# Patient Record
Sex: Male | Born: 1955 | Hispanic: No | Marital: Married | Smoking: Never smoker
Health system: Southern US, Community
[De-identification: ages and names within clinical notes are randomized; demographics above are authoritative.]

## PROBLEM LIST (undated history)

## (undated) DIAGNOSIS — S62109A Fracture of unspecified carpal bone, unspecified wrist, initial encounter for closed fracture: Secondary | ICD-10-CM

## (undated) DIAGNOSIS — Z955 Presence of coronary angioplasty implant and graft: Secondary | ICD-10-CM

## (undated) HISTORY — DX: Fracture of unspecified carpal bone, unspecified wrist, initial encounter for closed fracture: S62.109A

## (undated) HISTORY — DX: Presence of coronary angioplasty implant and graft: Z95.5

---

## 2008-03-21 ENCOUNTER — Encounter: Admission: RE | Admit: 2008-03-21 | Discharge: 2008-04-23 | Payer: Self-pay | Admitting: Neurology

## 2009-01-22 ENCOUNTER — Emergency Department (HOSPITAL_BASED_OUTPATIENT_CLINIC_OR_DEPARTMENT_OTHER): Admission: EM | Admit: 2009-01-22 | Discharge: 2009-01-22 | Payer: Self-pay | Admitting: Emergency Medicine

## 2009-01-22 ENCOUNTER — Ambulatory Visit: Payer: Self-pay | Admitting: Diagnostic Radiology

## 2009-01-22 IMAGING — CT CT ABD-PELV W/ CM
2 of 5 series · 17 of 46 positions shown, 19 images · IV contrast (APPLIED)
Comparison: None

CLINICAL DATA: Left-sided abdominal pain.

CT ABDOMEN AND PELVIS WITH CONTRAST
TECHNIQUE: Multidetector CT imaging of the abdomen and pelvis was
performed following the standard protocol during bolus
administration of intravenous contrast.
Contrast: 100 ml [48]

[Series 2: abd/pelvis 5.0 b31f · axial · 0.77mm/px · z∈[+848,+1298]mm · 14 of 102 slices shown, 16 images]
[im 6/102  soft-tissue]
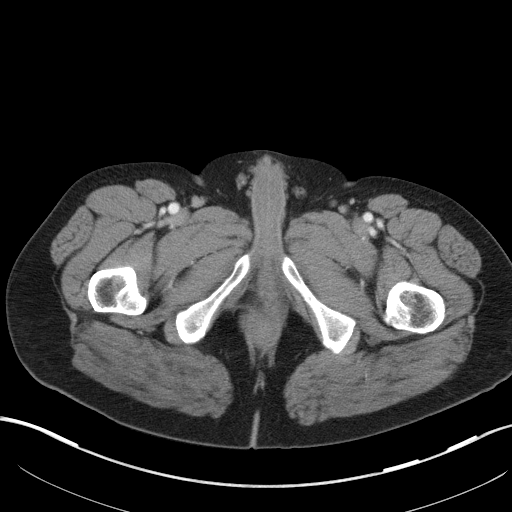
[im 6/102  bone]
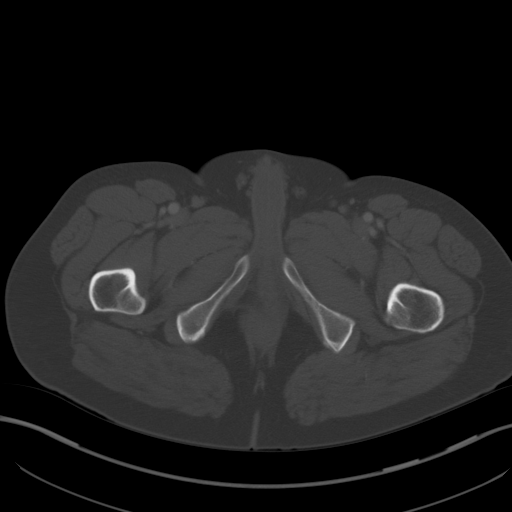
[im 12/102  soft-tissue]
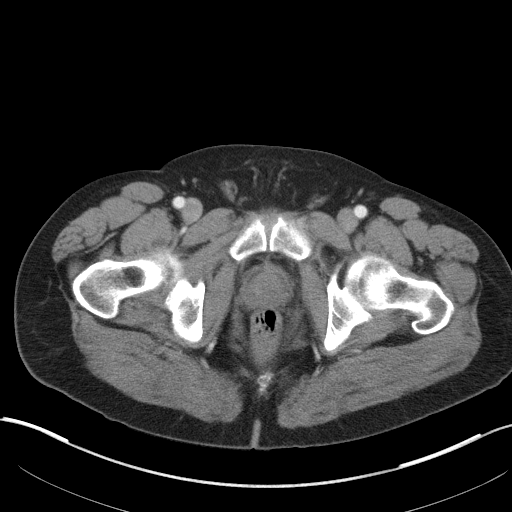
[im 23/102  soft-tissue]
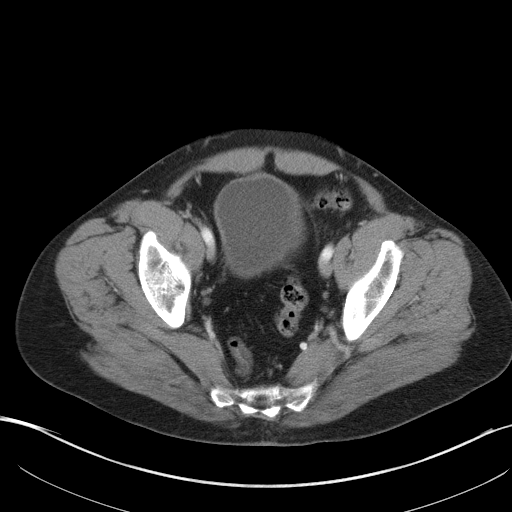
[im 29/102  soft-tissue]
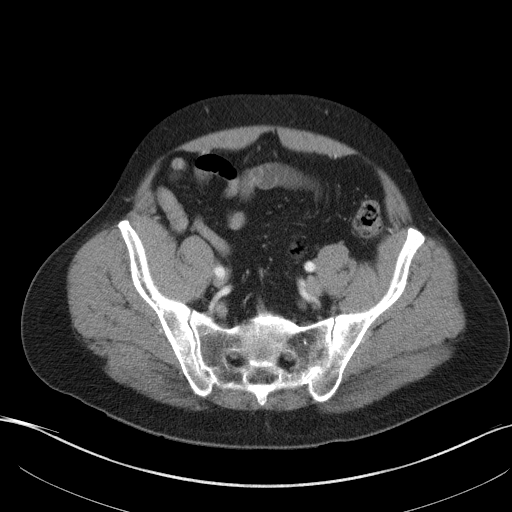
[im 34/102  soft-tissue]
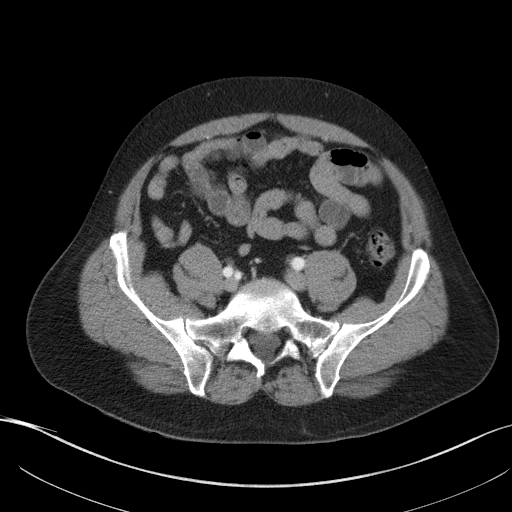
[im 40/102  soft-tissue]
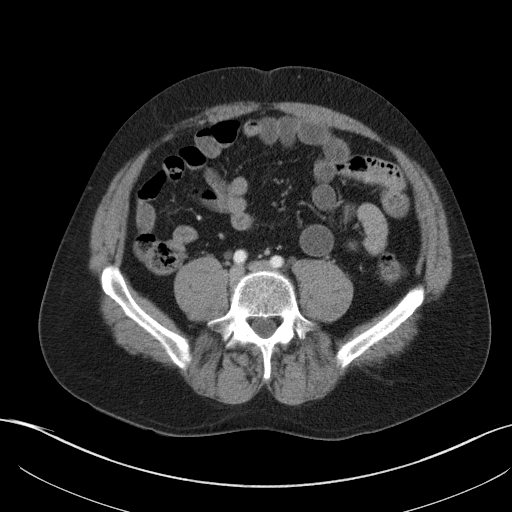
[im 45/102  soft-tissue]
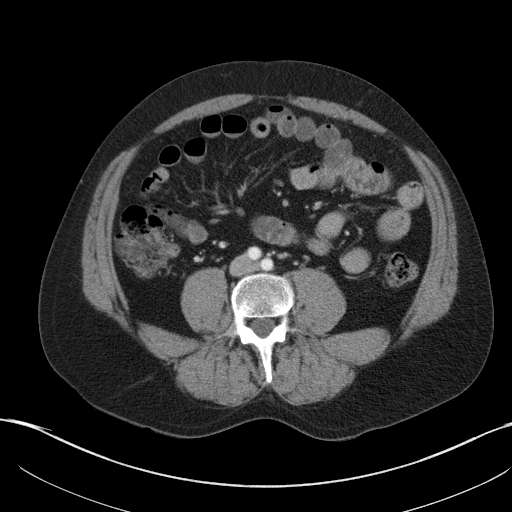
[im 57/102  soft-tissue]
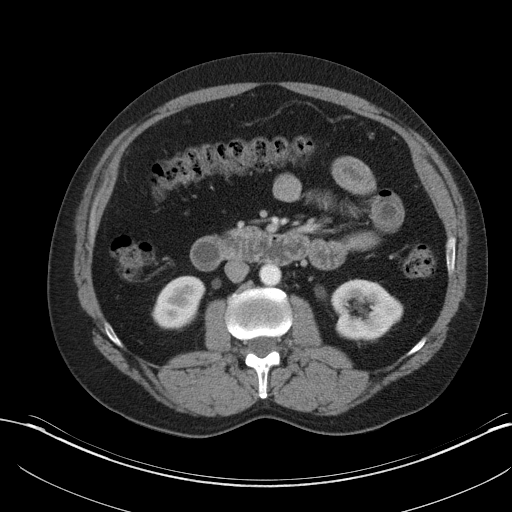
[im 62/102  soft-tissue]
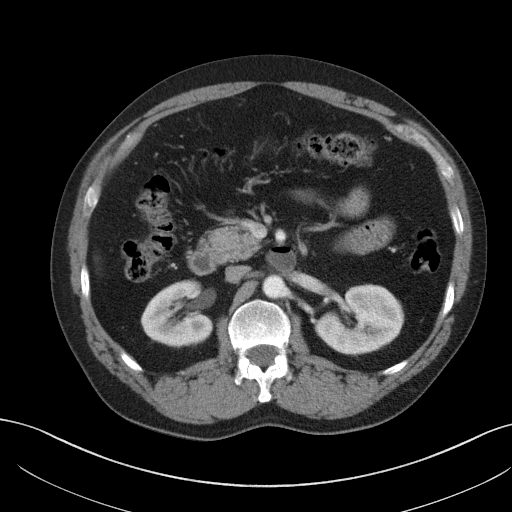
[im 62/102  bone]
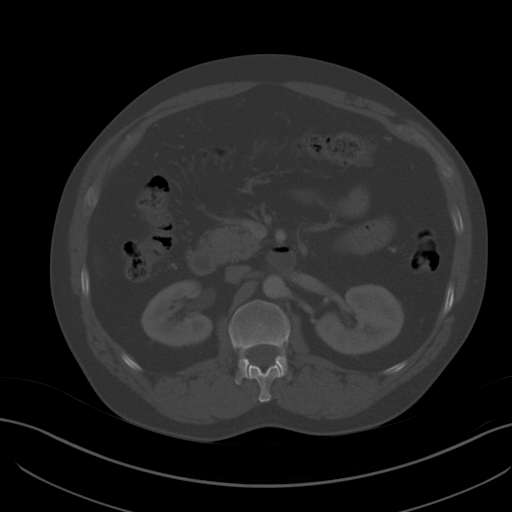
[im 68/102  soft-tissue]
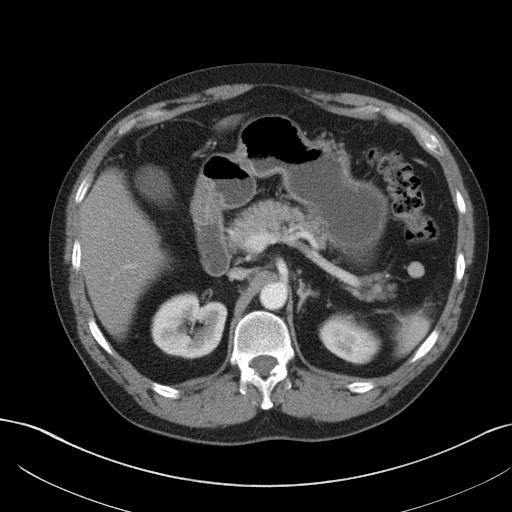
[im 73/102  soft-tissue]
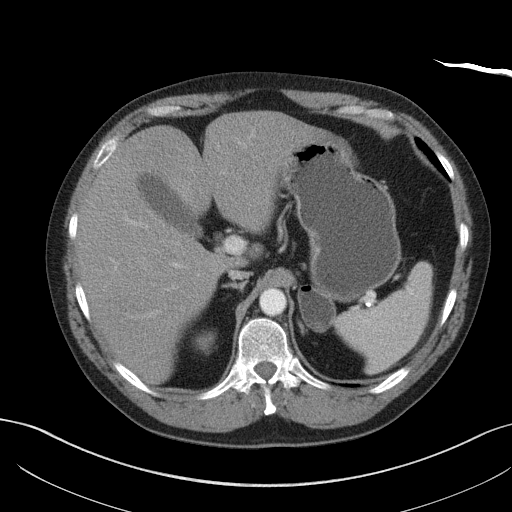
[im 79/102  soft-tissue]
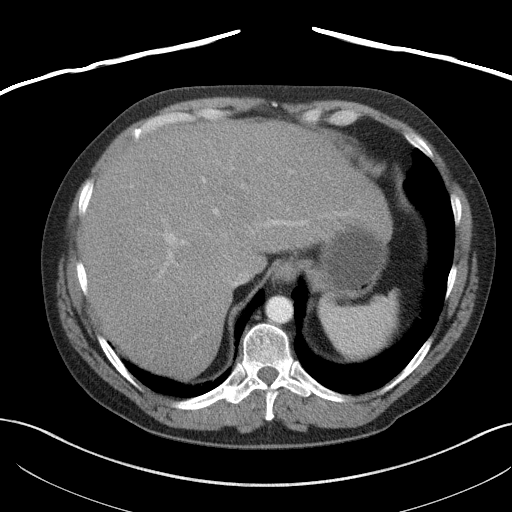
[im 90/102  soft-tissue]
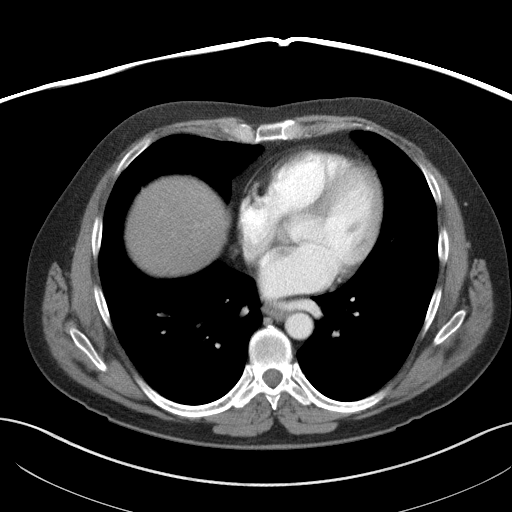
[im 96/102  soft-tissue]
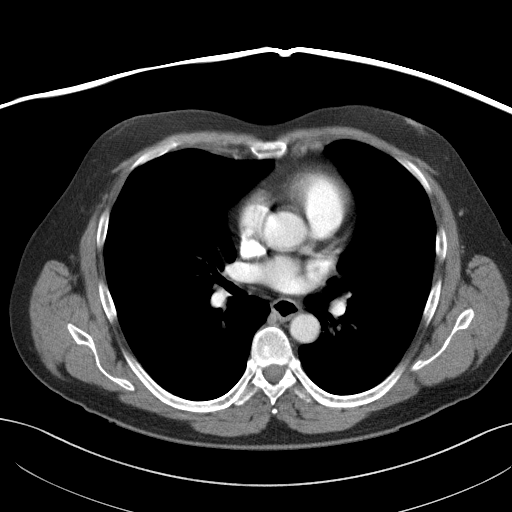

[Series 5: abd/pelvis 3.0 coronal · coronal · 0.91mm/px · 3 of 93 slices shown]
[im 31/93  soft-tissue]
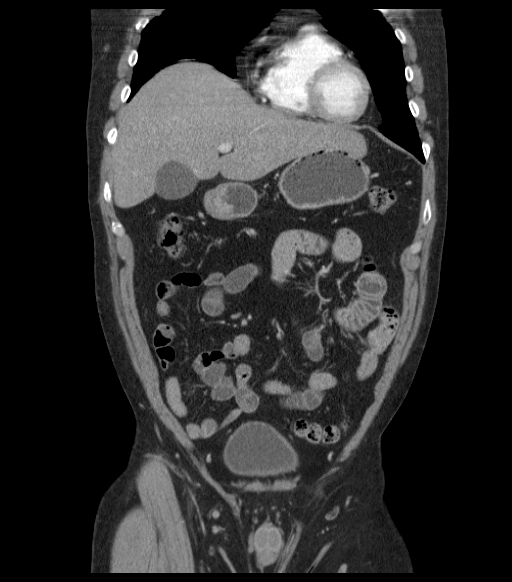
[im 41/93  soft-tissue]
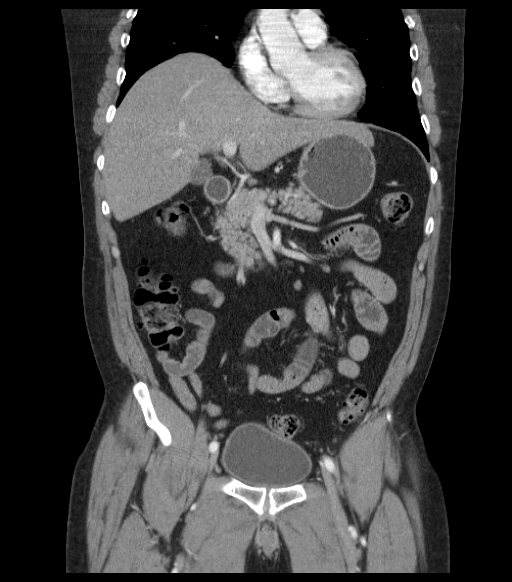
[im 52/93  soft-tissue]
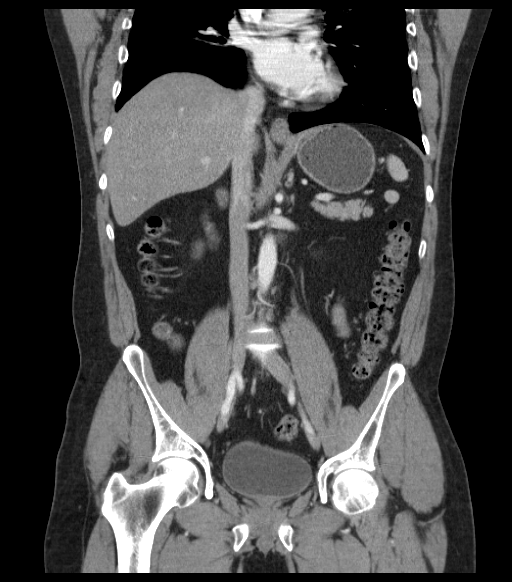

[17 of 46 positions shown; findings below may reference images not displayed]

FINDINGS: The liver, spleen, pancreas, adrenal glands, and kidneys
are normal.  There is no diverticular disease, free air, free
fluid, adenopathy, or bony abnormality.  No free fluid in the
pelvis.  The terminal ileum and appendix are normal.  There are no
renal or ureteral calculi.
IMPRESSION: Normal CT scan of the abdomen and pelvis.

## 2010-04-06 LAB — URINALYSIS, ROUTINE W REFLEX MICROSCOPIC
Glucose, UA: NEGATIVE mg/dL
Ketones, ur: NEGATIVE mg/dL
Nitrite: NEGATIVE
Specific Gravity, Urine: 1.003 — ABNORMAL LOW (ref 1.005–1.030)
pH: 5.5 (ref 5.0–8.0)

## 2010-04-06 LAB — BASIC METABOLIC PANEL
CO2: 21 mEq/L (ref 19–32)
Calcium: 9 mg/dL (ref 8.4–10.5)
Chloride: 105 mEq/L (ref 96–112)
Glucose, Bld: 80 mg/dL (ref 70–99)
Sodium: 139 mEq/L (ref 135–145)

## 2010-04-06 LAB — DIFFERENTIAL
Eosinophils Absolute: 0.2 10*3/uL (ref 0.0–0.7)
Eosinophils Relative: 2 % (ref 0–5)
Lymphs Abs: 1.9 10*3/uL (ref 0.7–4.0)
Monocytes Relative: 8 % (ref 3–12)

## 2010-04-06 LAB — CBC
HCT: 43.6 % (ref 39.0–52.0)
Platelets: 213 10*3/uL (ref 150–400)
RDW: 12.2 % (ref 11.5–15.5)

## 2015-01-08 DIAGNOSIS — N529 Male erectile dysfunction, unspecified: Secondary | ICD-10-CM | POA: Insufficient documentation

## 2015-01-08 DIAGNOSIS — D709 Neutropenia, unspecified: Secondary | ICD-10-CM | POA: Insufficient documentation

## 2015-01-08 DIAGNOSIS — I10 Essential (primary) hypertension: Secondary | ICD-10-CM | POA: Diagnosis present

## 2015-01-08 HISTORY — DX: Essential (primary) hypertension: I10

## 2016-10-13 DIAGNOSIS — R7303 Prediabetes: Secondary | ICD-10-CM | POA: Insufficient documentation

## 2017-09-21 DIAGNOSIS — E782 Mixed hyperlipidemia: Secondary | ICD-10-CM

## 2017-09-21 HISTORY — DX: Mixed hyperlipidemia: E78.2

## 2020-07-18 ENCOUNTER — Inpatient Hospital Stay (HOSPITAL_COMMUNITY)
Admission: EM | Admit: 2020-07-18 | Discharge: 2020-07-30 | DRG: 640 | Disposition: A | Discharge status: Home / Self Care | Payer: 59 | Attending: Family Medicine | Admitting: Family Medicine

## 2020-07-18 DIAGNOSIS — F1010 Alcohol abuse, uncomplicated: Secondary | ICD-10-CM | POA: Diagnosis present

## 2020-07-18 DIAGNOSIS — E782 Mixed hyperlipidemia: Secondary | ICD-10-CM

## 2020-07-18 DIAGNOSIS — K31811 Angiodysplasia of stomach and duodenum with bleeding: Secondary | ICD-10-CM | POA: Diagnosis present

## 2020-07-18 DIAGNOSIS — Z20822 Contact with and (suspected) exposure to covid-19: Secondary | ICD-10-CM | POA: Diagnosis present

## 2020-07-18 DIAGNOSIS — Z79899 Other long term (current) drug therapy: Secondary | ICD-10-CM

## 2020-07-18 DIAGNOSIS — D689 Coagulation defect, unspecified: Secondary | ICD-10-CM

## 2020-07-18 DIAGNOSIS — E871 Hypo-osmolality and hyponatremia: Principal | ICD-10-CM

## 2020-07-18 DIAGNOSIS — R945 Abnormal results of liver function studies: Secondary | ICD-10-CM

## 2020-07-18 DIAGNOSIS — D509 Iron deficiency anemia, unspecified: Secondary | ICD-10-CM | POA: Diagnosis present

## 2020-07-18 DIAGNOSIS — K7460 Unspecified cirrhosis of liver: Secondary | ICD-10-CM

## 2020-07-18 DIAGNOSIS — R7989 Other specified abnormal findings of blood chemistry: Secondary | ICD-10-CM

## 2020-07-18 DIAGNOSIS — K766 Portal hypertension: Secondary | ICD-10-CM | POA: Diagnosis present

## 2020-07-18 DIAGNOSIS — K317 Polyp of stomach and duodenum: Secondary | ICD-10-CM | POA: Diagnosis present

## 2020-07-18 DIAGNOSIS — K219 Gastro-esophageal reflux disease without esophagitis: Secondary | ICD-10-CM | POA: Diagnosis present

## 2020-07-18 DIAGNOSIS — E876 Hypokalemia: Secondary | ICD-10-CM | POA: Diagnosis not present

## 2020-07-18 DIAGNOSIS — I10 Essential (primary) hypertension: Secondary | ICD-10-CM | POA: Diagnosis present

## 2020-07-18 DIAGNOSIS — K3189 Other diseases of stomach and duodenum: Secondary | ICD-10-CM

## 2020-07-18 DIAGNOSIS — F411 Generalized anxiety disorder: Secondary | ICD-10-CM | POA: Diagnosis present

## 2020-07-18 DIAGNOSIS — D649 Anemia, unspecified: Secondary | ICD-10-CM

## 2020-07-18 DIAGNOSIS — E669 Obesity, unspecified: Secondary | ICD-10-CM | POA: Diagnosis present

## 2020-07-18 DIAGNOSIS — K7010 Alcoholic hepatitis without ascites: Secondary | ICD-10-CM | POA: Diagnosis present

## 2020-07-18 DIAGNOSIS — B0229 Other postherpetic nervous system involvement: Secondary | ICD-10-CM | POA: Diagnosis present

## 2020-07-18 DIAGNOSIS — K7200 Acute and subacute hepatic failure without coma: Secondary | ICD-10-CM | POA: Diagnosis present

## 2020-07-18 DIAGNOSIS — R932 Abnormal findings on diagnostic imaging of liver and biliary tract: Secondary | ICD-10-CM

## 2020-07-18 DIAGNOSIS — D500 Iron deficiency anemia secondary to blood loss (chronic): Secondary | ICD-10-CM | POA: Diagnosis present

## 2020-07-18 DIAGNOSIS — N400 Enlarged prostate without lower urinary tract symptoms: Secondary | ICD-10-CM | POA: Diagnosis present

## 2020-07-18 DIAGNOSIS — K7030 Alcoholic cirrhosis of liver without ascites: Secondary | ICD-10-CM

## 2020-07-18 DIAGNOSIS — E872 Acidosis: Secondary | ICD-10-CM | POA: Diagnosis present

## 2020-07-18 DIAGNOSIS — F17290 Nicotine dependence, other tobacco product, uncomplicated: Secondary | ICD-10-CM | POA: Diagnosis present

## 2020-07-18 DIAGNOSIS — Z6831 Body mass index (BMI) 31.0-31.9, adult: Secondary | ICD-10-CM

## 2020-07-18 LAB — ABO/RH: ABO/RH(D): O NEG

## 2020-07-18 LAB — COMPREHENSIVE METABOLIC PANEL
ALT: 350 U/L — ABNORMAL HIGH (ref 0–44)
AST: 572 U/L — ABNORMAL HIGH (ref 15–41)
Albumin: 2.8 g/dL — ABNORMAL LOW (ref 3.5–5.0)
Alkaline Phosphatase: 166 U/L — ABNORMAL HIGH (ref 38–126)
Anion gap: 11 (ref 5–15)
BUN: 7 mg/dL — ABNORMAL LOW (ref 8–23)
CO2: 18 mmol/L — ABNORMAL LOW (ref 22–32)
Calcium: 7.8 mg/dL — ABNORMAL LOW (ref 8.9–10.3)
Chloride: 90 mmol/L — ABNORMAL LOW (ref 98–111)
Creatinine, Ser: 0.87 mg/dL (ref 0.61–1.24)
GFR, Estimated: >60 mL/min (ref >60)
Glucose, Bld: 126 mg/dL — ABNORMAL HIGH (ref 70–99)
Potassium: 3 mmol/L — ABNORMAL LOW (ref 3.5–5.1)
Sodium: 119 mmol/L — CL (ref 135–145)
Total Bilirubin: 2 mg/dL — ABNORMAL HIGH (ref 0.3–1.2)
Total Protein: 6.1 g/dL — ABNORMAL LOW (ref 6.5–8.1)

## 2020-07-18 LAB — PREPARE RBC (CROSSMATCH)

## 2020-07-18 LAB — CBC
HCT: 22.5 % — ABNORMAL LOW (ref 39.0–52.0)
Hemoglobin: 6.6 g/dL — CL (ref 13.0–17.0)
MCH: 20.9 pg — ABNORMAL LOW (ref 26.0–34.0)
MCHC: 29.3 g/dL — ABNORMAL LOW (ref 30.0–36.0)
MCV: 71.2 fL — ABNORMAL LOW (ref 80.0–100.0)
Platelets: 154 10*3/uL (ref 150–400)
RBC: 3.16 MIL/uL — ABNORMAL LOW (ref 4.22–5.81)
RDW: 17.4 % — ABNORMAL HIGH (ref 11.5–15.5)
WBC: 6.1 10*3/uL (ref 4.0–10.5)
nRBC: 0 % (ref 0.0–0.2)

## 2020-07-18 LAB — POC OCCULT BLOOD, ED: Fecal Occult Bld: NEGATIVE

## 2020-07-18 MED ORDER — POTASSIUM CHLORIDE CRYS ER 20 MEQ PO TBCR
40.0000 meq | EXTENDED_RELEASE_TABLET | Freq: Once | ORAL | Status: AC
  Administered 2020-07-18: 40 meq via ORAL
  Filled 2020-07-18: qty 2

## 2020-07-18 MED ORDER — SODIUM CHLORIDE 0.9 % IV SOLN
10.0000 mL/h | Freq: Once | INTRAVENOUS | Status: AC
  Administered 2020-07-19: 10 mL/h via INTRAVENOUS

## 2020-07-18 NOTE — ED Provider Notes (Signed)
[facility] EMERGENCY DEPARTMENT Provider Note   CSN: [id number] Arrival date & time: 07/18/20  1943     History Chief Complaint  Patient presents with   Abnormal Lab    Kristopher Ramos is a 65 y.o. male.  The history is provided by the patient.  Abnormal Lab He had blood drawn from his physician's office today and was called because his hemoglobin is 6.6.  He has been having weakness, orthostatic dizziness, exertional dyspnea over the last month which has been getting worse.  Symptoms started after he had an episode of shingles.  He denies chest pain, heaviness, tightness, pressure.  He has had some anorexia and nausea but has not had any weight loss.  He denies any dark stools.  He denies NSAID use but he is a heavy drinker admitting to 6 beers a day.  He denies fever, chills, sweats.  He takes omeprazole and citalopram.  He had been on medication for blood pressure, but stopped taking it when he checked his blood pressure at home recently and it was 90 and he checked it the next day was 120.   No past medical history on file.  There are no problems to display for this patient.   ** The histories are not reviewed yet. Please review them in the "History" navigator section and refresh this [place].     No family history on file.     Home Medications Prior to Admission medications   Not on File    Allergies    Patient has no allergy information on record.  Review of Systems   Review of Systems  All other systems reviewed and are negative.  Physical Exam Updated Vital Signs BP 124/61 (BP Location: Left Arm)   Pulse 83   Temp 98.7 F (37.1 C) (Oral)   Resp (!) 22   SpO2 100%   Physical Exam Vitals and nursing note reviewed.  65 year old male, resting comfortably and in no acute distress. Vital signs are significant for slightly elevated respiratory rate. Oxygen saturation is 100%, which is normal. Head is normocephalic and atraumatic. PERRLA,  EOMI. Oropharynx is clear.  Conjunctivae are pale. Neck is nontender and supple without adenopathy or JVD. Back is nontender and there is no CVA tenderness. Lungs are clear without rales, wheezes, or rhonchi. Chest is nontender. Heart has regular rate and rhythm without murmur. Abdomen is soft, flat, nontender without masses or hepatosplenomegaly and peristalsis is normoactive. Rectal: Normal sphincter tone, small amount of light brown stool which is Hemoccult negative. Extremities have no cyanosis or edema, full range of motion is present. Skin is warm and dry without rash. Neurologic: Mental status is normal, cranial nerves are intact, there are no motor or sensory deficits.  ED Results / Procedures / Treatments   Labs (all labs ordered are listed, but only abnormal results are displayed) Labs Reviewed  COMPREHENSIVE METABOLIC PANEL - Abnormal; Notable for the following components:      Result Value   Sodium 119 (*)    Potassium 3.0 (*)    Chloride 90 (*)    CO2 18 (*)    Glucose, Bld 126 (*)    BUN 7 (*)    Calcium 7.8 (*)    Total Protein 6.1 (*)    Albumin 2.8 (*)    AST 572 (*)    ALT 350 (*)    Alkaline Phosphatase 166 (*)    Total Bilirubin 2.0 (*)    All other components  within normal limits  CBC - Abnormal; Notable for the following components:   RBC 3.16 (*)    Hemoglobin 6.6 (*)    HCT 22.5 (*)    MCV 71.2 (*)    MCH 20.9 (*)    MCHC 29.3 (*)    RDW 17.4 (*)    All other components within normal limits  RETICULOCYTES - Abnormal; Notable for the following components:   RBC. 3.18 (*)    Immature Retic Fract 23.0 (*)    All other components within normal limits  SARS CORONAVIRUS 2 (TAT 6-24 HRS)  VITAMIN B12  FOLATE  IRON AND TIBC  FERRITIN  HEPATITIS PANEL, ACUTE  POC OCCULT BLOOD, ED  TYPE AND SCREEN  ABO/RH  PREPARE RBC (CROSSMATCH)    EKG EKG Interpretation  Date/Time:  Thursday July 18 2020 23:26:02 EDT Ventricular Rate:  80 PR  Interval:  160 QRS Duration: 107 QT Interval:  426 QTC Calculation: 492 R Axis:   4 Text Interpretation: Sinus rhythm Atrial premature complexes Borderline prolonged QT interval No significant change was found Confirmed by Delora Fuel ([id number]) on 07/19/2020 12:21:15 AM  Radiology No results found.  Procedures Procedures  CRITICAL CARE Performed by: Delora Fuel Total critical care time: 60 minutes Critical care time was exclusive of separately billable procedures and treating other patients. Critical care was necessary to treat or prevent imminent or life-threatening deterioration. Critical care was time spent personally by me on the following activities: development of treatment plan with patient and/or surrogate as well as nursing, discussions with consultants, evaluation of patient's response to treatment, examination of patient, obtaining history from patient or surrogate, ordering and performing treatments and interventions, ordering and review of laboratory studies, ordering and review of radiographic studies, pulse oximetry and re-evaluation of patient's condition.  Medications Ordered in ED Medications - No data to display  ED Course  I have reviewed the triage vital signs and the nursing notes.  Pertinent labs & imaging results that were available during my care of the patient were reviewed by me and considered in my medical decision making (see chart for details).   MDM Rules/Calculators/A&P                         Symptomatic anemia.  Labs drawn at triage to confirm hemoglobin of 6.6 with microcytic and hyperchromic indices suggesting iron deficiency.  However, there are significant abnormalities on his metabolic panel as well.  Sodium is 119, alkaline phosphatase 166, AST 572, ALT 350, bilirubin 2.0.  Last prior labs in our system or from more than 10 years ago.  However, in care everywhere, it is noted he had a hemoglobin of 13.0 on 01/22/2020.  Prior to that, hemoglobin had been  15.3 on 09/21/2017.  He has had mild hyponatremia with sodium 128-131 going back to 2018.  He has also had mild elevation of transaminases going back to 2018, but nothing higher than 74.  Blood is ordered for transfusion and also for anemia panel.  Hepatitis panel was drawn.  ECG shows borderline prolonged QT interval.  Case is discussed with Dr. Cyd Silence of [facility], who agrees to admit the patient.  Final Clinical Impression(s) / ED Diagnoses Final diagnoses:  Symptomatic anemia  Elevated liver function tests  Hyponatremia  Hypokalemia    Rx / DC Orders ED Discharge Orders     None        Delora Fuel, MD 18/84/16 [phone]

## 2020-07-18 NOTE — ED Triage Notes (Signed)
Pt c/o dizziness, lightheaded, "problems seeing," states Hgn was 6.6 at PCP this AM, but unsure of source. Endorses nausea, no additional complaints. States he feels "crummy"

## 2020-07-18 NOTE — ED Triage Notes (Signed)
Pt stated dx w shingles approx 7wks ago, symptoms of low Hgn started approx 4wks ago.

## 2020-07-19 ENCOUNTER — Other Ambulatory Visit: Payer: Self-pay

## 2020-07-19 ENCOUNTER — Inpatient Hospital Stay (HOSPITAL_COMMUNITY): Payer: 59

## 2020-07-19 ENCOUNTER — Encounter (HOSPITAL_COMMUNITY)
Admission: EM | Disposition: A | Discharge status: Home / Self Care | Payer: Self-pay | Source: Home / Self Care | Attending: Family Medicine

## 2020-07-19 ENCOUNTER — Inpatient Hospital Stay (HOSPITAL_COMMUNITY): Payer: 59 | Admitting: Certified Registered Nurse Anesthetist

## 2020-07-19 ENCOUNTER — Encounter (HOSPITAL_COMMUNITY): Payer: Self-pay | Admitting: Internal Medicine

## 2020-07-19 DIAGNOSIS — Z79899 Other long term (current) drug therapy: Secondary | ICD-10-CM | POA: Diagnosis not present

## 2020-07-19 DIAGNOSIS — F1010 Alcohol abuse, uncomplicated: Secondary | ICD-10-CM | POA: Diagnosis present

## 2020-07-19 DIAGNOSIS — D689 Coagulation defect, unspecified: Secondary | ICD-10-CM | POA: Diagnosis not present

## 2020-07-19 DIAGNOSIS — R932 Abnormal findings on diagnostic imaging of liver and biliary tract: Secondary | ICD-10-CM | POA: Diagnosis not present

## 2020-07-19 DIAGNOSIS — K7030 Alcoholic cirrhosis of liver without ascites: Secondary | ICD-10-CM | POA: Diagnosis present

## 2020-07-19 DIAGNOSIS — R945 Abnormal results of liver function studies: Secondary | ICD-10-CM | POA: Diagnosis not present

## 2020-07-19 DIAGNOSIS — I10 Essential (primary) hypertension: Secondary | ICD-10-CM | POA: Diagnosis present

## 2020-07-19 DIAGNOSIS — D500 Iron deficiency anemia secondary to blood loss (chronic): Secondary | ICD-10-CM | POA: Diagnosis present

## 2020-07-19 DIAGNOSIS — B0229 Other postherpetic nervous system involvement: Secondary | ICD-10-CM | POA: Diagnosis present

## 2020-07-19 DIAGNOSIS — E782 Mixed hyperlipidemia: Secondary | ICD-10-CM | POA: Diagnosis present

## 2020-07-19 DIAGNOSIS — Z6831 Body mass index (BMI) 31.0-31.9, adult: Secondary | ICD-10-CM | POA: Diagnosis not present

## 2020-07-19 DIAGNOSIS — K219 Gastro-esophageal reflux disease without esophagitis: Secondary | ICD-10-CM

## 2020-07-19 DIAGNOSIS — D649 Anemia, unspecified: Secondary | ICD-10-CM

## 2020-07-19 DIAGNOSIS — D509 Iron deficiency anemia, unspecified: Secondary | ICD-10-CM | POA: Diagnosis present

## 2020-07-19 DIAGNOSIS — E871 Hypo-osmolality and hyponatremia: Principal | ICD-10-CM | POA: Diagnosis present

## 2020-07-19 DIAGNOSIS — E872 Acidosis: Secondary | ICD-10-CM | POA: Diagnosis present

## 2020-07-19 DIAGNOSIS — K31819 Angiodysplasia of stomach and duodenum without bleeding: Secondary | ICD-10-CM | POA: Diagnosis not present

## 2020-07-19 DIAGNOSIS — K317 Polyp of stomach and duodenum: Secondary | ICD-10-CM

## 2020-07-19 DIAGNOSIS — N400 Enlarged prostate without lower urinary tract symptoms: Secondary | ICD-10-CM | POA: Diagnosis present

## 2020-07-19 DIAGNOSIS — R7989 Other specified abnormal findings of blood chemistry: Secondary | ICD-10-CM | POA: Diagnosis not present

## 2020-07-19 DIAGNOSIS — K3189 Other diseases of stomach and duodenum: Secondary | ICD-10-CM

## 2020-07-19 DIAGNOSIS — Z20822 Contact with and (suspected) exposure to covid-19: Secondary | ICD-10-CM | POA: Diagnosis present

## 2020-07-19 DIAGNOSIS — E876 Hypokalemia: Secondary | ICD-10-CM | POA: Diagnosis present

## 2020-07-19 DIAGNOSIS — K31811 Angiodysplasia of stomach and duodenum with bleeding: Secondary | ICD-10-CM | POA: Diagnosis present

## 2020-07-19 DIAGNOSIS — K7010 Alcoholic hepatitis without ascites: Secondary | ICD-10-CM | POA: Diagnosis not present

## 2020-07-19 DIAGNOSIS — F411 Generalized anxiety disorder: Secondary | ICD-10-CM | POA: Diagnosis present

## 2020-07-19 DIAGNOSIS — K766 Portal hypertension: Secondary | ICD-10-CM | POA: Diagnosis present

## 2020-07-19 DIAGNOSIS — F17290 Nicotine dependence, other tobacco product, uncomplicated: Secondary | ICD-10-CM | POA: Diagnosis present

## 2020-07-19 DIAGNOSIS — K7200 Acute and subacute hepatic failure without coma: Secondary | ICD-10-CM | POA: Diagnosis present

## 2020-07-19 DIAGNOSIS — E669 Obesity, unspecified: Secondary | ICD-10-CM | POA: Diagnosis present

## 2020-07-19 HISTORY — DX: Alcohol abuse, uncomplicated: F10.10

## 2020-07-19 HISTORY — DX: Gastro-esophageal reflux disease without esophagitis: K21.9

## 2020-07-19 HISTORY — PX: GI RADIOFREQUENCY ABLATION: SHX6807

## 2020-07-19 HISTORY — PX: ESOPHAGOGASTRODUODENOSCOPY (EGD) WITH PROPOFOL: SHX5813

## 2020-07-19 HISTORY — PX: POLYPECTOMY: SHX5525

## 2020-07-19 HISTORY — DX: Generalized anxiety disorder: F41.1

## 2020-07-19 HISTORY — PX: BIOPSY: SHX5522

## 2020-07-19 LAB — TSH: TSH: 4.825 u[IU]/mL — ABNORMAL HIGH (ref 0.350–4.500)

## 2020-07-19 LAB — BASIC METABOLIC PANEL
Anion gap: 9 (ref 5–15)
BUN: 7 mg/dL — ABNORMAL LOW (ref 8–23)
CO2: 22 mmol/L (ref 22–32)
Calcium: 8.5 mg/dL — ABNORMAL LOW (ref 8.9–10.3)
Chloride: 101 mmol/L (ref 98–111)
Creatinine, Ser: 0.8 mg/dL (ref 0.61–1.24)
GFR, Estimated: >60 mL/min (ref >60)
Glucose, Bld: 122 mg/dL — ABNORMAL HIGH (ref 70–99)
Potassium: 3.9 mmol/L (ref 3.5–5.1)
Sodium: 132 mmol/L — ABNORMAL LOW (ref 135–145)

## 2020-07-19 LAB — CBC
HCT: 26.7 % — ABNORMAL LOW (ref 39.0–52.0)
HCT: 29 % — ABNORMAL LOW (ref 39.0–52.0)
Hemoglobin: 8.4 g/dL — ABNORMAL LOW (ref 13.0–17.0)
Hemoglobin: 9.2 g/dL — ABNORMAL LOW (ref 13.0–17.0)
MCH: 23.1 pg — ABNORMAL LOW (ref 26.0–34.0)
MCH: 23.3 pg — ABNORMAL LOW (ref 26.0–34.0)
MCHC: 31.5 g/dL (ref 30.0–36.0)
MCHC: 31.7 g/dL (ref 30.0–36.0)
MCV: 73.6 fL — ABNORMAL LOW (ref 80.0–100.0)
MCV: 73.4 fL — ABNORMAL LOW (ref 80.0–100.0)
Platelets: 147 10*3/uL — ABNORMAL LOW (ref 150–400)
Platelets: 161 10*3/uL (ref 150–400)
RBC: 3.63 MIL/uL — ABNORMAL LOW (ref 4.22–5.81)
RBC: 3.95 MIL/uL — ABNORMAL LOW (ref 4.22–5.81)
RDW: 19.6 % — ABNORMAL HIGH (ref 11.5–15.5)
RDW: 19.1 % — ABNORMAL HIGH (ref 11.5–15.5)
WBC: 6.1 10*3/uL (ref 4.0–10.5)
WBC: 6 10*3/uL (ref 4.0–10.5)
nRBC: 0 % (ref 0.0–0.2)
nRBC: 0 % (ref 0.0–0.2)

## 2020-07-19 LAB — COMPREHENSIVE METABOLIC PANEL
ALT: 356 U/L — ABNORMAL HIGH (ref 0–44)
AST: 582 U/L — ABNORMAL HIGH (ref 15–41)
Albumin: 2.8 g/dL — ABNORMAL LOW (ref 3.5–5.0)
Alkaline Phosphatase: 164 U/L — ABNORMAL HIGH (ref 38–126)
Anion gap: 9 (ref 5–15)
BUN: 7 mg/dL — ABNORMAL LOW (ref 8–23)
CO2: 21 mmol/L — ABNORMAL LOW (ref 22–32)
Calcium: 8.3 mg/dL — ABNORMAL LOW (ref 8.9–10.3)
Chloride: 97 mmol/L — ABNORMAL LOW (ref 98–111)
Creatinine, Ser: 0.76 mg/dL (ref 0.61–1.24)
GFR, Estimated: >60 mL/min (ref >60)
Glucose, Bld: 120 mg/dL — ABNORMAL HIGH (ref 70–99)
Potassium: 3.8 mmol/L (ref 3.5–5.1)
Sodium: 127 mmol/L — ABNORMAL LOW (ref 135–145)
Total Bilirubin: 3.2 mg/dL — ABNORMAL HIGH (ref 0.3–1.2)
Total Protein: 6.1 g/dL — ABNORMAL LOW (ref 6.5–8.1)

## 2020-07-19 LAB — FOLATE: Folate: 21.3 ng/mL (ref >5.9)

## 2020-07-19 LAB — HIV ANTIBODY (ROUTINE TESTING W REFLEX): HIV Screen 4th Generation wRfx: NONREACTIVE

## 2020-07-19 LAB — HEPATITIS PANEL, ACUTE
HCV Ab: NONREACTIVE
Hep A IgM: NONREACTIVE
Hep B C IgM: NONREACTIVE
Hepatitis B Surface Ag: NONREACTIVE

## 2020-07-19 LAB — IRON AND TIBC
Iron: 13 ug/dL — ABNORMAL LOW (ref 45–182)
Saturation Ratios: 3 % — ABNORMAL LOW (ref 17.9–39.5)
TIBC: 407 ug/dL (ref 250–450)
UIBC: 394 ug/dL

## 2020-07-19 LAB — RETICULOCYTES
Immature Retic Fract: 23 % — ABNORMAL HIGH (ref 2.3–15.9)
RBC.: 3.18 MIL/uL — ABNORMAL LOW (ref 4.22–5.81)
Retic Count, Absolute: 65.8 10*3/uL (ref 19.0–186.0)
Retic Ct Pct: 2.1 % (ref 0.4–3.1)

## 2020-07-19 LAB — FERRITIN: Ferritin: 22 ng/mL — ABNORMAL LOW (ref 24–336)

## 2020-07-19 LAB — VITAMIN B12: Vitamin B-12: 1478 pg/mL — ABNORMAL HIGH (ref 180–914)

## 2020-07-19 LAB — APTT: aPTT: 34 seconds (ref 24–36)

## 2020-07-19 LAB — OSMOLALITY, URINE: Osmolality, Ur: 191 mOsm/kg — ABNORMAL LOW (ref 300–900)

## 2020-07-19 LAB — PROTIME-INR
INR: 1.4 — ABNORMAL HIGH (ref 0.8–1.2)
Prothrombin Time: 17.1 seconds — ABNORMAL HIGH (ref 11.4–15.2)

## 2020-07-19 LAB — SODIUM, URINE, RANDOM: Sodium, Ur: <10 mmol/L

## 2020-07-19 LAB — SARS CORONAVIRUS 2 (TAT 6-24 HRS): SARS Coronavirus 2: NEGATIVE

## 2020-07-19 LAB — OSMOLALITY: Osmolality: 268 mOsm/kg — ABNORMAL LOW (ref 275–295)

## 2020-07-19 IMAGING — US US ABDOMEN LIMITED
1 series · 14 of 25 positions shown · non-contrast
Comparison: None.

CLINICAL DATA: Cirrhosis

EXAM:
ULTRASOUND ABDOMEN LIMITED RIGHT UPPER QUADRANT

[Series 1: us abdomen limited ruq (liver/gb) · 14 of 44 slices shown]
[im 1/44]
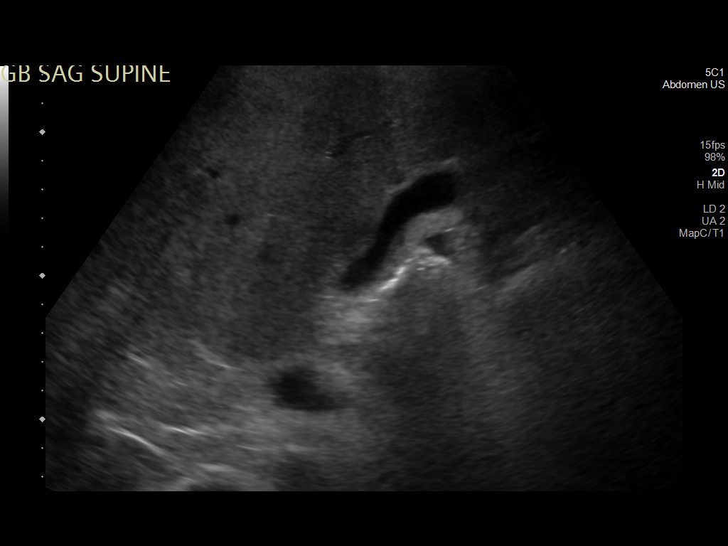
[im 4/44]
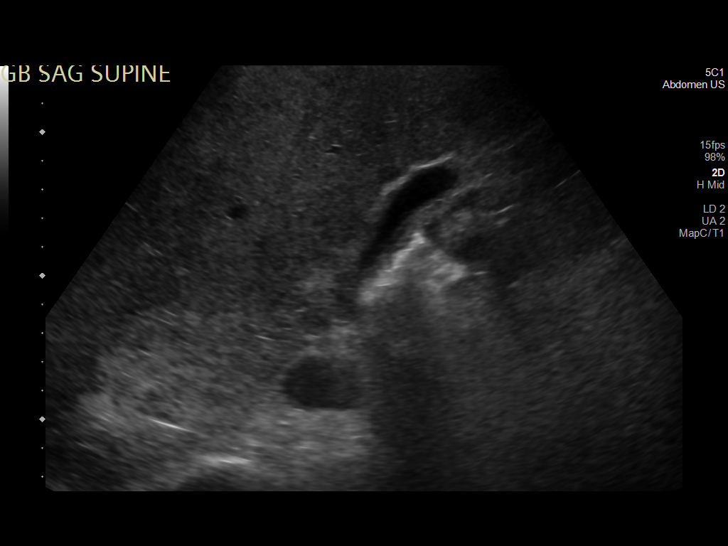
[im 8/44]
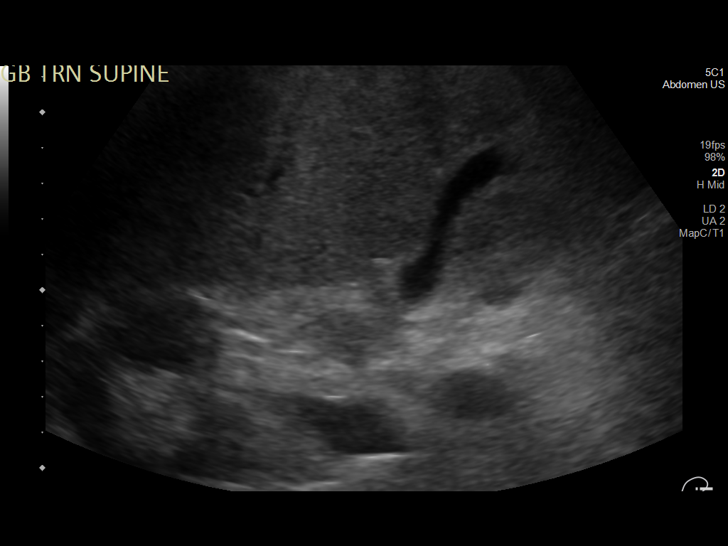
[im 11/44]
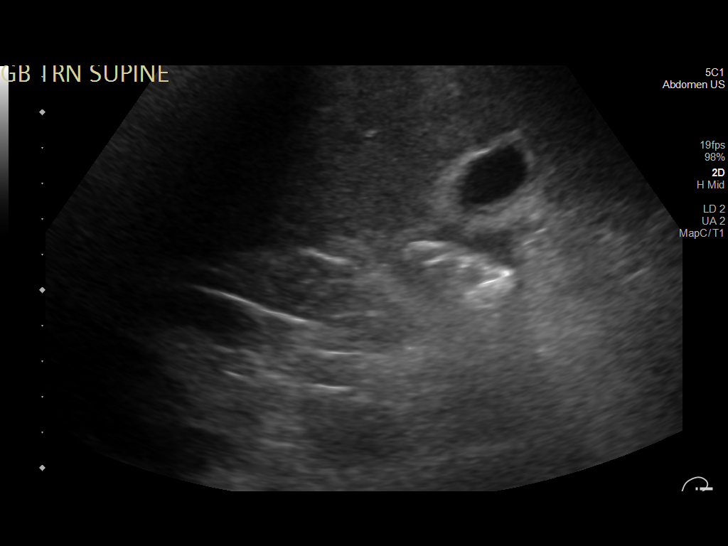
[im 15/44]
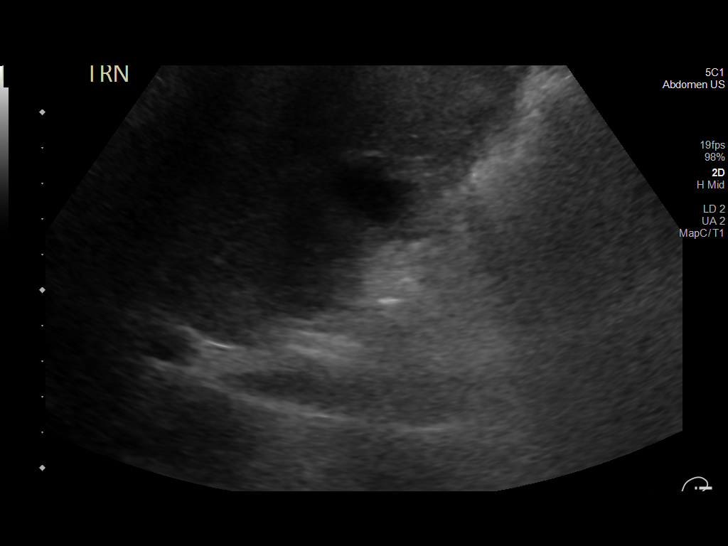
[im 17/44]
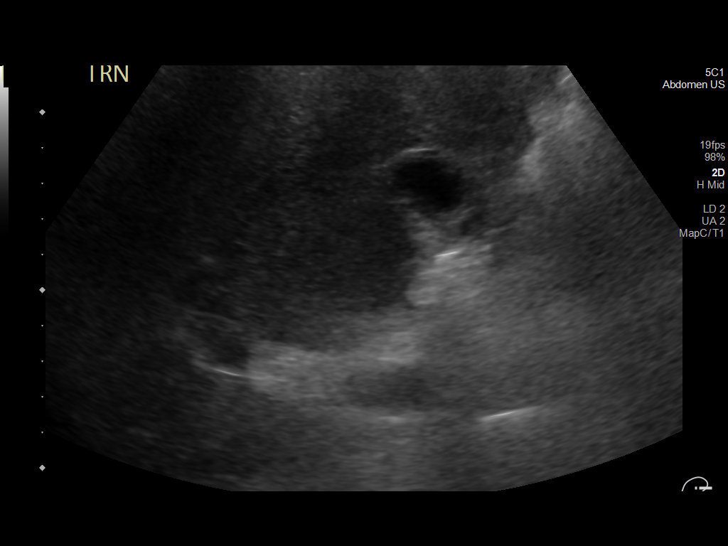
[im 20/44]
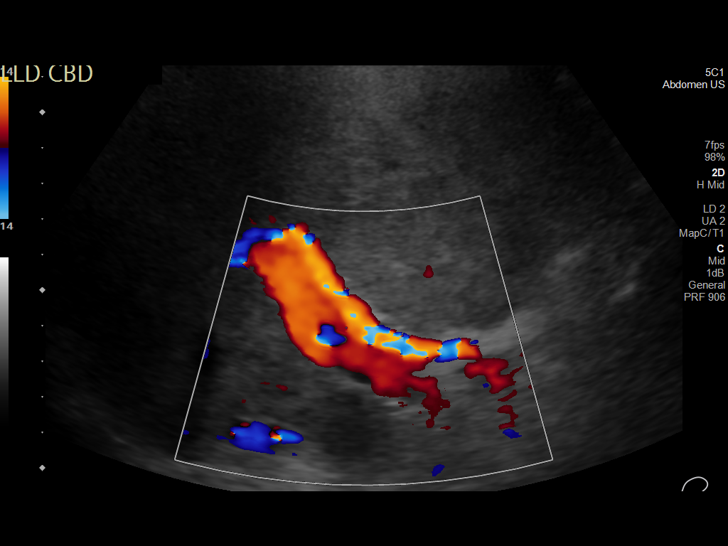
[im 24/44]
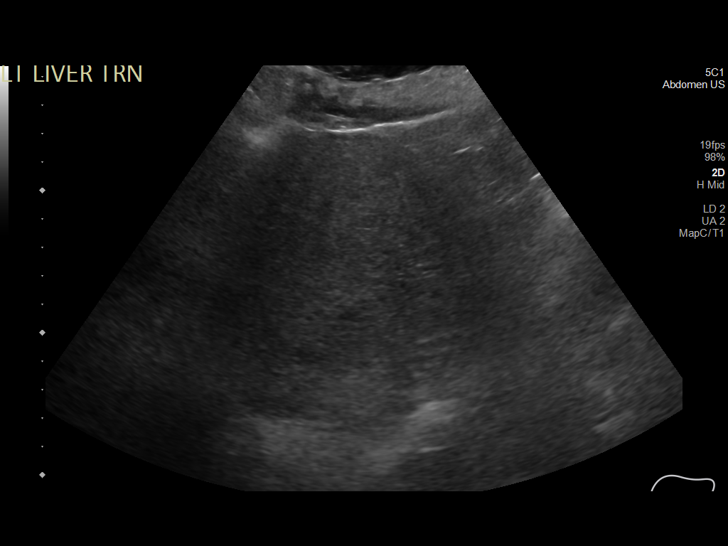
[im 27/44]
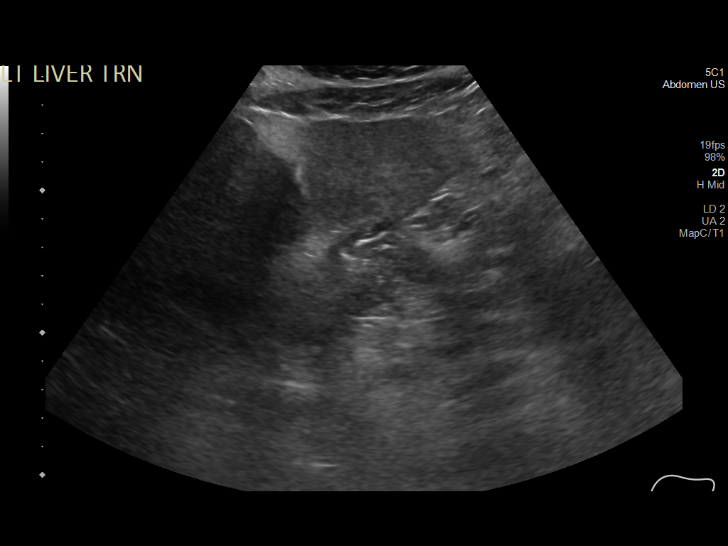
[im 29/44]
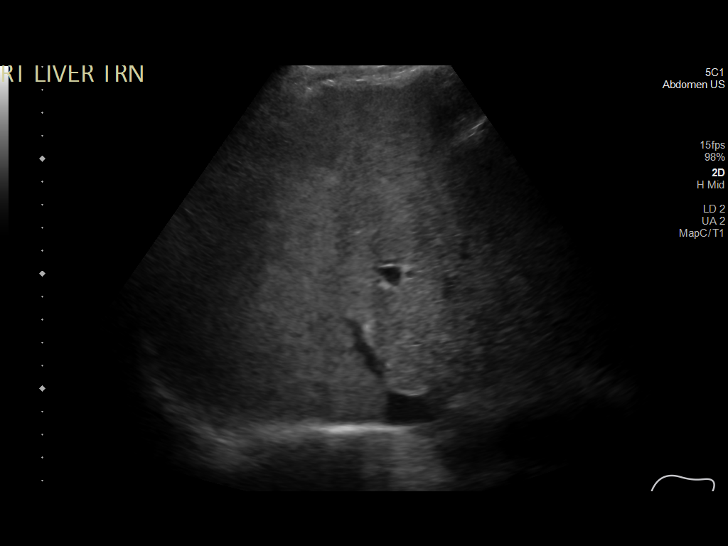
[im 33/44]
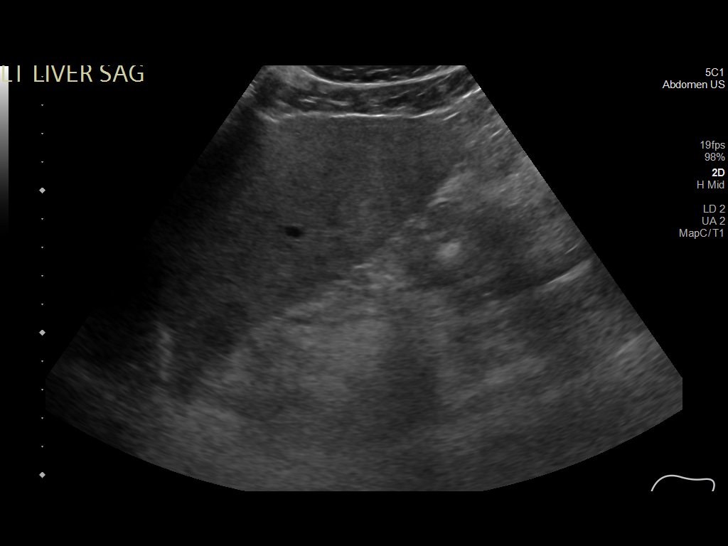
[im 36/44]
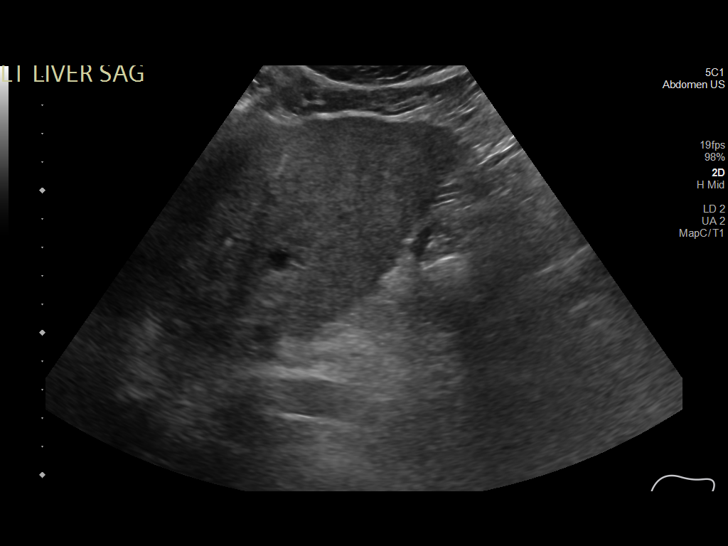
[im 40/44]
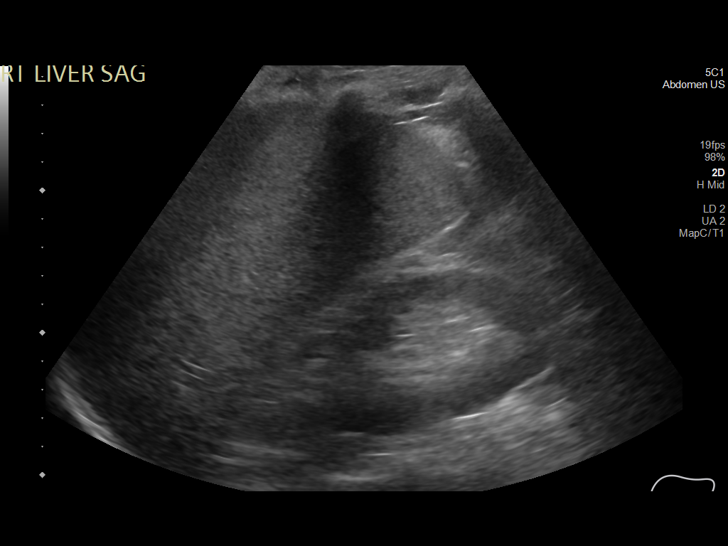
[im 44/44]
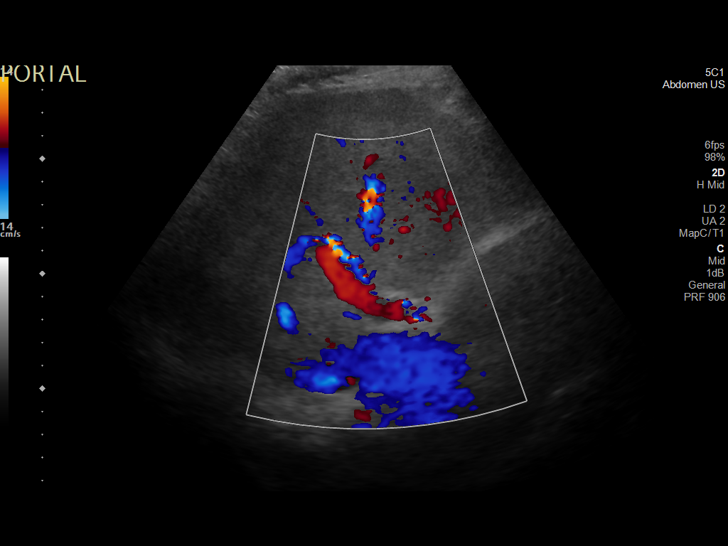

[14 of 25 positions shown; findings below may reference images not displayed]

FINDINGS: Gallbladder:

No gallstones or wall thickening visualized. No sonographic Murphy
sign noted by sonographer.

Common bile duct:

Diameter: 2 mm in proximal diameter

Liver:

The liver contour is nodular in keeping with changes of cirrhosis.
No focal intrahepatic masses identified. No intrahepatic biliary
ductal dilation. Liver size is within normal limits. Portal vein is
patent on color Doppler imaging with normal direction of blood flow
towards the liver.

Other: No ascites
IMPRESSION: Cirrhotic change of the liver. No focal intrahepatic mass
identified.

## 2020-07-19 SURGERY — ESOPHAGOGASTRODUODENOSCOPY (EGD) WITH PROPOFOL
Anesthesia: Monitor Anesthesia Care

## 2020-07-19 MED ORDER — LORAZEPAM 1 MG PO TABS: 1.0000 mg | ORAL_TABLET | ORAL | Status: DC | PRN

## 2020-07-19 MED ORDER — THIAMINE HCL 100 MG/ML IJ SOLN
100.0000 mg | Freq: Every day | INTRAMUSCULAR | Status: DC
  Administered 2020-07-25: 100 mg via INTRAVENOUS
  Filled 2020-07-19 (×3): qty 2

## 2020-07-19 MED ORDER — LACTATED RINGERS IV SOLN: INTRAVENOUS | Status: DC | PRN

## 2020-07-19 MED ORDER — ONDANSETRON HCL 4 MG PO TABS: 4.0000 mg | ORAL_TABLET | Freq: Four times a day (QID) | ORAL | Status: DC | PRN

## 2020-07-19 MED ORDER — PANTOPRAZOLE SODIUM 40 MG IV SOLR
40.0000 mg | Freq: Two times a day (BID) | INTRAVENOUS | Status: DC
  Administered 2020-07-19 – 2020-07-21 (×4): 40 mg via INTRAVENOUS
  Filled 2020-07-19 (×4): qty 40

## 2020-07-19 MED ORDER — LORAZEPAM 2 MG/ML IJ SOLN: 1.0000 mg | INTRAMUSCULAR | Status: DC | PRN

## 2020-07-19 MED ORDER — PROPOFOL 500 MG/50ML IV EMUL
INTRAVENOUS | Status: DC | PRN
  Administered 2020-07-19: 100 ug/kg/min via INTRAVENOUS
  Administered 2020-07-19: 125 ug/kg/min via INTRAVENOUS

## 2020-07-19 MED ORDER — CITALOPRAM HYDROBROMIDE 20 MG PO TABS
20.0000 mg | ORAL_TABLET | Freq: Every day | ORAL | Status: DC
  Administered 2020-07-19 – 2020-07-30 (×12): 20 mg via ORAL
  Filled 2020-07-19 (×12): qty 1

## 2020-07-19 MED ORDER — PANTOPRAZOLE 80MG IVPB - SIMPLE MED
80.0000 mg | Freq: Once | INTRAVENOUS | Status: AC
  Administered 2020-07-19: 80 mg via INTRAVENOUS
  Filled 2020-07-19: qty 80

## 2020-07-19 MED ORDER — FOLIC ACID 1 MG PO TABS
1.0000 mg | ORAL_TABLET | Freq: Every day | ORAL | Status: DC
  Administered 2020-07-19 – 2020-07-30 (×12): 1 mg via ORAL
  Filled 2020-07-19 (×12): qty 1

## 2020-07-19 MED ORDER — ADULT MULTIVITAMIN W/MINERALS CH
1.0000 | ORAL_TABLET | Freq: Every day | ORAL | Status: DC
  Administered 2020-07-19 – 2020-07-24 (×6): 1 via ORAL
  Filled 2020-07-19 (×6): qty 1

## 2020-07-19 MED ORDER — PHENYLEPHRINE HCL-NACL 10-0.9 MG/250ML-% IV SOLN
INTRAVENOUS | Status: DC | PRN
  Administered 2020-07-19: 25 ug/min via INTRAVENOUS

## 2020-07-19 MED ORDER — THIAMINE HCL 100 MG PO TABS
100.0000 mg | ORAL_TABLET | Freq: Every day | ORAL | Status: DC
  Administered 2020-07-19 – 2020-07-30 (×11): 100 mg via ORAL
  Filled 2020-07-19 (×12): qty 1

## 2020-07-19 MED ORDER — PHENYLEPHRINE 40 MCG/ML (10ML) SYRINGE FOR IV PUSH (FOR BLOOD PRESSURE SUPPORT)
PREFILLED_SYRINGE | INTRAVENOUS | Status: DC | PRN
  Administered 2020-07-19 (×3): 80 ug via INTRAVENOUS

## 2020-07-19 MED ORDER — ONDANSETRON HCL 4 MG/2ML IJ SOLN: 4.0000 mg | Freq: Four times a day (QID) | INTRAMUSCULAR | Status: DC | PRN

## 2020-07-19 MED ORDER — ATORVASTATIN CALCIUM 10 MG PO TABS: 20.0000 mg | ORAL_TABLET | Freq: Every day | ORAL | Status: DC

## 2020-07-19 MED ORDER — PROPOFOL 10 MG/ML IV BOLUS
INTRAVENOUS | Status: DC | PRN
  Administered 2020-07-19: 50 mg via INTRAVENOUS
  Administered 2020-07-19: 20 mg via INTRAVENOUS

## 2020-07-19 SURGICAL SUPPLY — 14 items

## 2020-07-19 NOTE — Anesthesia Procedure Notes (Signed)
Procedure Name: MAC Date/Time: 07/19/2020 11:43 AM Performed by: Fulton Reek, CRNA Pre-anesthesia Checklist: Patient identified, Emergency Drugs available, Suction available and Patient being monitored Oxygen Delivery Method: Nasal cannula Dental Injury: Teeth and Oropharynx as per pre-operative assessment

## 2020-07-19 NOTE — Progress Notes (Signed)
PROGRESS NOTE  Kristopher Ramos GDJ:[id number] DOB: May 02, 1955   PCP: Kristopher Glee., MD  Patient is from: Home.  DOA: 07/18/2020 LOS: 0  Chief complaints:  Chief Complaint  Patient presents with   Abnormal Lab     Brief Narrative / Interim history: 65 year old M with PMH of EtOH abuse, anxiety, HTN, HLD, recent diagnosis of herpes zoster on 5/16 for which he was treated, and GERD presenting with generalized weakness for 4 weeks, dyspnea on exertion and orthostasis, and admitted for symptomatic anemia with Hgb of 6.6 from baseline of 13.  Also hyponatremic to 119 with elevated LFT.  He drinks about 6 beers a day.  Has been using about 4 tablets of ibuprofen daily for herpes zoster pain.  No report of melena or hematochezia.  Hemoccult was negative in ED.  2 units of blood ordered.  GI consulted.  RUQ ultrasound concerning for liver cirrhosis.  Subjective: Seen and examined earlier this morning before he went down for EGD.  No major events overnight of this morning.  No complaints other than feeling sleepy.  He denies chest pain, dyspnea, nausea, vomiting, abdominal pain, melena or hematochezia.  Objective: Vitals:   07/19/20 1228 07/19/20 1238 07/19/20 1240 07/19/20 1250  BP: (!) 105/44 116/65 116/65 122/66  Pulse: 63 66 67 69  Resp: 16 (!) 21 20 16   Temp: 98.1 F (36.7 C)     TempSrc: Oral     SpO2: 99% 100% 100% 100%  Weight:      Height:        Intake/Output Summary (Last 24 hours) at 07/19/2020 1256 Last data filed at 07/19/2020 1218 Gross per 24 hour  Intake 1943.5 ml  Output --  Net 1943.5 ml   Filed Weights   07/19/20 0012  Weight: 89.8 kg    Examination:  GENERAL: No apparent distress.  Nontoxic. HEENT: MMM.  Vision and hearing grossly intact.  NECK: Supple.  No apparent JVD.  RESP:  No IWOB.  Fair aeration bilaterally. CVS:  RRR. Heart sounds normal.  ABD/GI/GU: BS+. Abd soft, NTND.  MSK/EXT:  Moves extremities. No apparent deformity. No edema.  SKIN: no  apparent skin lesion or wound NEURO: Awake, alert and oriented appropriately.  No apparent focal neuro deficit. PSYCH: Calm. Normal affect.   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  Assessment & Plan: Symptomatic iron deficiency anemia-likely due to alcohol abuse and upper GI bleed.  Transfused 2 units with appropriate response.  EGD as above. Recent Labs    07/18/20 2028 07/19/20 0855  HGB 6.6* 8.4*  -Appreciate GI recommendations -Resume previous diet. -No NSAIDs or EtOH -Protonix 40 mg PO BID until follow-up visit. -GI follow-up in 6 to 8 weeks -Follow gastric polyp pathology -Monitor H&H -Hold off iron infusion with elevated LFT.  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 -Counseled on alcohol cessation -Continue CIWA with as needed Ativan -Continue multivitamins, folic acid and thiamine.  Hyponatremia-likely beer potomania.  Improved. -Continue monitoring  Hypokalemia: Likely due to alcohol.  Resolved.  Essential hypertension: Normotensive. -Continue holding losartan  Anxiety: Stable -Continue home Celexa -As needed Ativan as above  GERD: -PPI as above  Body mass index is 31.96 kg/m.         DVT prophylaxis:  SCDs Start: 07/19/20  0420  Code Status: Full code Family Communication: Patient and/or RN. Available if any question.  Level of care: Telemetry Medical Status is: Inpatient  Remains inpatient appropriate because:Persistent severe electrolyte disturbances and Inpatient level of care appropriate due to severity of illness  Dispo: The patient is from: Home              Anticipated d/c is to: Home likely  on 7/2              Patient currently is not medically stable to d/c.   Difficult to place patient No       Consultants:  Gastroenterology   Sch Meds:  Scheduled Meds:  [MAR Hold] citalopram  20 mg Oral Daily   [MAR Hold] folic acid  1 mg Oral Daily   [MAR Hold] multivitamin with minerals  1 tablet Oral Daily   [MAR Hold] pantoprazole (PROTONIX) IV  40 mg Intravenous Q12H   [MAR Hold] thiamine  100 mg Oral Daily   Or   [MAR Hold] thiamine  100 mg Intravenous Daily   Continuous Infusions: PRN Meds:.[MAR Hold] LORazepam **OR** [MAR Hold] LORazepam, [MAR Hold] ondansetron **OR** [MAR Hold] ondansetron (ZOFRAN) IV  Antimicrobials: Anti-infectives (From admission, onward)    None        I have personally reviewed the following labs and images: CBC: Recent Labs  Lab 07/18/20 2028 07/19/20 0855  WBC 6.1 6.1  HGB 6.6* 8.4*  HCT 22.5* 26.7*  MCV 71.2* 73.6*  PLT 154 147*   BMP &GFR Recent Labs  Lab 07/18/20 2028 07/19/20 0328 07/19/20 0855  NA 119* 127* 132*  K 3.0* 3.8 3.9  CL 90* 97* 101  CO2 18* 21* 22  GLUCOSE 126* 120* 122*  BUN 7* 7* 7*  CREATININE 0.87 0.76 0.80  CALCIUM 7.8* 8.3* 8.5*   Estimated Creatinine Clearance: 99.2 mL/min (by C-G formula based on SCr of 0.8 mg/dL). Liver & Pancreas: Recent Labs  Lab 07/18/20 2028 07/19/20 0328  AST 572* 582*  ALT 350* 356*  ALKPHOS 166* 164*  BILITOT 2.0* 3.2*  PROT 6.1* 6.1*  ALBUMIN 2.8* 2.8*   No results for input(s): LIPASE, AMYLASE in the last 168 hours. No results for input(s): AMMONIA in the last 168 hours. Diabetic: No results for input(s): HGBA1C in the last 72 hours. No results for input(s): GLUCAP in the last 168 hours. Cardiac Enzymes: No results for input(s): CKTOTAL, CKMB, CKMBINDEX, TROPONINI in the last 168 hours. No results for input(s): PROBNP in the last 8760 hours. Coagulation Profile: Recent Labs  Lab 07/19/20 0354  INR 1.4*   Thyroid Function Tests: Recent Labs     07/19/20 0500  TSH 4.825*   Lipid Profile: No results for input(s): CHOL, HDL, LDLCALC, TRIG, CHOLHDL, LDLDIRECT in the last 72 hours. Anemia Panel: Recent Labs    07/18/20 2332  VITAMINB12 1,478*  FOLATE 21.3  FERRITIN 22*  TIBC 407  IRON 13*  RETICCTPCT 2.1   Urine analysis:    Component Value Date/Time   COLORURINE YELLOW 01/22/2009 2033   APPEARANCEUR CLEAR 01/22/2009 2033   LABSPEC 1.003 (L) 01/22/2009 2033   PHURINE 5.5 01/22/2009 2033   GLUCOSEU NEGATIVE 01/22/2009 2033   HGBUR NEGATIVE 01/22/2009 2033   [place] NEGATIVE 01/22/2009 2033   [place] NEGATIVE 01/22/2009 2033   PROTEINUR NEGATIVE 01/22/2009 2033   UROBILINOGEN 0.2 01/22/2009 2033   NITRITE NEGATIVE 01/22/2009 2033   LEUKOCYTESUR  01/22/2009 2033    NEGATIVE MICROSCOPIC NOT DONE ON URINES WITH NEGATIVE  PROTEIN, BLOOD, LEUKOCYTES, NITRITE, OR GLUCOSE <1000 mg/dL.   Sepsis Labs: Invalid input(s): PROCALCITONIN, [place]  Microbiology: Recent Results (from the past 240 hour(s))  SARS CORONAVIRUS 2 (TAT 6-24 HRS) Nasopharyngeal Nasopharyngeal Swab     Status: None   Collection Time: 07/18/20 11:38 PM   Specimen: Nasopharyngeal Swab  Result Value Ref Range Status   SARS Coronavirus 2 NEGATIVE NEGATIVE Final    Comment: (NOTE) SARS-CoV-2 target nucleic acids are NOT DETECTED.  The SARS-CoV-2 RNA is generally detectable in upper and lower respiratory specimens during the acute phase of infection. Negative results do not preclude SARS-CoV-2 infection, do not rule out co-infections with other pathogens, and should not be used as the sole basis for treatment or other patient management decisions. Negative results must be combined with clinical observations, patient history, and epidemiological information. The expected result is Negative.  Fact Sheet for Patients: SugarRoll.be  Fact Sheet for Healthcare  Providers: https://www.woods-mathews.com/  This test is not yet approved or cleared by the [place] FDA and  has been authorized for detection and/or diagnosis of SARS-CoV-2 by FDA under an Emergency Use Authorization (EUA). This EUA will remain  in effect (meaning this test can be used) for the duration of the COVID-19 declaration under Se ction 564(b)(1) of the Act, 21 U.S.C. section 360bbb-3(b)(1), unless the authorization is terminated or revoked sooner.  Performed at [facility], [place] [address], [place], [place] [address]     Radiology Studies: US Abdomen Limited RUQ (LIVER/GB)  Result Date: 07/19/2020 CLINICAL DATA:  Cirrhosis EXAM: ULTRASOUND ABDOMEN LIMITED RIGHT UPPER QUADRANT COMPARISON:  None. FINDINGS: Gallbladder: No gallstones or wall thickening visualized. No sonographic Murphy sign noted by sonographer. Common bile duct: Diameter: 2 mm in proximal diameter Liver: The liver contour is nodular in keeping with changes of cirrhosis. No focal intrahepatic masses identified. No intrahepatic biliary ductal dilation. Liver size is within normal limits. Portal vein is patent on color Doppler imaging with normal direction of blood flow towards the liver. Other: No ascites IMPRESSION: Cirrhotic change of the liver. No focal intrahepatic mass identified. Electronically Signed   By: Fidela Salisbury MD   On: 07/19/2020 04:46       Arista Kettlewell T. [place]  If 7PM-7AM, please contact night-coverage [email or web address] 07/19/2020, 12:56 PM

## 2020-07-19 NOTE — Transfer of Care (Signed)
Immediate Anesthesia Transfer of Care Note  Patient: Kristopher Ramos  Procedure(s) Performed: ESOPHAGOGASTRODUODENOSCOPY (EGD) WITH PROPOFOL POLYPECTOMY BIOPSY RADIO FREQUENCY ABLATION  Patient Location: Endoscopy Unit  Anesthesia Type:MAC  Level of Consciousness: awake and drowsy  Airway & Oxygen Therapy: Patient Spontanous Breathing and Patient connected to nasal cannula oxygen  Post-op Assessment: Report given to RN, Post -op Vital signs reviewed and stable and Patient moving all extremities X 4  Post vital signs: Reviewed and stable  Last Vitals:  Vitals Value Taken Time  BP 105/44 07/19/20 1228  Temp 36.7 C 07/19/20 1228  Pulse 63 07/19/20 1228  Resp 16 07/19/20 1228  SpO2 99 % 07/19/20 1228    Last Pain:  Vitals:   07/19/20 1228  TempSrc: Oral  PainSc: 0-No pain         Complications: No notable events documented.

## 2020-07-19 NOTE — H&P (Signed)
History and Physical    Violet Cart TJQ:[id number] DOB: 1955-11-15 DOA: 07/18/2020  PCP: Kristopher Glee., MD  Patient coming from: Home   Chief Complaint:  Chief Complaint  Patient presents with   Abnormal Lab     HPI:    65 year old male with past medical history of alcohol abuse, hypertension, generalized anxiety disorder, gastroesophageal reflux disease, hyperlipidemia who presents to [facility] emergency department with complaints of generalized weakness and dyspnea on exertion.  Of note, patient was recently diagnosed with herpes zoster of the right neck 06/03/2020.  Patient was treated with a course of antivirals in addition to being instructed to take as needed ibuprofen for pain control.  Patient reports that he took at least 4 tablets of ibuprofen daily for a minimum of 4 weeks from May until at least mid June.  Patient explains that approximately 4 weeks prior to his presentation today he noticed that he began to develop generalized weakness.  The generalized weakness was initially mild in intensity but progressively became quite severe.  Generalized weakness was associated with bouts of intense lightheadedness upon standing from a seated position.  Patient also noted that he became more more short of breath as the weeks progressed.  Breath was moderate to severe in intensity, worse with exertion and improved with rest.  Patient denies any associated chest pain.  Upon further questioning patient denies melena, hematemesis, heavy bruising, hematuria or any other source of bleeding.  Patient denies abdominal pain.  Patient reports that he had a colonoscopy approximately 5 years ago and was told that it was unremarkable.  Patient does not recall which provider performed this morning.  Due to patient's progressively worsening weakness lightheadedness and shortness of breath patient initially presented to [facility] emergency department for evaluation.  Upon  evaluation in the emergency department patient was found to have a hemoglobin of 6.6, down from 13 approximately 6 months prior.  Patient was also noted to have a substantial hyponatremia of 119.  Patient was also noted to have markedly elevated transaminases with an AST of 522 and ALT of 350 with alkaline phosphatase of 166 and total bilirubin of 2.0.  A 2 unit packed red blood cell transfusion was initiated.  Patient was also administered 40 mill equivalents of oral potassium.  Hemoccult was performed in the emergency department and found to be negative however.  The hospitalist group was then called to assess the patient for admission to the hospital.  Review of Systems:   Review of Systems  Constitutional:  Positive for malaise/fatigue.  Respiratory:  Positive for shortness of breath.   Gastrointestinal:  Positive for nausea.  Neurological:  Positive for dizziness and weakness.  All other systems reviewed and are negative.  Past Medical History:  Diagnosis Date   Alcohol abuse 07/19/2020   Benign essential hypertension 01/08/2015   Generalized anxiety disorder 07/19/2020   GERD (gastroesophageal reflux disease) 07/19/2020   Mixed hyperlipidemia 09/21/2017    History reviewed. No pertinent surgical history.   reports that he has never smoked. His smokeless tobacco use includes snuff. No history on file for alcohol use and drug use.  No Known Allergies  Family History  Problem Relation Age of Onset   Stomach cancer Neg Hx      Prior to Admission medications   Medication Sig Start Date End Date Taking? Authorizing Provider  atorvastatin (LIPITOR) 20 MG tablet Take 20 mg by mouth daily. 04/24/20  Yes [provider]  citalopram (CELEXA) 20  MG tablet Take 20 mg by mouth daily.   Yes [provider]  EPINEPHrine 0.3 mg/0.3 mL IJ SOAJ injection Inject 0.3 mg into the muscle as needed for anaphylaxis. 11/15/17  Yes [provider]  ibuprofen (ADVIL) 200 MG tablet  Take 400 mg by mouth every 6 (six) hours as needed for headache or moderate pain.   Yes [provider]  pantoprazole (PROTONIX) 40 MG tablet Take 40 mg by mouth daily. 06/21/20  Yes [provider]  amLODipine (NORVASC) 10 MG tablet Take 10 mg by mouth daily. Patient not taking: Reported on 07/19/2020 05/01/20   [provider]  gabapentin (NEURONTIN) 100 MG capsule Take 100 mg by mouth 3 (three) times daily. Patient not taking: Reported on 07/19/2020 07/01/20   [provider]  losartan (COZAAR) 100 MG tablet Take 100 mg by mouth daily. Patient not taking: Reported on 07/19/2020 06/21/20   [provider]  valACYclovir (VALTREX) 1000 MG tablet Take 1,000 mg by mouth See admin instructions. Tid x 7 days Patient not taking: Reported on 07/19/2020 06/05/20   [provider]    Physical Exam: Vitals:   07/19/20 0024 07/19/20 0312 07/19/20 0333 07/19/20 0350  BP: 130/70 125/80 133/77 132/73  Pulse: 77 81 78 81  Resp: 16 19 (!) 21 16  Temp: 98 F (36.7 C) 98 F (36.7 C) 98.1 F (36.7 C) 98.3 F (36.8 C)  TempSrc: Oral Oral Oral Oral  SpO2: 100% 98% 98% 99%  Weight:      Height:        Constitutional: Awake alert and oriented x3, no associated distress.   Skin: no rashes, no lesions, good skin turgor noted. Eyes: Pupils are equally reactive to light.  No evidence of scleral icterus or conjunctival pallor.  ENMT: Moist mucous membranes noted.  Posterior pharynx clear of any exudate or lesions.   Neck: normal, supple, no masses, no thyromegaly.  No evidence of jugular venous distension.   Respiratory: clear to auscultation bilaterally, no wheezing, no crackles. Normal respiratory effort. No accessory muscle use.  Cardiovascular: Regular rate and rhythm, no murmurs / rubs / gallops. No extremity edema. 2+ pedal pulses. No carotid bruits.  Chest:   Nontender without crepitus or deformity.   Back:   Nontender without crepitus or deformity. Abdomen:  Abdomen is soft and nontender.  No evidence of intra-abdominal masses.  Positive bowel sounds noted in all quadrants.   Musculoskeletal: No joint deformity upper and lower extremities. Good ROM, no contractures. Normal muscle tone.  Neurologic: CN 2-12 grossly intact. Sensation intact.  Patient moving all 4 extremities spontaneously.  Patient is following all commands.  Patient is responsive to verbal stimuli.   Psychiatric: Patient exhibits normal mood with appropriate affect.  Patient seems to possess insight as to their current situation.     Labs on Admission: I have personally reviewed following labs and imaging studies -   CBC: Recent Labs  Lab 07/18/20 2028  WBC 6.1  HGB 6.6*  HCT 22.5*  MCV 71.2*  PLT 503   Basic Metabolic Panel: Recent Labs  Lab 07/18/20 2028  NA 119*  K 3.0*  CL 90*  CO2 18*  GLUCOSE 126*  BUN 7*  CREATININE 0.87  CALCIUM 7.8*   GFR: Estimated Creatinine Clearance: 91.2 mL/min (by C-G formula based on SCr of 0.87 mg/dL). Liver Function Tests: Recent Labs  Lab 07/18/20 2028  AST 572*  ALT 350*  ALKPHOS 166*  BILITOT 2.0*  PROT 6.1*  ALBUMIN 2.8*   No results for input(s): LIPASE, AMYLASE in the last 168 hours. No results for input(s): AMMONIA in the last 168 hours. Coagulation Profile: No results for input(s): INR, PROTIME in the last 168 hours. Cardiac Enzymes: No results for input(s): CKTOTAL, CKMB, CKMBINDEX, TROPONINI in the last 168 hours. BNP (last 3 results) No results for input(s): PROBNP in the last 8760 hours. HbA1C: No results for input(s): HGBA1C in the last 72 hours. CBG: No results for input(s): GLUCAP in the last 168 hours. Lipid Profile: No results for input(s): CHOL, HDL, LDLCALC, TRIG, CHOLHDL, LDLDIRECT in the last 72 hours. Thyroid Function Tests: No results for input(s): TSH, T4TOTAL, FREET4, T3FREE, THYROIDAB in the last 72 hours. Anemia Panel: Recent Labs    07/18/20 2332  VITAMINB12 1,478*  FOLATE 21.3   FERRITIN 22*  TIBC 407  IRON 13*  RETICCTPCT 2.1   Urine analysis:    Component Value Date/Time   COLORURINE YELLOW 01/22/2009 2033   APPEARANCEUR CLEAR 01/22/2009 2033   LABSPEC 1.003 (L) 01/22/2009 2033   PHURINE 5.5 01/22/2009 2033   GLUCOSEU NEGATIVE 01/22/2009 2033   HGBUR NEGATIVE 01/22/2009 2033   BILIRUBINUR NEGATIVE 01/22/2009 2033   [place] NEGATIVE 01/22/2009 2033   PROTEINUR NEGATIVE 01/22/2009 2033   UROBILINOGEN 0.2 01/22/2009 2033   NITRITE NEGATIVE 01/22/2009 2033   LEUKOCYTESUR  01/22/2009 2033    NEGATIVE MICROSCOPIC NOT DONE ON URINES WITH NEGATIVE PROTEIN, BLOOD, LEUKOCYTES, NITRITE, OR GLUCOSE <1000 mg/dL.    Radiological Exams on Admission - Personally Reviewed: No results found.  EKG: Personally reviewed.  Rhythm is normal sinus rhythm with heart rate of 80 bpm.  No dynamic ST segment changes appreciated.  Assessment/Plan Principal Problem:   Symptomatic anemia secondary to iron deficiency anemia likely secondary to chronic blood loss.  Patient presenting with several weeks of what seems to be symptomatic anemia with increasing generalized weakness dyspnea on exertion and bouts of lightheadedness. Patient found to have a substantial anemia with hemoglobin 6.6 that is microcytic and is therefore presumably secondary to blood loss. Patient is a longstanding history of alcohol abuse which could very well predispose the patient to either alcoholic gastritis or portal hypertensive gastropathy.  This when combined with at least 4 weeks of ongoing NSAID use likely is predispose the patient to development of an upper gastrointestinal bleed. Patient is undergoing 2 units of packed red blood cell infusion initiated by the emergency department provider. Will continue to monitor hemoglobin and hematocrit with CBCs every 6 hours. Additionally treating patient with intravenous Protonix every 12 hours. Monitoring for hemodynamic instability Epic secure chat message  sent to on-call gastroenterology provider Dr. Lyndel Safe to request nonurgent consultation later this morning.  Active Problems:   Hyponatremia  Patient is suffering from a substantial hyponatremia with sodium of 119 on arrival Patient reports longstanding history of alcohol use drinking at least 6 beers daily with relatively poor oral intake Concern for possible tea and toast diet/beer potomania as the cause of patient's hyponatremia Will obtain BMP every 4 hours to monitor sodium levels closely and ensure slow measured correction of sodium levels to minimize risk of central pontine myelinolysis Working up hyponatremia with urine osmolality, serum osmolality, urine sodium, TSH After this is obtained we will consider gentle intravenous hydration if indicated.    Alcohol abuse Counseled patient on cessation No evidence of active alcohol withdrawal however patient is extremely high risk of development of alcohol withdrawal in the next several hours Will initiate CIWA protocol with as  needed benzodiazepines for evidence of withdrawal    Alcoholic hepatitis without ascites  Markedly elevated transaminases with a pattern consistent with alcoholic hepatitis Hepatitis panel unremarkable Obtaining coagulation panel Right upper quadrant ultrasound Once coagulation panel is obtained we will determine if whether or not patient requires steroids    Benign essential hypertension  Patient appears to be normotensive without antihypertensive therapy.    Mixed hyperlipidemia  Temporarily holding home regimen of statin therapy.    Generalized anxiety disorder  Continue home regimen of Celexa    GERD (gastroesophageal reflux disease)  IV proton pump inhibitor as noted above.    Hypokalemia  Oral potassium chloride has been provided. Continue to monitor potassium levels closely with serial chemistries.  Obtaining magnesium level.   Code Status:  Full code Family Communication: Spouse is at  bedside and has been updated on plan of care.  Status is: Inpatient  Remains inpatient appropriate because:Ongoing diagnostic testing needed not appropriate for outpatient work up, IV treatments appropriate due to intensity of illness or inability to take PO, and Inpatient level of care appropriate due to severity of illness  Dispo: The patient is from: Home              Anticipated d/c is to: Home              Patient currently is not medically stable to d/c.   Difficult to place patient No        Vernelle Emerald MD [facility] Pager [phone]  If 7PM-7AM, please contact night-coverage [email or web address] Use universal [place] password for that web site. If you do not have the password, please call the hospital operator.  07/19/2020, 4:26 AM

## 2020-07-19 NOTE — Consult Note (Addendum)
[facility] Consult: 8:54 AM 07/19/2020  LOS: 0 days    Referring Provider: Dr Cyndia Skeeters  Primary Care Physician:  Kristopher Glee., MD Primary Gastroenterologist:  unassigned.  Dr Kinnie Feil in [facility]     Reason for Consultation: Symptomatic anemia.  FOBT negative cirrhosis on ultrasound   HPI: Kristopher Ramos is a 65 y.o. male.  PMH below Previous colonoscopy with polypectomy (TA w/O HGD) in 12/2014.  Has not had follow-up colonoscopy.  Takes Protonix 40 mg daily which controls any acid reflux symptoms.  No previous EGD DX and treated w anti-viral meds for herpes zoster outbreak 7 weeks ago.  Affected area included right shoulder into the neck.  Completed Valtrex in about a week.  Was also prescribed and took non- NSAID, nonnarcotic pain reliever.  For less than a week he was using 400 mg ibuprofen at night for pain management during interim when he had run out of this nonspecified pain reliever.  For 4 weeks please had decreased energy, anorexia, mild nausea without vomiting.  No change in bowel habits which are daily brown, formed stools with no blood.  More recently dizziness/lightheadedness.  Seen yesterday at PCP office.  Labs obtained.  He received a call early yesterday evening advising him to go to ED because he was anemic.   Hgb 6.6 (13 six months ago/January 2022).  MCV 67, platelets 146 and both of these were normal in January.   FOBT negative. INR 1.4. Na 122.  BUN/creatinine were normal.  Urine sodium less than 10. LFTs with T bili 3.2.  Alk phos 164.  AST/ALT 582/356.  Elevated transaminases (70s/60s), normal T bili and alk phos noted on review of labs over the last few years.. Abdominal ultrasound: Cirrhotic liver without masses.  Portal vein circulation unremarkable.  Received 2 PRBCs.  Patient  drinks 6, 12 ounce, beers daily. Patient works from home.  Employed as a Clinical biochemist.  Married. Family history pertinent for father who had ulcerative colitis and died at about age 66 due to complications of bowel perforation during colonoscopy.    Past Medical History:  Diagnosis Date   Alcohol abuse 07/19/2020   Benign essential hypertension 01/08/2015   Generalized anxiety disorder 07/19/2020   GERD (gastroesophageal reflux disease) 07/19/2020   Mixed hyperlipidemia 09/21/2017    History reviewed. No pertinent surgical history.  Prior to Admission medications   Medication Sig Start Date End Date Taking? Authorizing Provider  atorvastatin (LIPITOR) 20 MG tablet Take 20 mg by mouth daily. 04/24/20  Yes [provider]  citalopram (CELEXA) 20 MG tablet Take 20 mg by mouth daily.   Yes [provider]  EPINEPHrine 0.3 mg/0.3 mL IJ SOAJ injection Inject 0.3 mg into the muscle as needed for anaphylaxis. 11/15/17  Yes [provider]  ibuprofen (ADVIL) 200 MG tablet Take 400 mg by mouth every 6 (six) hours as needed for headache or moderate pain.   Yes [provider]  pantoprazole (PROTONIX) 40 MG tablet Take 40 mg by mouth daily. 06/21/20  Yes [provider]  amLODipine (  NORVASC) 10 MG tablet Take 10 mg by mouth daily. Patient not taking: Reported on 07/19/2020 05/01/20   [provider]  gabapentin (NEURONTIN) 100 MG capsule Take 100 mg by mouth 3 (three) times daily. Patient not taking: Reported on 07/19/2020 07/01/20   [provider]  losartan (COZAAR) 100 MG tablet Take 100 mg by mouth daily. Patient not taking: Reported on 07/19/2020 06/21/20   [provider]  valACYclovir (VALTREX) 1000 MG tablet Take 1,000 mg by mouth See admin instructions. Tid x 7 days Patient not taking: Reported on 07/19/2020 06/05/20   [provider]    Scheduled Meds:  citalopram  20 mg Oral Daily   folic acid  1 mg Oral Daily    multivitamin with minerals  1 tablet Oral Daily   pantoprazole (PROTONIX) IV  40 mg Intravenous Q12H   thiamine  100 mg Oral Daily   Or   thiamine  100 mg Intravenous Daily   Infusions:  PRN Meds: LORazepam **OR** LORazepam, ondansetron **OR** ondansetron (ZOFRAN) IV   Allergies as of 07/18/2020   (Not on File)    Family History  Problem Relation Age of Onset   Stomach cancer Neg Hx     Social History   Socioeconomic History   Marital status: Single    Spouse name: Not on file   Number of children: Not on file   Years of education: Not on file   Highest education level: Not on file  Occupational History   Not on file  Tobacco Use   Smoking status: Never   Smokeless tobacco: Current    Types: Snuff  Substance and Sexual Activity   Alcohol use: Not on file   Drug use: Not on file   Sexual activity: Not on file  Other Topics Concern   Not on file  Social History Narrative   Not on file   Social Determinants of Health   Financial Resource Strain: Not on file  Food Insecurity: Not on file  Transportation Needs: Not on file  Physical Activity: Not on file  Stress: Not on file  Social Connections: Not on file  Intimate Partner Violence: Not on file    REVIEW OF SYSTEMS: Constitutional: Weakness, fatigue. ENT:  No nose bleeds Pulm: No cough.  No profound dyspnea but short of breath with higher exertion activity CV:  No palpitations, no LE edema.  No angina GU:  No hematuria, no frequency GI: See HPI. Heme: No unusual bleeding or bruising. Transfusions: No previous blood transfusions. Neuro:  No headaches, no peripheral tingling or numbness Derm:  No itching, no rash or sores.  Endocrine:  No sweats or chills.  No polyuria or dysuria Immunization: Reviewed but there is no records of previous immunizations and did not ask patient about vaccinations. Travel:  None beyond local counties in last few months.    PHYSICAL EXAM: Vital signs in last 24  hours: Vitals:   07/19/20 0700 07/19/20 0742  BP: (!) 120/56 135/74  Pulse: 73 78  Resp: (!) 22 18  Temp: 98.4 F (36.9 C) 98.2 F (36.8 C)  SpO2: 98% 97%   Wt Readings from Last 3 Encounters:  07/19/20 89.8 kg    General: Pleasant, calm, not acutely ill-appearing Head: No facial asymmetry or swelling.  No signs of head trauma. Eyes: Conjunctiva pink.  No scleral icterus.  EOMI Ears: Not hard of hearing Nose: No congestion or discharge Mouth: Good dentition.  Tongue midline.  Mucosa moist, pink, clear. Neck: No  JVD, no masses, no thyromegaly Lungs: Clear bilaterally.  No labored breathing or cough Heart: RRR.  No MRG.  S1, S2 present Abdomen: Not distended, not tender.  No HSM, masses, bruits, hernias.  No fluid wave or obvious ascites..   Rectal: Did not repeat exam performed by ED provider which showed small amount of light brown stool, FOBT negative. Musc/Skeltl: No joint redness, swelling or gross deformities. Extremities: No CCE. Neurologic: Alert.  Appropriate.  Oriented x3.  No asterixis or tremors.  Moves all 4 limbs with grossly full strength but formal strength testing not pursued. Skin: No telangiectasia.  Skin is tanned.  Do not see any suspicious lesions on incomplete dermatologic survey. Tattoos: None observed. Nodes: No cervical adenopathy Psych: Slightly anxious but cooperative and mostly calm.  Able to provide excellent history.  Fluid speech.  Intake/Output from previous day: 06/30 0701 - 07/01 0700 In: 1218.5 [I.V.:48.5; Blood:1070; IV Piggyback:100] Out: -  Intake/Output this shift: Total I/O In: 325 [Blood:325] Out: -   LAB RESULTS: Recent Labs    07/18/20 2028  WBC 6.1  HGB 6.6*  HCT 22.5*  PLT 154   BMET Lab Results  Component Value Date   NA 127 (L) 07/19/2020   NA 119 (LL) 07/18/2020   NA 139 01/22/2009   K 3.8 07/19/2020   K 3.0 (L) 07/18/2020   K 5.3 MODERATE HEMOLYSIS (H) 01/22/2009   CL 97 (L) 07/19/2020   CL 90 (L) 07/18/2020    CL 105 01/22/2009   CO2 21 (L) 07/19/2020   CO2 18 (L) 07/18/2020   CO2 21 01/22/2009   GLUCOSE 120 (H) 07/19/2020   GLUCOSE 126 (H) 07/18/2020   GLUCOSE 80 01/22/2009   BUN 7 (L) 07/19/2020   BUN 7 (L) 07/18/2020   BUN 13 01/22/2009   CREATININE 0.76 07/19/2020   CREATININE 0.87 07/18/2020   CREATININE 1.0 01/22/2009   CALCIUM 8.3 (L) 07/19/2020   CALCIUM 7.8 (L) 07/18/2020   CALCIUM 9.0 01/22/2009   LFT Recent Labs    07/18/20 2028 07/19/20 0328  PROT 6.1* 6.1*  ALBUMIN 2.8* 2.8*  AST 572* 582*  ALT 350* 356*  ALKPHOS 166* 164*  BILITOT 2.0* 3.2*   PT/INR Lab Results  Component Value Date   INR 1.4 (H) 07/19/2020   Hepatitis Panel Recent Labs    07/18/20 2332  HEPBSAG NON REACTIVE  HCVAB NON REACTIVE  HEPAIGM NON REACTIVE  HEPBIGM NON REACTIVE   C-Diff No components found for: CDIFF Lipase  No results found for: LIPASE  Drugs of Abuse  No results found for: LABOPIA, COCAINSCRNUR, LABBENZ, AMPHETMU, THCU, LABBARB   RADIOLOGY STUDIES: US Abdomen Limited RUQ (LIVER/GB)  Result Date: 07/19/2020 CLINICAL DATA:  Cirrhosis EXAM: ULTRASOUND ABDOMEN LIMITED RIGHT UPPER QUADRANT COMPARISON:  None. FINDINGS: Gallbladder: No gallstones or wall thickening visualized. No sonographic Murphy sign noted by sonographer. Common bile duct: Diameter: 2 mm in proximal diameter Liver: The liver contour is nodular in keeping with changes of cirrhosis. No focal intrahepatic masses identified. No intrahepatic biliary ductal dilation. Liver size is within normal limits. Portal vein is patent on color Doppler imaging with normal direction of blood flow towards the liver. Other: No ascites IMPRESSION: Cirrhotic change of the liver. No focal intrahepatic mass identified. Electronically Signed   By: Fidela Salisbury MD   On: 07/19/2020 04:46    IMPRESSION:      Microcytic anemia.  FOBT negative. W pt c/O anorexia, nausea without vomiting need to rule out ulcer disease despite  his daily  PPI.  Also rule out portal hypertensive gastritis, gastric/esophageal varices.      New diagnosis of cirrhosis in the liver in pt who consumes excessive amounts of beer.      Tubular adenomatous colon polyps on colonoscopy in 2016.     EtOH abuse.  Suspect EtOH dependence.      Hyponatremia      Elevated INR.      PLAN:        EGD today.   D/w pt and he is agreeable to proceed.   Should have colonoscopy for polyp surveillance but this can likely wait and be pursued as outpatient by his GI doctor, Dr. Aurelio Brash in [facility].      Hep ABC serologies.     Azucena Freed  07/19/2020, 8:54 AM Phone [phone]       Attending physician's note   I have taken an interval history, reviewed the chart and examined the patient. I agree with the Advanced Practitioner's note, impression and recommendations.   Cross cover for Dr. Silverio Decamp.  Seen prior to EGD  Microcytic anemia with heme-neg stools. Hb 6.6 on Adm s/p 2U to 8.4. (Baseline 13 29mhs ago)H/O NSAID intake New Dx of cirrhosis likely d/t ETOH on [place] R/O other causes. No ascites. Plt 154K. PT INR 1.4. Alb 2.8. AST/ALT 582/356 H/O tubular adenomas on colonoscopy 2016 (Dr RDorrene German  Plan: -EGD today for further eval. -Stop nonsteroidals -Stop EtOH -Agree with Protonix -He would need colonoscopy as outpt and further evaluation.  He understands.     RCarmell Austria MD LVelora HecklerGI [phone]

## 2020-07-19 NOTE — Op Note (Addendum)
[facility] Patient Name: Kristopher Ramos Procedure Date : 07/19/2020 MRN: [id number] Attending MD: Jackquline Denmark , MD Date of Birth: April 27, 1955 CSN: [id number] Age: 65 Admit Type: Emergency Department Procedure:                Upper GI endoscopy Indications:              Unexplained iron deficiency anemia. 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 History of                            nonsteroidal intake. Providers:                Jackquline Denmark, MD, Kristopher Cancer, RN, Kristopher Ramos                            Technician Referring MD:              Medicines:                Monitored Anesthesia Care Complications:            No immediate complications. Estimated Blood Loss:     Estimated blood loss: none. Procedure:                Pre-Anesthesia Assessment:                           - Prior to the procedure, a History and Physical                            was performed, and patient medications and                            allergies were reviewed. The patient's tolerance of                            previous anesthesia was also reviewed. The risks                            and benefits of the procedure and the sedation                            options and risks were discussed with the patient.                            All questions were answered, and informed consent                            was obtained. Prior Anticoagulants: The patient has                            taken no previous anticoagulant or antiplatelet                            agents. ASA  Grade Assessment: III - A patient with                            severe systemic disease. After reviewing the risks                            and benefits, the patient was deemed in                            satisfactory condition to undergo the procedure.                           After obtaining informed consent, the endoscope was                            passed under direct  vision. Throughout the                            procedure, the patient's blood pressure, pulse, and                            oxygen saturations were monitored continuously. The                            GIF-H190 ([id number]) Olympus gastroscope was                            introduced through the mouth, and advanced to the                            second part of duodenum. The upper GI endoscopy was                            accomplished without difficulty. The patient                            tolerated the procedure well. Scope In: Scope Out: Findings:      The examined esophagus was normal with well-defined Z-line at 40 cm. No       esophageal varices.      A few (8-10) semi-sessile polyps were found in the gastric body. 2       larger polyps (1 cm) had superficial ulcers. These were removed with a       hot snare. Resection and retrieval were complete.      Mild portal hypertensive gastropathy. No fundal varices.      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 Focal RFA of gastric antral vascular ectasia in the gastric       antrum was performed using Barrx-90 radiofrequency ablation catheter was       attached to the tip of the endoscope. The radiofrequency ablation       catheter was placed in contact with the surface of the abnormal mucosa       under direct visualization and energy was applied at 12 J/cm2 x 13  treatments. Mild oozing was noted from 2 spots. Coagulation for       hemostasis using argon plasma at 0.4 liters/minute and 20 watts was       successful.      The examined duodenum was normal. Biopsies for histology were taken with       a cold forceps for evaluation of celiac disease. 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                           - Gastric polyps s/p polypectomy (x 2) Recommendation:           - Return  patient to hospital ward for ongoing care.                           - Resume previous diet.                           - No aspirin, ibuprofen, naproxen, or other                            non-steroidal anti-inflammatory drugs.                           - Stop all alcohol.                           - Use Protonix (pantoprazole) 40 mg PO BID until                            follow-up visit.                           - The findings and recommendations were discussed                            with the patient's wife in detail.                           - FU GI clinic in 6 to 8 weeks. He would need                            repeat treatment and eval. he would also need                            follow-up colonoscopy. Procedure Code(s):        --- Professional ---                           09326, 18, Esophagogastroduodenoscopy, flexible,                            transoral; with control of  bleeding, any method                           43251, Esophagogastroduodenoscopy, flexible,                            transoral; with removal of tumor(s), polyp(s), or                            other lesion(s) by snare technique                           43239, 59, Esophagogastroduodenoscopy, flexible,                            transoral; with biopsy, single or multiple Diagnosis Code(s):        --- Professional ---                           K31.7, Polyp of stomach and duodenum                           K31.819, Angiodysplasia of stomach and duodenum                            without bleeding                           D50.9, Iron deficiency anemia, unspecified CPT copyright 2019 American Medical Association. All rights reserved. The codes documented in this report are preliminary and upon coder review may  be revised to meet current compliance requirements. Jackquline Denmark, MD 07/19/2020 12:33:57 PM This report has been signed electronically. Number of Addenda: 0

## 2020-07-19 NOTE — ED Notes (Signed)
Will hold meds for procedure at 1130.  Endo to get pt.

## 2020-07-19 NOTE — Anesthesia Postprocedure Evaluation (Signed)
Anesthesia Post Note  Patient: Kristopher Ramos  Procedure(s) Performed: ESOPHAGOGASTRODUODENOSCOPY (EGD) WITH PROPOFOL POLYPECTOMY BIOPSY RADIO FREQUENCY ABLATION     Patient location during evaluation: Endoscopy Anesthesia Type: MAC Level of consciousness: awake and alert Pain management: pain level controlled Vital Signs Assessment: post-procedure vital signs reviewed and stable Respiratory status: spontaneous breathing, nonlabored ventilation, respiratory function stable and patient connected to nasal cannula oxygen Cardiovascular status: blood pressure returned to baseline and stable Postop Assessment: no apparent nausea or vomiting Anesthetic complications: no   No notable events documented.  Last Vitals:  Vitals:   07/19/20 1250 07/19/20 1325  BP: 122/66 (!) 144/76  Pulse: 69 68  Resp: 16 18  Temp:  36.6 C  SpO2: 100% 100%    Last Pain:  Vitals:   07/19/20 1325  TempSrc: Oral  PainSc:                  Bernardine Langworthy L Kentley Cedillo

## 2020-07-19 NOTE — Anesthesia Preprocedure Evaluation (Addendum)
Anesthesia Evaluation  Patient identified by MRN, date of birth, ID band Patient awake    Reviewed: Allergy & Precautions, NPO status , Patient's Chart, lab work & pertinent test results  Airway Mallampati: I  TM Distance: >3 FB Neck ROM: Full    Dental no notable dental hx. (+) Teeth Intact, Dental Advisory Given   Pulmonary neg pulmonary ROS,    Pulmonary exam normal breath sounds clear to auscultation       Cardiovascular hypertension, Pt. on medications Normal cardiovascular exam Rhythm:Regular Rate:Normal     Neuro/Psych PSYCHIATRIC DISORDERS Anxiety negative neurological ROS     GI/Hepatic GERD  Medicated,(+)     substance abuse  alcohol use,   Endo/Other  negative endocrine ROS  Renal/GU negative Renal ROS  negative genitourinary   Musculoskeletal negative musculoskeletal ROS (+)   Abdominal   Peds  Hematology  (+) Blood dyscrasia (Hgb 8.4), anemia ,   Anesthesia Other Findings EGD for EtOH abuse and anemia  65 year old male with past medical history of alcohol abuse, hypertension, generalized anxiety disorder, gastroesophageal reflux disease, hyperlipidemia who presents to [facility] emergency department with complaints of generalized weakness and dyspnea on exertion. Hgb on arrival 6.6 now 8.4  Reproductive/Obstetrics                           Anesthesia Physical Anesthesia Plan  ASA: 3  Anesthesia Plan: MAC   Post-op Pain Management:    Induction: Intravenous  PONV Risk Score and Plan: 1 and Propofol infusion and Treatment may vary due to age or medical condition  Airway Management Planned: Natural Airway  Additional Equipment:   Intra-op Plan:   Post-operative Plan:   Informed Consent: I have reviewed the patients History and Physical, chart, labs and discussed the procedure including the risks, benefits and alternatives for the proposed anesthesia with  the patient or authorized representative who has indicated his/her understanding and acceptance.     Dental advisory given  Plan Discussed with: CRNA  Anesthesia Plan Comments:         Anesthesia Quick Evaluation

## 2020-07-20 DIAGNOSIS — R748 Abnormal levels of other serum enzymes: Secondary | ICD-10-CM

## 2020-07-20 LAB — COMPREHENSIVE METABOLIC PANEL
ALT: 364 U/L — ABNORMAL HIGH (ref 0–44)
AST: 558 U/L — ABNORMAL HIGH (ref 15–41)
Albumin: 2.8 g/dL — ABNORMAL LOW (ref 3.5–5.0)
Alkaline Phosphatase: 141 U/L — ABNORMAL HIGH (ref 38–126)
Anion gap: 7 (ref 5–15)
BUN: 5 mg/dL — ABNORMAL LOW (ref 8–23)
CO2: 23 mmol/L (ref 22–32)
Calcium: 8.5 mg/dL — ABNORMAL LOW (ref 8.9–10.3)
Chloride: 101 mmol/L (ref 98–111)
Creatinine, Ser: 0.86 mg/dL (ref 0.61–1.24)
GFR, Estimated: >60 mL/min (ref >60)
Glucose, Bld: 115 mg/dL — ABNORMAL HIGH (ref 70–99)
Potassium: 4 mmol/L (ref 3.5–5.1)
Sodium: 131 mmol/L — ABNORMAL LOW (ref 135–145)
Total Bilirubin: 6.1 mg/dL — ABNORMAL HIGH (ref 0.3–1.2)
Total Protein: 6.4 g/dL — ABNORMAL LOW (ref 6.5–8.1)

## 2020-07-20 LAB — TYPE AND SCREEN
ABO/RH(D): O NEG
Antibody Screen: NEGATIVE
Unit division: 0
Unit division: 0

## 2020-07-20 LAB — CBC
HCT: 27.4 % — ABNORMAL LOW (ref 39.0–52.0)
Hemoglobin: 8.7 g/dL — ABNORMAL LOW (ref 13.0–17.0)
MCH: 23 pg — ABNORMAL LOW (ref 26.0–34.0)
MCHC: 31.8 g/dL (ref 30.0–36.0)
MCV: 72.3 fL — ABNORMAL LOW (ref 80.0–100.0)
Platelets: 159 10*3/uL (ref 150–400)
RBC: 3.79 MIL/uL — ABNORMAL LOW (ref 4.22–5.81)
RDW: 19.7 % — ABNORMAL HIGH (ref 11.5–15.5)
WBC: 7.5 10*3/uL (ref 4.0–10.5)
nRBC: 0 % (ref 0.0–0.2)

## 2020-07-20 LAB — PHOSPHORUS: Phosphorus: 4 mg/dL (ref 2.5–4.6)

## 2020-07-20 LAB — HEMOGLOBIN A1C
Hgb A1c MFr Bld: 5.5 % (ref 4.8–5.6)
Mean Plasma Glucose: 111.15 mg/dL

## 2020-07-20 LAB — AMMONIA: Ammonia: 47 umol/L — ABNORMAL HIGH (ref 9–35)

## 2020-07-20 LAB — BPAM RBC
Blood Product Expiration Date: 202207022359
Blood Product Expiration Date: 202207102359
ISSUE DATE / TIME: 202206302356
ISSUE DATE / TIME: 202207010322
Unit Type and Rh: 9500
Unit Type and Rh: 9500

## 2020-07-20 LAB — PROTIME-INR
INR: 1.5 — ABNORMAL HIGH (ref 0.8–1.2)
Prothrombin Time: 18.1 seconds — ABNORMAL HIGH (ref 11.4–15.2)

## 2020-07-20 LAB — MAGNESIUM: Magnesium: 1.8 mg/dL (ref 1.7–2.4)

## 2020-07-20 NOTE — Progress Notes (Signed)
[facility]  Brief note: Patient had EGD yesterday with Dr. Lyndel Safe with findings of GAVE, patient needs to follow in our outpatient clinic in 4 weeks, I will have my nurse arrange, continue PPI twice daily x2 months and then down to once daily.  Thank you for your kind consultation, please let us know if we may be of any further assistance.  We are signing off.  Ellouise Newer, PA-C

## 2020-07-20 NOTE — Progress Notes (Signed)
PROGRESS NOTE  Kristopher Ramos GLO:[id number] DOB: 31-Aug-1955   PCP: Kristopher Glee., MD  Patient is from: Home.  DOA: 07/18/2020 LOS: 1  Chief complaints:  Chief Complaint  Patient presents with   Abnormal Lab     Brief Narrative / Interim history: 65 year old M with PMH of EtOH abuse, anxiety, HTN, HLD, recent diagnosis of herpes zoster on 5/16 for which he was treated, and GERD presenting with generalized weakness for 4 weeks, dyspnea on exertion and orthostasis, and admitted for symptomatic anemia with Hgb of 6.6 from baseline of 13.  Also hyponatremic to 119 with elevated LFT.  He drinks about 6 beers a day.  Has been using about 4 tablets of ibuprofen daily for herpes zoster pain.  No report of melena or hematochezia.  Hemoccult was negative in ED.  2 units of blood ordered.  GI consulted.  RUQ ultrasound concerning for liver cirrhosis.  H&H improved appropriately after 2 units. 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  GI gave recommendation and signed off.  He can go home once his LFT and bilirubin started to trend down.  Subjective: Seen and examined earlier this morning.  No major events overnight of this morning.  Denies chest pain, dyspnea, GI or UTI symptoms.  Patient's daughter at bedside, and asking why he looks yellowish.  Patient is eager to go home but understands the need for monitoring and rechecking his LFT in the morning.  He is determined to quit drinking.  Objective: Vitals:   07/19/20 1625 07/19/20 2018 07/20/20 0350 07/20/20 1350  BP: 130/62 117/60 132/71 137/75  Pulse: 79 80 68 70  Resp: 18 16 17    Temp: 98.3 F (36.8 C) 100 F (37.8 C) 98.9 F (37.2 C) 98.2 F (36.8 C)  TempSrc: Oral Oral Oral Oral  SpO2: 100% 99% 100% 100%  Weight:      Height:        Intake/Output Summary (Last 24 hours) at 07/20/2020 1758 Last data filed at 07/20/2020  0412 Gross per 24 hour  Intake 150 ml  Output --  Net 150 ml   Filed Weights   07/19/20 0012  Weight: 89.8 kg    Examination:  GENERAL: No apparent distress.  Nontoxic. HEENT: MMM.  Sclerae icteric. NECK: Supple.  No apparent JVD.  RESP: On RA.  No IWOB.  Fair aeration bilaterally. CVS:  RRR. Heart sounds normal.  ABD/GI/GU: BS+. Abd soft, NTND.  MSK/EXT:  Moves extremities. No apparent deformity. No edema.  SKIN: Skin jaundice. NEURO: Awake and alert. Oriented appropriately.  No apparent focal neuro deficit. PSYCH: Calm. Normal affect.   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  Assessment & Plan: Symptomatic iron deficiency anemia-likely due to alcohol abuse and upper GI bleed.  Transfused 2 units with appropriate response.  EGD as above. Recent Labs    07/18/20 2028 07/19/20 0855 07/19/20 1524 07/20/20 0254  HGB 6.6* 8.4* 9.2* 8.7*  -Appreciate GI recommendations -Resume regular diet -No NSAIDs or EtOH -Protonix 40 mg PO BID until follow-up visit in 6 to 8 weeks -Stop EtOH -Follow gastric polyp pathology -Monitor H&H -Hold off iron infusion with elevated LFT.  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  Recent Labs  Lab 07/18/20 2028 07/19/20 0328 07/20/20 0254  AST 572* 582* 558*  ALT 350* 356* 364*  ALKPHOS 166* 164* 141*  BILITOT 2.0* 3.2* 6.1*  PROT 6.1* 6.1* 6.4*  ALBUMIN 2.8* 2.8* 2.8*  -Acute hepatitis panel and HIV nonreactive. -Counseled on alcohol cessation-feels embarrassed.  He is determined to quit. -Continue CIWA with as needed Ativan -Continue multivitamins, folic acid and thiamine. -Recheck LFT in the morning.   Fractionate bilirubin.  Hyponatremia-likely beer potomania.  Improved. -Continue monitoring  Hypokalemia: Likely due to alcohol.  Resolved.  Essential hypertension: Normotensive. -Continue holding losartan  Anxiety: Stable -Continue home Celexa -As needed Ativan as above  GERD: -PPI as above  Body mass index is 31.96 kg/m.         DVT prophylaxis:  SCDs Start: 07/19/20 0420  Code Status: Full code Family Communication: Patient and/or RN. Available if any question.  Level of care: Telemetry Medical Status is: Inpatient  Remains inpatient appropriate because:Persistent severe electrolyte disturbances and Inpatient level of care appropriate due to severity of illness  Dispo: The patient is from: Home              Anticipated d/c is to: Home likely on 7/3              Patient currently is not medically stable to d/c.   Difficult to place patient No       Consultants:  Gastroenterology-signed off   Sch Meds:  Scheduled Meds:  citalopram  20 mg Oral Daily   folic acid  1 mg Oral Daily   multivitamin with minerals  1 tablet Oral Daily   pantoprazole (PROTONIX) IV  40 mg Intravenous Q12H   thiamine  100 mg Oral Daily   Or   thiamine  100 mg Intravenous Daily   Continuous Infusions: PRN Meds:.LORazepam **OR** LORazepam, ondansetron **OR** ondansetron (ZOFRAN) IV  Antimicrobials: Anti-infectives (From admission, onward)    None        I have personally reviewed the following labs and images: CBC: Recent Labs  Lab 07/18/20 2028 07/19/20 0855 07/19/20 1524 07/20/20 0254  WBC 6.1 6.1 6.0 7.5  HGB 6.6* 8.4* 9.2* 8.7*  HCT 22.5* 26.7* 29.0* 27.4*  MCV 71.2* 73.6* 73.4* 72.3*  PLT 154 147* 161 159   BMP &GFR Recent Labs  Lab 07/18/20 2028 07/19/20 0328 07/19/20 0855 07/20/20 0254  NA 119* 127* 132* 131*  K 3.0* 3.8 3.9 4.0  CL 90* 97* 101 101  CO2 18* 21* 22 23  GLUCOSE 126* 120* 122* 115*  BUN 7* 7* 7* 5*  CREATININE 0.87 0.76 0.80 0.86   CALCIUM 7.8* 8.3* 8.5* 8.5*  MG  --   --   --  1.8  PHOS  --   --   --  4.0   Estimated Creatinine Clearance: 92.3 mL/min (by C-G formula based on SCr of 0.86 mg/dL). Liver & Pancreas: Recent Labs  Lab 07/18/20 2028 07/19/20 0328 07/20/20 0254  AST 572* 582* 558*  ALT 350* 356* 364*  ALKPHOS 166* 164* 141*  BILITOT 2.0* 3.2* 6.1*  PROT 6.1* 6.1* 6.4*  ALBUMIN 2.8* 2.8* 2.8*   No results for input(s): LIPASE, AMYLASE in the last 168 hours. Recent Labs  Lab 07/20/20 0254  AMMONIA 47*   Diabetic: Recent Labs    07/20/20 0254  HGBA1C 5.5   No results for input(s): GLUCAP in the last 168 hours. Cardiac Enzymes: No results for input(s): CKTOTAL, CKMB, CKMBINDEX, TROPONINI in the last  168 hours. No results for input(s): PROBNP in the last 8760 hours. Coagulation Profile: Recent Labs  Lab 07/19/20 0354 07/20/20 0254  INR 1.4* 1.5*   Thyroid Function Tests: Recent Labs    07/19/20 0500  TSH 4.825*   Lipid Profile: No results for input(s): CHOL, HDL, LDLCALC, TRIG, CHOLHDL, LDLDIRECT in the last 72 hours. Anemia Panel: Recent Labs    07/18/20 2332  VITAMINB12 1,478*  FOLATE 21.3  FERRITIN 22*  TIBC 407  IRON 13*  RETICCTPCT 2.1   Urine analysis:    Component Value Date/Time   COLORURINE YELLOW 01/22/2009 2033   APPEARANCEUR CLEAR 01/22/2009 2033   LABSPEC 1.003 (L) 01/22/2009 2033   PHURINE 5.5 01/22/2009 2033   GLUCOSEU NEGATIVE 01/22/2009 2033   HGBUR NEGATIVE 01/22/2009 2033   BILIRUBINUR NEGATIVE 01/22/2009 2033   [place] NEGATIVE 01/22/2009 2033   PROTEINUR NEGATIVE 01/22/2009 2033   UROBILINOGEN 0.2 01/22/2009 2033   NITRITE NEGATIVE 01/22/2009 2033   LEUKOCYTESUR  01/22/2009 2033    NEGATIVE MICROSCOPIC NOT DONE ON URINES WITH NEGATIVE PROTEIN, BLOOD, LEUKOCYTES, NITRITE, OR GLUCOSE <1000 mg/dL.   Sepsis Labs: Invalid input(s): PROCALCITONIN, [place]  Microbiology: Recent Results (from the past 240 hour(s))  SARS CORONAVIRUS 2  (TAT 6-24 HRS) Nasopharyngeal Nasopharyngeal Swab     Status: None   Collection Time: 07/18/20 11:38 PM   Specimen: Nasopharyngeal Swab  Result Value Ref Range Status   SARS Coronavirus 2 NEGATIVE NEGATIVE Final    Comment: (NOTE) SARS-CoV-2 target nucleic acids are NOT DETECTED.  The SARS-CoV-2 RNA is generally detectable in upper and lower respiratory specimens during the acute phase of infection. Negative results do not preclude SARS-CoV-2 infection, do not rule out co-infections with other pathogens, and should not be used as the sole basis for treatment or other patient management decisions. Negative results must be combined with clinical observations, patient history, and epidemiological information. The expected result is Negative.  Fact Sheet for Patients: SugarRoll.be  Fact Sheet for Healthcare Providers: https://www.woods-mathews.com/  This test is not yet approved or cleared by the [place] FDA and  has been authorized for detection and/or diagnosis of SARS-CoV-2 by FDA under an Emergency Use Authorization (EUA). This EUA will remain  in effect (meaning this test can be used) for the duration of the COVID-19 declaration under Se ction 564(b)(1) of the Act, 21 U.S.C. section 360bbb-3(b)(1), unless the authorization is terminated or revoked sooner.  Performed at [facility], [place] [address], [place], [place] [address]     Radiology Studies: No results found.     Kandiss Ihrig T. [place]  If 7PM-7AM, please contact night-coverage [email or web address] 07/20/2020, 5:58 PM

## 2020-07-20 NOTE — Plan of Care (Signed)

## 2020-07-21 LAB — CBC
HCT: 27.1 % — ABNORMAL LOW (ref 39.0–52.0)
Hemoglobin: 8.7 g/dL — ABNORMAL LOW (ref 13.0–17.0)
MCH: 23.3 pg — ABNORMAL LOW (ref 26.0–34.0)
MCHC: 32.1 g/dL (ref 30.0–36.0)
MCV: 72.5 fL — ABNORMAL LOW (ref 80.0–100.0)
Platelets: 173 10*3/uL (ref 150–400)
RBC: 3.74 MIL/uL — ABNORMAL LOW (ref 4.22–5.81)
RDW: 20.7 % — ABNORMAL HIGH (ref 11.5–15.5)
WBC: 7.5 10*3/uL (ref 4.0–10.5)
nRBC: 0 % (ref 0.0–0.2)

## 2020-07-21 LAB — RENAL FUNCTION PANEL
Albumin: 2.7 g/dL — ABNORMAL LOW (ref 3.5–5.0)
Anion gap: 8 (ref 5–15)
BUN: 7 mg/dL — ABNORMAL LOW (ref 8–23)
CO2: 24 mmol/L (ref 22–32)
Calcium: 8.4 mg/dL — ABNORMAL LOW (ref 8.9–10.3)
Chloride: 96 mmol/L — ABNORMAL LOW (ref 98–111)
Creatinine, Ser: 0.87 mg/dL (ref 0.61–1.24)
GFR, Estimated: >60 mL/min (ref >60)
Glucose, Bld: 111 mg/dL — ABNORMAL HIGH (ref 70–99)
Phosphorus: 3.9 mg/dL (ref 2.5–4.6)
Potassium: 4.1 mmol/L (ref 3.5–5.1)
Sodium: 128 mmol/L — ABNORMAL LOW (ref 135–145)

## 2020-07-21 LAB — HEPATIC FUNCTION PANEL
ALT: 380 U/L — ABNORMAL HIGH (ref 0–44)
AST: 592 U/L — ABNORMAL HIGH (ref 15–41)
Albumin: 2.6 g/dL — ABNORMAL LOW (ref 3.5–5.0)
Alkaline Phosphatase: 134 U/L — ABNORMAL HIGH (ref 38–126)
Bilirubin, Direct: 3 mg/dL — ABNORMAL HIGH (ref 0.0–0.2)
Indirect Bilirubin: 2.1 mg/dL — ABNORMAL HIGH (ref 0.3–0.9)
Total Bilirubin: 5.1 mg/dL — ABNORMAL HIGH (ref 0.3–1.2)
Total Protein: 6.2 g/dL — ABNORMAL LOW (ref 6.5–8.1)

## 2020-07-21 LAB — PHOSPHORUS: Phosphorus: 3.9 mg/dL (ref 2.5–4.6)

## 2020-07-21 LAB — MAGNESIUM: Magnesium: 1.7 mg/dL (ref 1.7–2.4)

## 2020-07-21 LAB — CK: Total CK: 45 U/L — ABNORMAL LOW (ref 49–397)

## 2020-07-21 MED ORDER — BISACODYL 5 MG PO TBEC
5.0000 mg | DELAYED_RELEASE_TABLET | Freq: Every day | ORAL | Status: DC | PRN
  Administered 2020-07-21: 5 mg via ORAL
  Filled 2020-07-21: qty 1

## 2020-07-21 MED ORDER — PANTOPRAZOLE SODIUM 40 MG PO TBEC
40.0000 mg | DELAYED_RELEASE_TABLET | Freq: Two times a day (BID) | ORAL | Status: DC
  Administered 2020-07-21 – 2020-07-30 (×18): 40 mg via ORAL
  Filled 2020-07-21 (×18): qty 1

## 2020-07-21 MED ORDER — POLYETHYLENE GLYCOL 3350 17 G PO PACK
17.0000 g | PACK | Freq: Every day | ORAL | Status: DC
  Administered 2020-07-21 – 2020-07-30 (×9): 17 g via ORAL
  Filled 2020-07-21 (×10): qty 1

## 2020-07-21 NOTE — Progress Notes (Signed)
PROGRESS NOTE  Kristopher Ramos QIO:[id number] DOB: 03/29/1955   PCP: Kristopher Glee., MD  Patient is from: Home.  DOA: 07/18/2020 LOS: 2  Chief complaints:  Chief Complaint  Patient presents with   Abnormal Lab     Brief Narrative / Interim history: 65 year old M with PMH of EtOH abuse, anxiety, HTN, HLD, recent diagnosis of herpes zoster on 5/16 for which he was treated, and GERD presenting with generalized weakness for 4 weeks, dyspnea on exertion and orthostasis, and admitted for symptomatic anemia with Hgb of 6.6 from baseline of 13.  Also hyponatremic to 119 with elevated LFT.  He drinks about 6 beers a day.  Has been using about 4 tablets of ibuprofen daily for herpes zoster pain.  RUQ ultrasound concerning for liver cirrhosis.  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  GI gave recommendation and signed off.  D/c home once his LFT and bilirubin started to trend down.   Subjective: Has been under a lot of stress Plans to quit drinking   Objective: Vitals:   07/20/20 0350 07/20/20 1350 07/20/20 2115 07/21/20 0414  BP: 132/71 137/75 137/72 132/62  Pulse: 68 70 72 70  Resp: 17  18 16   Temp: 98.9 F (37.2 C) 98.2 F (36.8 C) 99.1 F (37.3 C) 98.7 F (37.1 C)  TempSrc: Oral Oral Oral Oral  SpO2: 100% 100% 100% 97%  Weight:      Height:        Intake/Output Summary (Last 24 hours) at 07/21/2020 0949 Last data filed at 07/21/2020 0415 Gross per 24 hour  Intake 120 ml  Output --  Net 120 ml   Filed Weights   07/19/20 0012  Weight: 89.8 kg    Examination:   General: Appearance:    Obese male in no acute distress     Lungs:     respirations unlabored     MS:   All extremities are intact.    Neurologic:   Awake, alert, oriented x 3. 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   -s/p 2 units with appropriate response.  EGD as above. 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 -Acute hepatitis panel and HIV nonreactive. -Counseled on alcohol cessation -Continue CIWA with as needed Ativan -Continue multivitamins, folic acid and thiamine.  Hyponatremia-likely beer potomania.  Improved.  Hypokalemia: Likely due to alcohol.  Resolved.  Essential hypertension: Normotensive. -Continue holding losartan  Anxiety: Stable -Continue home Celexa -As needed Ativan as above  GERD: -PPI as above  obesity Body mass index is 31.96 kg/m.         DVT prophylaxis:  SCDs Start: 07/19/20 0420  Code Status: Full code Family Communication: at bedside Level of care: Telemetry Medical Status is: Inpatient  Remains inpatient appropriate because:Persistent severe electrolyte disturbances and Inpatient level of care appropriate due to severity of illness  Dispo: The patient is from: Home  Anticipated d/c is to: Home -- once LFTs on decline: 24-48 hours              Patient currently is not medically stable to d/c.   Difficult to place patient No       Consultants:  Gastroenterology-signed off   Sch Meds:  Scheduled Meds:  citalopram  20 mg Oral Daily   folic acid  1 mg Oral Daily   multivitamin with minerals  1 tablet Oral Daily   pantoprazole (PROTONIX) IV  40 mg Intravenous Q12H   thiamine  100 mg Oral Daily   Or   thiamine  100 mg Intravenous Daily    Continuous Infusions: PRN Meds:.LORazepam **OR** LORazepam, ondansetron **OR** ondansetron (ZOFRAN) IV  Antimicrobials: Anti-infectives (From admission, onward)    None        I have personally reviewed the following labs and images: CBC: Recent Labs  Lab 07/18/20 2028 07/19/20 0855 07/19/20 1524 07/20/20 0254 07/21/20 0104  WBC 6.1 6.1 6.0 7.5 7.5  HGB 6.6* 8.4* 9.2* 8.7* 8.7*  HCT 22.5* 26.7* 29.0* 27.4* 27.1*  MCV 71.2* 73.6* 73.4* 72.3* 72.5*  PLT 154 147* 161 159 173   BMP &GFR Recent Labs  Lab 07/18/20 2028 07/19/20 0328 07/19/20 0855 07/20/20 0254 07/21/20 0104  NA 119* 127* 132* 131* 128*  K 3.0* 3.8 3.9 4.0 4.1  CL 90* 97* 101 101 96*  CO2 18* 21* 22 23 24   GLUCOSE 126* 120* 122* 115* 111*  BUN 7* 7* 7* 5* 7*  CREATININE 0.87 0.76 0.80 0.86 0.87  CALCIUM 7.8* 8.3* 8.5* 8.5* 8.4*  MG  --   --   --  1.8 1.7  PHOS  --   --   --  4.0 3.9  3.9   Estimated Creatinine Clearance: 91.2 mL/min (by C-G formula based on SCr of 0.87 mg/dL). Liver & Pancreas: Recent Labs  Lab 07/18/20 2028 07/19/20 0328 07/20/20 0254 07/21/20 0104  AST 572* 582* 558* 592*  ALT 350* 356* 364* 380*  ALKPHOS 166* 164* 141* 134*  BILITOT 2.0* 3.2* 6.1* 5.1*  PROT 6.1* 6.1* 6.4* 6.2*  ALBUMIN 2.8* 2.8* 2.8* 2.6*  2.7*   No results for input(s): LIPASE, AMYLASE in the last 168 hours. Recent Labs  Lab 07/20/20 0254  AMMONIA 47*   Diabetic: Recent Labs    07/20/20 0254  HGBA1C 5.5   No results for input(s): GLUCAP in the last 168 hours. Cardiac Enzymes: Recent Labs  Lab 07/21/20 0104  CKTOTAL 45*   No results for input(s): PROBNP in the last 8760 hours. Coagulation Profile: Recent Labs  Lab 07/19/20 0354 07/20/20 0254  INR 1.4* 1.5*   Thyroid Function Tests: Recent Labs    07/19/20 0500  TSH 4.825*   Lipid Profile: No results for input(s): CHOL, HDL, LDLCALC, TRIG, CHOLHDL, LDLDIRECT in the last 72 hours. Anemia Panel: Recent Labs     07/18/20 2332  VITAMINB12 1,478*  FOLATE 21.3  FERRITIN 22*  TIBC 407  IRON 13*  RETICCTPCT 2.1   Urine analysis:    Component Value Date/Time   COLORURINE YELLOW 01/22/2009 2033   APPEARANCEUR CLEAR 01/22/2009 2033   LABSPEC 1.003 (L) 01/22/2009 2033   PHURINE 5.5 01/22/2009 2033   GLUCOSEU NEGATIVE 01/22/2009 2033   HGBUR NEGATIVE 01/22/2009 2033   [place] NEGATIVE 01/22/2009 2033   [place] NEGATIVE 01/22/2009 2033   PROTEINUR NEGATIVE 01/22/2009 2033   UROBILINOGEN 0.2 01/22/2009 2033   NITRITE NEGATIVE 01/22/2009 2033   LEUKOCYTESUR  01/22/2009 2033    NEGATIVE MICROSCOPIC NOT DONE ON URINES WITH NEGATIVE PROTEIN, BLOOD, LEUKOCYTES, NITRITE, OR GLUCOSE <1000 mg/dL.   Sepsis Labs: Invalid input(s): PROCALCITONIN, [place]  Microbiology: Recent Results (from the past 240 hour(s))  SARS CORONAVIRUS 2 (TAT 6-24 HRS) Nasopharyngeal Nasopharyngeal Swab     Status: None   Collection Time: 07/18/20 11:38 PM   Specimen: Nasopharyngeal Swab  Result Value Ref Range Status   SARS Coronavirus 2 NEGATIVE NEGATIVE Final    Comment: (NOTE) SARS-CoV-2 target nucleic acids are NOT DETECTED.  The SARS-CoV-2 RNA is generally detectable in upper and lower respiratory specimens during the acute phase of infection. Negative results do not preclude SARS-CoV-2 infection, do not rule out co-infections with other pathogens, and should not be used as the sole basis for treatment or other patient management decisions. Negative results must be combined with clinical observations, patient history, and epidemiological information. The expected result is Negative.  Fact Sheet for Patients: SugarRoll.be  Fact Sheet for Healthcare Providers: https://www.woods-mathews.com/  This test is not yet approved or cleared by the [place] FDA and  has been authorized for detection and/or diagnosis of SARS-CoV-2 by FDA under an Emergency Use  Authorization (EUA). This EUA will remain  in effect (meaning this test can be used) for the duration of the COVID-19 declaration under Se ction 564(b)(1) of the Act, 21 U.S.C. section 360bbb-3(b)(1), unless the authorization is terminated or revoked sooner.  Performed at [facility], [place] [address], [place], [place] [address]     Radiology Studies: No results found.    Eulogio Bear DO [facility]  If 7PM-7AM, please contact night-coverage [email or web address] 07/21/2020, 9:49 AM

## 2020-07-22 LAB — COMPREHENSIVE METABOLIC PANEL
ALT: 422 U/L — ABNORMAL HIGH (ref 0–44)
AST: 664 U/L — ABNORMAL HIGH (ref 15–41)
Albumin: 2.7 g/dL — ABNORMAL LOW (ref 3.5–5.0)
Alkaline Phosphatase: 170 U/L — ABNORMAL HIGH (ref 38–126)
Anion gap: 8 (ref 5–15)
BUN: 6 mg/dL — ABNORMAL LOW (ref 8–23)
CO2: 23 mmol/L (ref 22–32)
Calcium: 8.4 mg/dL — ABNORMAL LOW (ref 8.9–10.3)
Chloride: 100 mmol/L (ref 98–111)
Creatinine, Ser: 0.86 mg/dL (ref 0.61–1.24)
GFR, Estimated: >60 mL/min (ref >60)
Glucose, Bld: 108 mg/dL — ABNORMAL HIGH (ref 70–99)
Potassium: 3.7 mmol/L (ref 3.5–5.1)
Sodium: 131 mmol/L — ABNORMAL LOW (ref 135–145)
Total Bilirubin: 5.1 mg/dL — ABNORMAL HIGH (ref 0.3–1.2)
Total Protein: 6 g/dL — ABNORMAL LOW (ref 6.5–8.1)

## 2020-07-22 LAB — CBC
HCT: 27.4 % — ABNORMAL LOW (ref 39.0–52.0)
Hemoglobin: 8.8 g/dL — ABNORMAL LOW (ref 13.0–17.0)
MCH: 23.8 pg — ABNORMAL LOW (ref 26.0–34.0)
MCHC: 32.1 g/dL (ref 30.0–36.0)
MCV: 74.3 fL — ABNORMAL LOW (ref 80.0–100.0)
Platelets: 186 10*3/uL (ref 150–400)
RBC: 3.69 MIL/uL — ABNORMAL LOW (ref 4.22–5.81)
RDW: 22.3 % — ABNORMAL HIGH (ref 11.5–15.5)
WBC: 7.1 10*3/uL (ref 4.0–10.5)
nRBC: 0 % (ref 0.0–0.2)

## 2020-07-22 LAB — T4, FREE: Free T4: 1.2 ng/dL — ABNORMAL HIGH (ref 0.61–1.12)

## 2020-07-22 MED ORDER — CAMPHOR-MENTHOL 0.5-0.5 % EX LOTN
TOPICAL_LOTION | CUTANEOUS | Status: DC | PRN
  Filled 2020-07-22: qty 222

## 2020-07-22 MED ORDER — LORAZEPAM 2 MG/ML IJ SOLN: 1.0000 mg | INTRAMUSCULAR | Status: DC | PRN

## 2020-07-22 MED ORDER — LORAZEPAM 1 MG PO TABS
1.0000 mg | ORAL_TABLET | ORAL | Status: DC | PRN
Start: 2020-07-22 — End: 2020-07-23

## 2020-07-22 NOTE — Progress Notes (Signed)
PROGRESS NOTE  Kristopher Ramos YBO:[id number] DOB: Oct 13, 1955   PCP: Kristopher Glee., MD  Patient is from: Home.  DOA: 07/18/2020 LOS: 3  Chief complaints:  Chief Complaint  Patient presents with   Abnormal Lab     Brief Narrative / Interim history: 65 year old M with PMH of EtOH abuse, anxiety, HTN, HLD, recent diagnosis of herpes zoster on 5/16 for which he was treated, and GERD presenting with generalized weakness for 4 weeks, dyspnea on exertion and orthostasis, and admitted for symptomatic anemia with Hgb of 6.6 from baseline of 13.  Also hyponatremic to 119 with elevated LFT.  He drinks about 6 beers a day.  Has been using about 4 tablets of ibuprofen daily for herpes zoster pain.  RUQ ultrasound concerning for liver cirrhosis.  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  ? Viral hepatitis. D/c home once his LFT and bilirubin started to trend down.   Subjective: Did not feel well last night   Objective: Vitals:   07/20/20 2115 07/21/20 0414 07/21/20 1301 07/22/20 0404  BP: 137/72 132/62 136/80 (!) 113/52  Pulse: 72 70 72 71  Resp: 18 16 16 17   Temp: 99.1 F (37.3 C) 98.7 F (37.1 C) 98.2 F (36.8 C) 98.5 F (36.9 C)  TempSrc: Oral Oral Oral Oral  SpO2: 100% 97% 100% 97%  Weight:      Height:        Intake/Output Summary (Last 24 hours) at 07/22/2020 1101 Last data filed at 07/22/2020 0830 Gross per 24 hour  Intake 580 ml  Output --  Net 580 ml   Filed Weights   07/19/20 0012  Weight: 89.8 kg    Examination:   General: Appearance:    Obese male in no acute distress     Lungs:     respirations unlabored     MS:   All extremities are intact.    Neurologic:   Awake, alert, oriented x 3. 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   -s/p 2 units with appropriate response.  EGD as above. 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 -Acute hepatitis panel and HIV nonreactive. -Counseled on alcohol cessation -Continue CIWA with as needed Ativan -Continue multivitamins, folic acid and thiamine. -r/o viral hepatitis- PCR for HSV pending  Hyponatremia-likely beer potomania.  Improved.  Hypokalemia: Likely due to alcohol.  Resolved.  Essential hypertension: Normotensive. -Continue holding losartan  Abnormal TSH/ft4 -outpatient follow up  Anxiety: Stable -Continue home Celexa -As needed Ativan as above  GERD: -PPI as above  obesity Body mass index is 31.96 kg/m.         DVT prophylaxis:  SCDs Start: 07/19/20 0420  Code Status: Full code Family Communication: at bedside Level of care: Telemetry Medical Status is: Inpatient  Remains inpatient appropriate because:Persistent severe electrolyte disturbances and Inpatient level of care appropriate due to severity of illness  Dispo: The patient is from: Home  Anticipated d/c is to: Home -- once LFTs on decline: 24-48 hours              Patient currently is not medically stable to d/c.   Difficult to place patient No       Consultants:  Gastroenterology-signed off   Sch Meds:  Scheduled Meds:  citalopram  20 mg Oral Daily   folic acid  1 mg Oral Daily   multivitamin with minerals  1 tablet Oral Daily   pantoprazole  40 mg Oral BID   polyethylene glycol  17 g Oral Daily   thiamine  100 mg Oral Daily    Or   thiamine  100 mg Intravenous Daily   Continuous Infusions: PRN Meds:.bisacodyl, LORazepam **OR** LORazepam, ondansetron **OR** ondansetron (ZOFRAN) IV  Antimicrobials: Anti-infectives (From admission, onward)    None        I have personally reviewed the following labs and images: CBC: Recent Labs  Lab 07/19/20 0855 07/19/20 1524 07/20/20 0254 07/21/20 0104 07/22/20 0051  WBC 6.1 6.0 7.5 7.5 7.1  HGB 8.4* 9.2* 8.7* 8.7* 8.8*  HCT 26.7* 29.0* 27.4* 27.1* 27.4*  MCV 73.6* 73.4* 72.3* 72.5* 74.3*  PLT 147* 161 159 173 186   BMP &GFR Recent Labs  Lab 07/19/20 0328 07/19/20 0855 07/20/20 0254 07/21/20 0104 07/22/20 0051  NA 127* 132* 131* 128* 131*  K 3.8 3.9 4.0 4.1 3.7  CL 97* 101 101 96* 100  CO2 21* 22 23 24 23   GLUCOSE 120* 122* 115* 111* 108*  BUN 7* 7* 5* 7* 6*  CREATININE 0.76 0.80 0.86 0.87 0.86  CALCIUM 8.3* 8.5* 8.5* 8.4* 8.4*  MG  --   --  1.8 1.7  --   PHOS  --   --  4.0 3.9  3.9  --    Estimated Creatinine Clearance: 92.3 mL/min (by C-G formula based on SCr of 0.86 mg/dL). Liver & Pancreas: Recent Labs  Lab 07/18/20 2028 07/19/20 0328 07/20/20 0254 07/21/20 0104 07/22/20 0051  AST 572* 582* 558* 592* 664*  ALT 350* 356* 364* 380* 422*  ALKPHOS 166* 164* 141* 134* 170*  BILITOT 2.0* 3.2* 6.1* 5.1* 5.1*  PROT 6.1* 6.1* 6.4* 6.2* 6.0*  ALBUMIN 2.8* 2.8* 2.8* 2.6*  2.7* 2.7*   No results for input(s): LIPASE, AMYLASE in the last 168 hours. Recent Labs  Lab 07/20/20 0254  AMMONIA 47*   Diabetic: Recent Labs    07/20/20 0254  HGBA1C 5.5   No results for input(s): GLUCAP in the last 168 hours. Cardiac Enzymes: Recent Labs  Lab 07/21/20 0104  CKTOTAL 45*   No results for input(s): PROBNP in the last 8760 hours. Coagulation Profile: Recent Labs  Lab 07/19/20 0354 07/20/20 0254  INR 1.4* 1.5*   Thyroid Function Tests: Recent Labs    07/22/20 0051  FREET4 1.20*   Lipid Profile: No results for input(s): CHOL,  HDL, LDLCALC, TRIG, CHOLHDL, LDLDIRECT in the last 72 hours. Anemia Panel: No results for input(s): VITAMINB12, FOLATE, FERRITIN, TIBC, IRON, RETICCTPCT in the last 72 hours.  Urine analysis:    Component Value Date/Time   COLORURINE YELLOW 01/22/2009 2033   APPEARANCEUR CLEAR 01/22/2009 2033   LABSPEC 1.003 (L) 01/22/2009 2033   PHURINE 5.5 01/22/2009 2033   GLUCOSEU NEGATIVE 01/22/2009 2033   HGBUR NEGATIVE 01/22/2009 2033   [place] NEGATIVE 01/22/2009 2033   [place] NEGATIVE 01/22/2009 2033   PROTEINUR NEGATIVE 01/22/2009 2033   UROBILINOGEN 0.2 01/22/2009 2033   NITRITE NEGATIVE 01/22/2009  2033   LEUKOCYTESUR  01/22/2009 2033    NEGATIVE MICROSCOPIC NOT DONE ON URINES WITH NEGATIVE PROTEIN, BLOOD, LEUKOCYTES, NITRITE, OR GLUCOSE <1000 mg/dL.   Sepsis Labs: Invalid input(s): PROCALCITONIN, [place]  Microbiology: Recent Results (from the past 240 hour(s))  SARS CORONAVIRUS 2 (TAT 6-24 HRS) Nasopharyngeal Nasopharyngeal Swab     Status: None   Collection Time: 07/18/20 11:38 PM   Specimen: Nasopharyngeal Swab  Result Value Ref Range Status   SARS Coronavirus 2 NEGATIVE NEGATIVE Final    Comment: (NOTE) SARS-CoV-2 target nucleic acids are NOT DETECTED.  The SARS-CoV-2 RNA is generally detectable in upper and lower respiratory specimens during the acute phase of infection. Negative results do not preclude SARS-CoV-2 infection, do not rule out co-infections with other pathogens, and should not be used as the sole basis for treatment or other patient management decisions. Negative results must be combined with clinical observations, patient history, and epidemiological information. The expected result is Negative.  Fact Sheet for Patients: SugarRoll.be  Fact Sheet for Healthcare Providers: https://www.woods-mathews.com/  This test is not yet approved or cleared by the [place] FDA and  has been authorized for  detection and/or diagnosis of SARS-CoV-2 by FDA under an Emergency Use Authorization (EUA). This EUA will remain  in effect (meaning this test can be used) for the duration of the COVID-19 declaration under Se ction 564(b)(1) of the Act, 21 U.S.C. section 360bbb-3(b)(1), unless the authorization is terminated or revoked sooner.  Performed at [facility], [place] [address] [place], [place] [address]     Radiology Studies: No results found.    Eulogio Bear DO [facility]  If 7PM-7AM, please contact night-coverage [email or web address] 07/22/2020, 11:01 AM

## 2020-07-23 ENCOUNTER — Telehealth: Payer: Self-pay

## 2020-07-23 LAB — COMPREHENSIVE METABOLIC PANEL
ALT: 454 U/L — ABNORMAL HIGH (ref 0–44)
AST: 656 U/L — ABNORMAL HIGH (ref 15–41)
Albumin: 2.6 g/dL — ABNORMAL LOW (ref 3.5–5.0)
Alkaline Phosphatase: 134 U/L — ABNORMAL HIGH (ref 38–126)
Anion gap: 6 (ref 5–15)
BUN: 8 mg/dL (ref 8–23)
CO2: 25 mmol/L (ref 22–32)
Calcium: 8.3 mg/dL — ABNORMAL LOW (ref 8.9–10.3)
Chloride: 102 mmol/L (ref 98–111)
Creatinine, Ser: 0.89 mg/dL (ref 0.61–1.24)
GFR, Estimated: >60 mL/min (ref >60)
Glucose, Bld: 116 mg/dL — ABNORMAL HIGH (ref 70–99)
Potassium: 3.8 mmol/L (ref 3.5–5.1)
Sodium: 133 mmol/L — ABNORMAL LOW (ref 135–145)
Total Bilirubin: 5.1 mg/dL — ABNORMAL HIGH (ref 0.3–1.2)
Total Protein: 5.9 g/dL — ABNORMAL LOW (ref 6.5–8.1)

## 2020-07-23 LAB — CBC
HCT: 29.4 % — ABNORMAL LOW (ref 39.0–52.0)
Hemoglobin: 9.2 g/dL — ABNORMAL LOW (ref 13.0–17.0)
MCH: 23.6 pg — ABNORMAL LOW (ref 26.0–34.0)
MCHC: 31.3 g/dL (ref 30.0–36.0)
MCV: 75.4 fL — ABNORMAL LOW (ref 80.0–100.0)
Platelets: 209 10*3/uL (ref 150–400)
RBC: 3.9 MIL/uL — ABNORMAL LOW (ref 4.22–5.81)
RDW: 24.5 % — ABNORMAL HIGH (ref 11.5–15.5)
WBC: 8.3 10*3/uL (ref 4.0–10.5)
nRBC: 0 % (ref 0.0–0.2)

## 2020-07-23 LAB — SURGICAL PATHOLOGY

## 2020-07-23 MED ORDER — MAGNESIUM SULFATE 2 GM/50ML IV SOLN
2.0000 g | Freq: Once | INTRAVENOUS | Status: AC
  Administered 2020-07-23: 2 g via INTRAVENOUS
  Filled 2020-07-23: qty 50

## 2020-07-23 MED ORDER — SODIUM CHLORIDE 0.9 % IV BOLUS: 500.0000 mL | Freq: Once | INTRAVENOUS | Status: DC

## 2020-07-23 MED ORDER — HYDROXYZINE HCL 25 MG PO TABS: 25.0000 mg | ORAL_TABLET | Freq: Three times a day (TID) | ORAL | Status: DC | PRN

## 2020-07-23 NOTE — Telephone Encounter (Signed)
-----   Message from [place], [place] sent at 07/20/2020  9:12 AM EDT ----- Regarding: Needs follow up Patient needs follow-up in our clinic in 4 weeks for GI bleed.  This can be with either Tye Savoy or Dr. Lyndel Safe.  THank you, JLL

## 2020-07-23 NOTE — Progress Notes (Signed)
PROGRESS NOTE  Kristopher Ramos DVV:[id number] DOB: 1955-08-10   PCP: Kristopher Glee., MD  Patient is from: Home.  DOA: 07/18/2020 LOS: 4  Chief complaints:  Chief Complaint  Patient presents with   Abnormal Lab     Brief Narrative / Interim history: 65 year old M with PMH of EtOH abuse, anxiety, HTN, HLD, recent diagnosis of herpes zoster on 5/16 for which he was treated, and GERD presenting with generalized weakness for 4 weeks, dyspnea on exertion and orthostasis, and admitted for symptomatic anemia with Hgb of 6.6 from baseline of 13.  Also hyponatremic to 119 with elevated LFT.  He drinks about 6 beers a day.  Has been using about 4 tablets of ibuprofen daily for herpes zoster pain.  RUQ ultrasound concerning for liver cirrhosis.  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  ? Viral hepatitis. D/c home once his LFT and bilirubin started to trend down.   Subjective: Itching better with sarna   Objective: Vitals:   07/22/20 0404 07/22/20 1558 07/22/20 2125 07/23/20 0505  BP: (!) 113/52 124/71 136/77 (!) 130/57  Pulse: 71 72 77 62  Resp: 17 17 18 18   Temp: 98.5 F (36.9 C) 98.2 F (36.8 C) 98.8 F (37.1 C) 98.5 F (36.9 C)  TempSrc: Oral Oral Oral Oral  SpO2: 97% 99% 100% 100%  Weight:      Height:        Intake/Output Summary (Last 24 hours) at 07/23/2020 1203 Last data filed at 07/23/2020 1027 Gross per 24 hour  Intake 456 ml  Output --  Net 456 ml   Filed Weights   07/19/20 0012  Weight: 89.8 kg    Examination:   General: Appearance:    Obese male in no acute distress     Lungs:      respirations unlabored  Heart:    Normal heart rate.    MS:   All extremities are intact.    Neurologic:   Awake, alert, oriented x 3. No apparent focal neurological           defect.        Procedures:  EGD on 7/1-Gastric antral vascular ectasia treated with RFA  and APC, mild portal hypertensive gastropathy and gastric polyps s/p polypectomy (x 2)    Assessment & Plan:  Symptomatic iron deficiency anemia -likely due to alcohol abuse and upper GI bleed.   -s/p 2 units with appropriate response.  EGD as above. 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 -Acute hepatitis panel and HIV nonreactive. -Counseled on alcohol cessation -Continue CIWA with as needed Ativan -Continue multivitamins, folic acid and thiamine. -r/o viral hepatitis- PCR for HSV pending  Hyponatremia-likely beer potomania.  Improved.  Hypokalemia: Likely due to alcohol.  Resolved.  Essential hypertension: Normotensive. -Continue holding losartan  Abnormal TSH/ft4 -outpatient follow up  Anxiety: Stable -Continue home Celexa -As needed Ativan as above  GERD: -PPI as above  obesity Body mass index is 31.96 kg/m.         DVT prophylaxis:  SCDs Start: 07/19/20 0420  Code Status: Full code Family Communication: at bedside 7/4 Level of care: Telemetry Medical Status is: Inpatient  Remains inpatient appropriate  because:Persistent severe electrolyte disturbances and Inpatient level of care appropriate due to severity of illness  Dispo: The patient is from: Home              Anticipated d/c is to: Home -- once LFTs on decline: 24-48 hours- await HSV PCR              Patient currently is not medically stable to d/c.   Difficult to place patient No       Consultants:  Gastroenterology-signed off   Sch Meds:  Scheduled Meds:  citalopram  20 mg Oral Daily   folic acid  1 mg Oral Daily   multivitamin with minerals  1 tablet Oral Daily   pantoprazole   40 mg Oral BID   polyethylene glycol  17 g Oral Daily   thiamine  100 mg Oral Daily   Or   thiamine  100 mg Intravenous Daily   Continuous Infusions: PRN Meds:.bisacodyl, camphor-menthol, LORazepam **OR** LORazepam, ondansetron **OR** ondansetron (ZOFRAN) IV  Antimicrobials: Anti-infectives (From admission, onward)    None        I have personally reviewed the following labs and images: CBC: Recent Labs  Lab 07/19/20 0855 07/19/20 1524 07/20/20 0254 07/21/20 0104 07/22/20 0051  WBC 6.1 6.0 7.5 7.5 7.1  HGB 8.4* 9.2* 8.7* 8.7* 8.8*  HCT 26.7* 29.0* 27.4* 27.1* 27.4*  MCV 73.6* 73.4* 72.3* 72.5* 74.3*  PLT 147* 161 159 173 186   BMP &GFR Recent Labs  Lab 07/19/20 0855 07/20/20 0254 07/21/20 0104 07/22/20 0051 07/23/20 0220  NA 132* 131* 128* 131* 133*  K 3.9 4.0 4.1 3.7 3.8  CL 101 101 96* 100 102  CO2 22 23 24 23 25   GLUCOSE 122* 115* 111* 108* 116*  BUN 7* 5* 7* 6* 8  CREATININE 0.80 0.86 0.87 0.86 0.89  CALCIUM 8.5* 8.5* 8.4* 8.4* 8.3*  MG  --  1.8 1.7  --   --   PHOS  --  4.0 3.9  3.9  --   --    Estimated Creatinine Clearance: 89.2 mL/min (by C-G formula based on SCr of 0.89 mg/dL). Liver & Pancreas: Recent Labs  Lab 07/19/20 0328 07/20/20 0254 07/21/20 0104 07/22/20 0051 07/23/20 0220  AST 582* 558* 592* 664* 656*  ALT 356* 364* 380* 422* 454*  ALKPHOS 164* 141* 134* 170* 134*  BILITOT 3.2* 6.1* 5.1* 5.1* 5.1*  PROT 6.1* 6.4* 6.2* 6.0* 5.9*  ALBUMIN 2.8* 2.8* 2.6*  2.7* 2.7* 2.6*   No results for input(s): LIPASE, AMYLASE in the last 168 hours. Recent Labs  Lab 07/20/20 0254  AMMONIA 47*   Diabetic: No results for input(s): HGBA1C in the last 72 hours.  No results for input(s): GLUCAP in the last 168 hours. Cardiac Enzymes: Recent Labs  Lab 07/21/20 0104  CKTOTAL 45*   No results for input(s): PROBNP in the last 8760 hours. Coagulation Profile: Recent Labs  Lab 07/19/20 0354 07/20/20 0254  INR 1.4* 1.5*   Thyroid  Function Tests: Recent Labs    07/22/20 0051  FREET4 1.20*   Lipid Profile: No results for input(s): CHOL, HDL, LDLCALC, TRIG, CHOLHDL, LDLDIRECT in the last 72 hours. Anemia Panel: No results for input(s): VITAMINB12, FOLATE, FERRITIN, TIBC, IRON, RETICCTPCT in the last 72 hours.  Urine analysis:    Component Value Date/Time   COLORURINE YELLOW 01/22/2009 2033   APPEARANCEUR CLEAR 01/22/2009 2033   LABSPEC 1.003 (L) 01/22/2009 2033   PHURINE 5.5 01/22/2009 2033   GLUCOSEU  NEGATIVE 01/22/2009 2033   HGBUR NEGATIVE 01/22/2009 2033   [place] NEGATIVE 01/22/2009 2033   [place] NEGATIVE 01/22/2009 2033   PROTEINUR NEGATIVE 01/22/2009 2033   UROBILINOGEN 0.2 01/22/2009 2033   NITRITE NEGATIVE 01/22/2009 2033   LEUKOCYTESUR  01/22/2009 2033    NEGATIVE MICROSCOPIC NOT DONE ON URINES WITH NEGATIVE PROTEIN, BLOOD, LEUKOCYTES, NITRITE, OR GLUCOSE <1000 mg/dL.   Sepsis Labs: Invalid input(s): PROCALCITONIN, [place]  Microbiology: Recent Results (from the past 240 hour(s))  SARS CORONAVIRUS 2 (TAT 6-24 HRS) Nasopharyngeal Nasopharyngeal Swab     Status: None   Collection Time: 07/18/20 11:38 PM   Specimen: Nasopharyngeal Swab  Result Value Ref Range Status   SARS Coronavirus 2 NEGATIVE NEGATIVE Final    Comment: (NOTE) SARS-CoV-2 target nucleic acids are NOT DETECTED.  The SARS-CoV-2 RNA is generally detectable in upper and lower respiratory specimens during the acute phase of infection. Negative results do not preclude SARS-CoV-2 infection, do not rule out co-infections with other pathogens, and should not be used as the sole basis for treatment or other patient management decisions. Negative results must be combined with clinical observations, patient history, and epidemiological information. The expected result is Negative.  Fact Sheet for Patients: SugarRoll.be  Fact Sheet for Healthcare  Providers: https://www.woods-mathews.com/  This test is not yet approved or cleared by the [place] FDA and  has been authorized for detection and/or diagnosis of SARS-CoV-2 by FDA under an Emergency Use Authorization (EUA). This EUA will remain  in effect (meaning this test can be used) for the duration of the COVID-19 declaration under Se ction 564(b)(1) of the Act, 21 U.S.C. section 360bbb-3(b)(1), unless the authorization is terminated or revoked sooner.  Performed at [facility], [place] [address] [place], [place] [address]     Radiology Studies: No results found.    Eulogio Bear DO [facility]  If 7PM-7AM, please contact night-coverage [email or web address] 07/23/2020, 12:03 PM

## 2020-07-23 NOTE — Telephone Encounter (Signed)
Patient has been scheduled for a 4-week hospital follow up with Dr. Lyndel Safe on Wednesday, 08/28/20 at 9:10 am. I left patient a detailed vm with his appt information. Advised that I will also mail him a copy of his appt information. Advised patient to give [place] a call back if he has any other questions or concerns.

## 2020-07-23 NOTE — Progress Notes (Addendum)
Daily Rounding Note  07/23/2020, 3:49 PM  LOS: 4 days   SUBJECTIVE:   Chief complaint:   Rising LFTs. See sonsult note of 7/1.   Patient urged the hospitalist to ask GI to come back and see him.  He was seen late last week by Dr Lyndel Safe, then Dr. Silverio Decamp for anemia and symptomatic anemia. At presentation pt complained of fatigue, dizziness, anorexia, mild nausea without vomiting.  No overt GI bleeding.  Hb 6.6, had been 13 in January 2022.  MCV low at 67, platelets 146 also previously normal.  FOBT negative stool.  Had hyponatremia at 128.  INR a bit elevated at 1.4.  T bili 3.2.  Alk phos 164.  AST/ALT 582/356.  Previously very mild elevated transaminases.  Ultrasound of the liver showed cirrhosis, no liver masses, no ascites,  and normal portal vein circulation.  He received 2 PRBCs, Hgb 6.6 >> 8.7.   Admitted to drinking 6, 12 ounce, beers daily. Had used limited amounts of Ibuprofen a couple of weeks PTA to treat pain from R shoulder area Zoster.     7/1 EGD by Dr. Lyndel Safe showed [place], treated with RFA and then APC. A few, small sessile polyps in the gastric body, 2 larger gastric polyps with superficial ulceration but no active bleeding.  Mild portal hypertensive gastropathy.  Polyp path: hyperplastic polyps.  SB path benign SB mucosa, no inflammation, no villous blunting, no increased intraepithelial lymphocytes.   GI signed off on 7/2, plan was to follow-up with GI in a few weeks. Previous colonoscopy and polypectomy of a tubular adenoma by Dr. Kinnie Feil in [facility] in 2016.    Hgb is stable with Hgb 8.8 this morning.  Studies confirm diagnosis of iron deficiency anemia with low iron and low ferritin.  B12 and folate levels are okay. Unfortunately transaminases moved upward to 664/422 yesterday, 656/454 today.  Alk phos overall improved to 134.  T bili stable for the last 3 days at 5.1.  Na still low but improved now at 133. Hepatitis  Acute serologies negative.  Did not pursue additional labs for AIH, metabollic sources of liver disease.    Pt and wife extremely anxious about LFTs failing to improve and the low Hgb.  He is not sleeping, "afraid I won't wake up".  In terms of GI symptomatology he is having no nausea or vomiting, tolerating solid food, having brown stools.  He had 1 incidence of moderate hypotension 102/50, heart rate 77,  this afternoon and felt dizzy when he stood up to walk around.       OBJECTIVE:         Vital signs in last 24 hours:    Temp:  [98.2 F (36.8 C)-98.8 F (37.1 C)] 98.4 F (36.9 C) (07/05 1356) Pulse Rate:  [62-77] 76 (07/05 1543) Resp:  [17-19] 19 (07/05 1356) BP: (102-141)/(50-77) 138/73 (07/05 1543) SpO2:  [99 %-100 %] 100 % (07/05 1356) Last BM Date: 07/23/20 Filed Weights   07/19/20 0012  Weight: 89.8 kg   General: Anxious, does not look ill. Heart: RRR. Chest: No labored breathing or cough. Abdomen: Soft without tenderness or distention.  Bowel sounds active Extremities: No CCE. Neuro/Psych:  Anxious.  Moves all 4 limbs.  No obvious tremors.  Fully alert and oriented.  Intake/Output from previous day: 07/04 0701 - 07/05 0700 In: 500 [P.O.:500] Out: -   Intake/Output this shift: Total I/O In: 236 [P.O.:236] Out: -  Lab Results: Recent Labs    07/21/20 0104 07/22/20 0051  WBC 7.5 7.1  HGB 8.7* 8.8*  HCT 27.1* 27.4*  PLT 173 186   BMET Recent Labs    07/21/20 0104 07/22/20 0051 07/23/20 0220  NA 128* 131* 133*  K 4.1 3.7 3.8  CL 96* 100 102  CO2 24 23 25   GLUCOSE 111* 108* 116*  BUN 7* 6* 8  CREATININE 0.87 0.86 0.89  CALCIUM 8.4* 8.4* 8.3*   LFT Recent Labs    07/21/20 0104 07/22/20 0051 07/23/20 0220  PROT 6.2* 6.0* 5.9*  ALBUMIN 2.6*  2.7* 2.7* 2.6*  AST 592* 664* 656*  ALT 380* 422* 454*  ALKPHOS 134* 170* 134*  BILITOT 5.1* 5.1* 5.1*  BILIDIR 3.0*  --   --   IBILI 2.1*  --   --    PT/INR No results for input(s): LABPROT, INR  in the last 72 hours. Hepatitis Panel No results for input(s): HEPBSAG, HCVAB, HEPAIGM, HEPBIGM in the last 72 hours.  Studies/Results: No results found.  ASSESMENT:      New dx cirrhosis of live in setting of chronic ETOH abuse.  Significantly elevated transaminases    Blood loss anemia, improved and stable after PRBC.  FOBT negative. 7/1 EGD w RFA, APC of GAVE, hyperplastic gastric polyps, mild Portal gastropathy.        Zoster infection involving dermatome of right shoulder, neck.  Rash improve but has lingering pain. Treated w 7 to 10 d of Valtrex beginning about 7 weeks ago.  Gabapentin in use since then for postherpetic neuralgia.    Hyponatremia improved.     PLAN      Additional Hep B serologies, ANA, AMA, smooth muscle ab, ceruloplasmin, ferritin, alpha 1 AT.            Herpes simplex DNA is pndg.         Provided reassurance and explained to patient, wife and son (on the speaker phone) that we will be checking additional labs to rule out other causes for his elevated LFTs and cirrhosis.  Also explained that his Hgb is actually headed in the right direction and stable, improving.   He is thinking that tomorrow he is going to have a definitive answer to everything but I reiterated at least a few times that many of these labs take several days before they result.  Suspect I will need to remind him of this tomorrow     Going to message Dr. Eliseo Squires to see if she will add anxiolytic or sleep aid for this patient.  Suspect he was drinking and self medicated to deal with his significant level of anxiety.      Azucena Freed  07/23/2020, 3:49 PM Phone [phone]   GI ATTENDING  History, laboratories, x-rays reviewed.  Patient personally seen and examined.  Wife in the room.  Patient admitted with significant iron deficiency anemia and elevated liver tests.  Evidence for cirrhosis by ultrasound.  Given clinical history and a review of older liver tests would suggest alcohol as the  primary cause for underlying liver disease.  However, I believe that he has had an additional insult on top of his chronic liver disease.  Standard viral etiologies have been excluded.  Certainly zoster infection can affect the liver.  Additionally, he could have superimposed insult from medication such as Valtrex and ibuprofen.  Currently on no medications that are hepatotoxic.  The liver tests remain significantly abnormal and prothrombin time elevated,  though they may have peaked.  He looks good otherwise.  Understandably anxious.  No encephalopathy.  We are investigating for other potential causes such as superimposed autoimmune disease.  Supplemental laboratories ordered.  If this is a hepatotoxic insult on top of underlying liver disease, may take some time to improve.  He has been off alcohol for about 1 week.  We might consider advanced imaging.  We will check AFP, if not already done.  No plans for liver biopsy at this point.  We will follow.  Docia Chuck. Geri [place]., M.D. [facility] Division of Gastroenterology

## 2020-07-24 DIAGNOSIS — D689 Coagulation defect, unspecified: Secondary | ICD-10-CM

## 2020-07-24 DIAGNOSIS — K7030 Alcoholic cirrhosis of liver without ascites: Secondary | ICD-10-CM

## 2020-07-24 DIAGNOSIS — R7989 Other specified abnormal findings of blood chemistry: Secondary | ICD-10-CM

## 2020-07-24 LAB — COMPREHENSIVE METABOLIC PANEL
ALT: 465 U/L — ABNORMAL HIGH (ref 0–44)
AST: 712 U/L — ABNORMAL HIGH (ref 15–41)
Albumin: 2.7 g/dL — ABNORMAL LOW (ref 3.5–5.0)
Alkaline Phosphatase: 154 U/L — ABNORMAL HIGH (ref 38–126)
Anion gap: 8 (ref 5–15)
BUN: 5 mg/dL — ABNORMAL LOW (ref 8–23)
CO2: 22 mmol/L (ref 22–32)
Calcium: 8.4 mg/dL — ABNORMAL LOW (ref 8.9–10.3)
Chloride: 102 mmol/L (ref 98–111)
Creatinine, Ser: 0.87 mg/dL (ref 0.61–1.24)
GFR, Estimated: >60 mL/min (ref >60)
Glucose, Bld: 125 mg/dL — ABNORMAL HIGH (ref 70–99)
Potassium: 3.7 mmol/L (ref 3.5–5.1)
Sodium: 132 mmol/L — ABNORMAL LOW (ref 135–145)
Total Bilirubin: 5.4 mg/dL — ABNORMAL HIGH (ref 0.3–1.2)
Total Protein: 6.2 g/dL — ABNORMAL LOW (ref 6.5–8.1)

## 2020-07-24 LAB — HSV DNA BY PCR (REFERENCE LAB)
HSV 1 DNA: NEGATIVE
HSV 2 DNA: NEGATIVE

## 2020-07-24 LAB — PROTIME-INR
INR: 1.4 — ABNORMAL HIGH (ref 0.8–1.2)
Prothrombin Time: 17.3 seconds — ABNORMAL HIGH (ref 11.4–15.2)

## 2020-07-24 LAB — CBC
HCT: 28.6 % — ABNORMAL LOW (ref 39.0–52.0)
Hemoglobin: 9.1 g/dL — ABNORMAL LOW (ref 13.0–17.0)
MCH: 23.6 pg — ABNORMAL LOW (ref 26.0–34.0)
MCHC: 31.8 g/dL (ref 30.0–36.0)
MCV: 74.1 fL — ABNORMAL LOW (ref 80.0–100.0)
Platelets: 200 10*3/uL (ref 150–400)
RBC: 3.86 MIL/uL — ABNORMAL LOW (ref 4.22–5.81)
RDW: 24.2 % — ABNORMAL HIGH (ref 11.5–15.5)
WBC: 7.1 10*3/uL (ref 4.0–10.5)
nRBC: 0 % (ref 0.0–0.2)

## 2020-07-24 LAB — HEPATITIS B SURFACE ANTIBODY,QUALITATIVE: Hep B S Ab: NONREACTIVE

## 2020-07-24 LAB — AMMONIA: Ammonia: 71 umol/L — ABNORMAL HIGH (ref 9–35)

## 2020-07-24 MED ORDER — MELATONIN 5 MG PO TABS
5.0000 mg | ORAL_TABLET | Freq: Every day | ORAL | Status: DC
  Filled 2020-07-24: qty 1

## 2020-07-24 NOTE — Hospital Course (Signed)
65 year old white male with known history of chronic EtOH experience a day HTN Anxiety Reflux Colonoscopy with polypectomy 12/2014 Recent diagnosis right neck herpes zoster Rx antivirals ibuprofen since May  1 month history generalized weakness + lightheadedness with positional change from standing to sitting more SOB  ER work-up 7/1 = hemoglobin 6.6 down from 13 Sodium 119 LFTs 522/350 Alk phos 166 Total bili 2.0  Rx 2 units PRBC ABD USG Cirrhotic liver without masses 7/1 = G AVE Rx RFA + APC with polypectomy of 2 gastric polyps Hepatitis serologies   [-]  GI reconsulted 7/5 ordering ANA, AMA, smooth muscle antibody, ceruloplasmin ferritin alpha-1 antitrypsin and herpes simplex DNA  AST/ALT 712/465 Alk phos 154 Bilirubin 5.4  Hemoglobin 9.1

## 2020-07-24 NOTE — Progress Notes (Signed)
PROGRESS NOTE   Kristopher Ramos  NLG:[id number] DOB: 1955-12-07 DOA: 07/18/2020 PCP: Kristopher Glee., MD  Brief Narrative:  65 year old white male with known history of chronic EtOH experience a day HTN Anxiety Reflux Colonoscopy with polypectomy 12/2014 Recent diagnosis right neck herpes zoster Rx antivirals ibuprofen since May  1 month history generalized weakness + lightheadedness with positional change from standing to sitting more SOB  ER work-up 7/1 = hemoglobin 6.6 down from 13 Sodium 119 LFTs 522/350 Alk phos 166 Total bili 2.0  Rx 2 units PRBC ABD USG-Cirrhotic liver without masses 7/1 Endoscopy Dr. Lyndel Safe GAVE Rx RFA + APC with polypectomy of 2 gastric polyps Hepatitis serologies   [-]  GI reconsulted 7/5 ordering-- ANA, AMA, smooth muscle antibody, ceruloplasmin ferritin alpha-1 antitrypsin and herpes simplex DNA    Hospital-Problem based course  Generalized weakness shortness of breath Secondary to subacute anemia-see below GAVE Rx 7/1 RFA + APC and polypectomy gastric polyps x2 Hemoglobin is stabilized in the 9 range with appropriate rise after 2 units PRBC 7/1 Periodic rechecks Defer to GI--Protonix 40 twice daily Acute liver injury [?  NSAID,?  Valtrex ]superimposed on chronic likely cirrhosis GI reconsulted 7/5-follow-up above labs-no planning for liver biopsy at this juncture Check a.m. INR, bili and labs and follow lab panel ordered as above 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--drinks 6x12 ounce cans daily--sodium stable at this time-replace potassium as needed Recent herpes zoster Gabapentin 100 3 times daily held Appears to have been treated with Valtrex 1 g 3 times daily in the recent past which could have been the precipitant HTN Losartan held from admission Anxiety Continue Celexa 20, hydroxyzine 25 3 times daily as needed itching or anxiety Will dose melatonin  tonight to 10 mg    DVT prophylaxis: SCD Code Status: Full Family Communication: Long discussion on telephone Patient's son Darrick Meigs and with patient at the bedside explaining next steps in advance-appreciate GI input Disposition:  Status is: Inpatient  Remains inpatient appropriate because:Persistent severe electrolyte disturbances, Ongoing active pain requiring inpatient pain management, and IV treatments appropriate due to intensity of illness or inability to take PO  Dispo: The patient is from: Home              Anticipated d/c is to: Home              Patient currently is medically stable to d/c.   Difficult to place patient No     Consultants:  Gastroenterology  Procedures: None currently  Antimicrobials: None   Subjective: Quite anxious-Long discussions at bedside-patient ambulatory not in distress-no chest pain no fever   Objective: Vitals:   07/23/20 1543 07/23/20 2122 07/24/20 0540 07/24/20 1412  BP: 138/73 134/69 128/66 130/72  Pulse: 76 75 62 71  Resp:  _0 Temp:  97.9 F (36.6 C) 98.6 F (37 C) 98.2 F (36.8 C)  TempSrc:   Oral Oral  SpO2:  99% 99% 100%  Weight:      Height:        Intake/Output Summary (Last 24 hours) at 07/24/2020 1649 Last data filed at 07/23/2020 2038 Gross per 24 hour  Intake 120 ml  Output --  Net 120 ml   Filed Weights   07/19/20 0012  Weight: 89.8 kg    Examination:  Awake anxious no tremors mildly icteric thick neck Mallampati 4 S1-S2 no murmur Abdomen soft nontender no rebound no guarding ROM intact no tremor Neurologically  intact no focal deficit power 5/5 reflexes slightly brisk No lower extremity edema  Data Reviewed: personally reviewed   AST/ALT 712/465 Alk phos 154 Bilirubin 5.4  Hemoglobin 9.1  Radiology Studies: No results found.   Scheduled Meds:  citalopram  20 mg Oral Daily   folic acid  1 mg Oral Daily   multivitamin with minerals  1 tablet Oral Daily   pantoprazole  40 mg Oral  BID   polyethylene glycol  17 g Oral Daily   thiamine  100 mg Oral Daily   Or   thiamine  100 mg Intravenous Daily   Continuous Infusions:   LOS: 5 days   Time spent: [place], MD [facility] To contact the attending provider between 7A-7P or the covering provider during after hours 7P-7A, please log into the web site [email or web address] and access using universal [place] password for that web site. If you do not have the password, please call the hospital operator.  07/24/2020, 4:49 PM

## 2020-07-25 ENCOUNTER — Inpatient Hospital Stay (HOSPITAL_COMMUNITY): Payer: 59

## 2020-07-25 ENCOUNTER — Encounter (HOSPITAL_COMMUNITY): Payer: Self-pay | Admitting: Internal Medicine

## 2020-07-25 DIAGNOSIS — K3189 Other diseases of stomach and duodenum: Secondary | ICD-10-CM

## 2020-07-25 DIAGNOSIS — K766 Portal hypertension: Secondary | ICD-10-CM

## 2020-07-25 LAB — COMPREHENSIVE METABOLIC PANEL
ALT: 502 U/L — ABNORMAL HIGH (ref 0–44)
AST: 765 U/L — ABNORMAL HIGH (ref 15–41)
Albumin: 2.8 g/dL — ABNORMAL LOW (ref 3.5–5.0)
Alkaline Phosphatase: 147 U/L — ABNORMAL HIGH (ref 38–126)
Anion gap: 7 (ref 5–15)
BUN: 7 mg/dL — ABNORMAL LOW (ref 8–23)
CO2: 23 mmol/L (ref 22–32)
Calcium: 8.7 mg/dL — ABNORMAL LOW (ref 8.9–10.3)
Chloride: 103 mmol/L (ref 98–111)
Creatinine, Ser: 0.87 mg/dL (ref 0.61–1.24)
GFR, Estimated: >60 mL/min (ref >60)
Glucose, Bld: 102 mg/dL — ABNORMAL HIGH (ref 70–99)
Potassium: 4.1 mmol/L (ref 3.5–5.1)
Sodium: 133 mmol/L — ABNORMAL LOW (ref 135–145)
Total Bilirubin: 6.2 mg/dL — ABNORMAL HIGH (ref 0.3–1.2)
Total Protein: 6.7 g/dL (ref 6.5–8.1)

## 2020-07-25 LAB — ANTI-SMOOTH MUSCLE ANTIBODY, IGG: F-Actin IgG: 7 Units (ref 0–19)

## 2020-07-25 LAB — ALPHA-1-ANTITRYPSIN: A-1 Antitrypsin, Ser: 203 mg/dL — ABNORMAL HIGH (ref 101–187)

## 2020-07-25 LAB — MITOCHONDRIAL ANTIBODIES: Mitochondrial M2 Ab, IgG: <20 Units (ref 0.0–20.0)

## 2020-07-25 LAB — CERULOPLASMIN: Ceruloplasmin: 23.9 mg/dL (ref 16.0–31.0)

## 2020-07-25 MED ORDER — MELATONIN 5 MG PO TABS
10.0000 mg | ORAL_TABLET | Freq: Every day | ORAL | Status: DC
  Filled 2020-07-25 (×4): qty 2

## 2020-07-25 MED ORDER — IOHEXOL 300 MG/ML  SOLN
100.0000 mL | Freq: Once | INTRAMUSCULAR | Status: AC | PRN
  Administered 2020-07-25: 100 mL via INTRAVENOUS

## 2020-07-25 NOTE — Progress Notes (Signed)
PROGRESS NOTE   Kristopher Ramos  LOV:[id number] DOB: [birth year] DOA: 07/18/2020 PCP: Kristopher Glee., MD  Brief Narrative:  65 year old white male with known history of chronic EtOH 6 x 12 oz/day  HTN Anxiety Reflux Colonoscopy with polypectomy 12/2014 Recent diagnosis right neck herpes zoster Rx antivirals ibuprofen since May  1 month history generalized weakness + lightheadedness with positional change from standing to sitting more SOB  ER work-up 7/1 = hemoglobin 6.6 down from 13 Sodium 119 LFTs 522/350 Alk phos 166 Total bili 2.0  Rx 2 units PRBC ABD USG-Cirrhotic liver without masses 7/1 Endoscopy Dr. 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  NSAID [less than 1 % incidence],?  Valtrex [less than 2 % incidence] ]superimposed on chronic likely cirrhosis MELD 3.0=14 GI reconsulted 7/5-follow-up above labs--still pending Bili slightly up Might need biopsy? 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--drinks 6x12 ounce cans daily Sodium and K close to nl Recent herpes zoster Gabapentin 100 3 times daily held Appears to have been treated with Valtrex 1 g 3 times daily in the recent past which could have been the precipitant for wrosening liver disease HTN Losartan held from admission Anxiety Continue Celexa 20, hydroxyzine 25 3 times daily as needed  itching or anxiety Will dose melatonin tonight to 10 mg    DVT prophylaxis: SCD Code Status: Full Family Communication: none today Disposition:  Status is: Inpatient  Remains inpatient appropriate because:Persistent severe electrolyte disturbances, Ongoing active pain requiring inpatient pain management, and IV treatments appropriate due to intensity of illness or inability to take PO  Dispo: The patient is from: Home              Anticipated d/c is to: Home              Patient currently is medically stable to d/c.   Difficult to place patient No     Consultants:  Gastroenterology  Procedures: None currently  Antimicrobials: None   Subjective:  Wondering about next steps Tired No distress No dark stool-no clay Coloured stool No itching   Objective: Vitals:   07/25/20 0416 07/25/20 0455 07/25/20 0848 07/25/20 1134  BP: (!) 108/53 126/72 123/67 130/73  Pulse: 62 64 69 78  Resp: 18  17 17   Temp: 98.9 F (37.2 C)  98.1 F (36.7 C) 98.2 F (36.8 C)  TempSrc: Oral  Oral Oral  SpO2: 98%  100% 100%  Weight:      Height:        Intake/Output Summary (Last 24 hours) at 07/25/2020 1335 Last data filed at 07/25/2020 1100 Gross per 24 hour  Intake 480 ml  Output --  Net 480 ml    Filed Weights   07/19/20 0012  Weight: 89.8 kg    Examination:  Anxious No distress  Mild ict No flap abd soft nt nd no rebound no guard--he does have some mild RUQ tenderness Cta b  Data Reviewed: personally reviewed  AST/ALT 765/502 Alk phos 147 Bilirubin 5.4-->6.2 INR 1.4  Hemoglobin 9.1  Radiology Studies: No results found.   Scheduled Meds:  citalopram  20 mg Oral Daily   folic acid  1 mg Oral Daily   melatonin  5 mg Oral QHS   pantoprazole  40 mg Oral BID   polyethylene glycol  17 g Oral Daily   thiamine  100 mg Oral Daily   Or   thiamine  100 mg Intravenous Daily   Continuous Infusions:   LOS: 6 days   Time spent: 65  Nita Sells,  MD [facility] To contact the attending provider between 7A-7P or the covering provider during after hours 7P-7A, please log into the web site [email or web address] and access using universal [place] password for that web site. If you do not have the password, please call the hospital operator.  07/25/2020, 1:35 PM

## 2020-07-25 NOTE — Progress Notes (Signed)
HISTORY OF PRESENT ILLNESS:  Kristopher Ramos is a 65 y.o. male admitted with fatigue, symptomatic anemia, and markedly elevated liver tests.  No new complaints today.  Slept poorly.  Is ambulating.  Still anxious regarding his overall condition, treatment, and prognosis.  REVIEW OF SYSTEMS:  All non-GI ROS negative except for anxiety and fatigue  Past Medical History:  Diagnosis Date   Alcohol abuse 07/19/2020   Benign essential hypertension 01/08/2015   Generalized anxiety disorder 07/19/2020   GERD (gastroesophageal reflux disease) 07/19/2020   Mixed hyperlipidemia 09/21/2017    Past Surgical History:  Procedure Laterality Date   BIOPSY  07/19/2020   Procedure: BIOPSY;  Surgeon: Jackquline Denmark, MD;  Location: [place];  Service: Endoscopy;;   ESOPHAGOGASTRODUODENOSCOPY (EGD) WITH PROPOFOL N/A 07/19/2020   Procedure: ESOPHAGOGASTRODUODENOSCOPY (EGD) WITH PROPOFOL;  Surgeon: Jackquline Denmark, MD;  Location: [facility] ENDOSCOPY;  Service: Endoscopy;  Laterality: N/A;   GI RADIOFREQUENCY ABLATION  07/19/2020   Procedure: GI RADIOFREQUENCY ABLATION;  Surgeon: Jackquline Denmark, MD;  Location: [facility] ENDOSCOPY;  Service: Endoscopy;;   POLYPECTOMY  07/19/2020   Procedure: POLYPECTOMY;  Surgeon: Jackquline Denmark, MD;  Location[facility] ENDOSCOPY;  Service: Endoscopy;;    Social History Loren Racer  reports that he has never smoked. His smokeless tobacco use includes snuff. No history on file for alcohol use and drug use.  family history is not on file.  No Known Allergies  LABORATORIES: LFTs: 765/502/147/6 0.2 Creatinine 0.87 INR 1.4 Autoimmune serologies pending     PHYSICAL EXAMINATION: Vital signs: BP 130/73 (BP Location: Right Arm)   Pulse 78   Temp 98.2 F (36.8 C) (Oral)   Resp 17   Ht 5\' 6"  (1.676 m)   Wt 89.8 kg   SpO2 100%   BMI 31.96 kg/m   Constitutional: generally well-appearing, no acute distress Psychiatric: alert and oriented x3, cooperative Eyes: extraocular movements intact,  anicteric, conjunctiva pink Mouth: oral pharynx moist, no lesions Neck: supple no lymphadenopathy Cardiovascular: heart regular rate and rhythm, no murmur Lungs: clear to auscultation bilaterally Abdomen: soft, nontender, nondistended, no obvious ascites, no peritoneal signs, normal bowel sounds, no organomegaly Rectal: Omitted Extremities: no clubbing, cyanosis, or lower extremity edema bilaterally Skin: no lesions on visible extremities Neuro: No focal deficits. No asterixis.     ASSESSMENT:  1.  Alcoholic cirrhosis 2.  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  Status post endoscopic hemostatic therapy.  Colonoscopy and upper endoscopy as previously outlined. 3.  Markedly elevated liver tests.  It appears that he has suffered an acute on top of chronic liver insult.  Possible etiologies include zoster infection, Valtrex treatment, NSAIDs.  Other more standard infectious hepatitis entities are negative.  Other etiologies, such as autoimmune, or pending.  Liver performance is stable.  I would like to further evaluate the liver and upper abdomen with CT imaging.  He may need a liver biopsy prior to discharge.  We discussed all of this in detail   PLAN:  1.  Schedule contrast-enhanced CT scan of the abdomen 2.  Follow-up on pending serologies 3.  Consider liver biopsy 4.  Iron replacement daily indefinitely Will continue to follow  Shamar Engelmann N. Geri Seminole., M.D. [facility] Division of Gastroenterology

## 2020-07-26 ENCOUNTER — Inpatient Hospital Stay (HOSPITAL_COMMUNITY): Payer: 59

## 2020-07-26 LAB — CBC WITH DIFFERENTIAL/PLATELET
Abs Immature Granulocytes: 0.02 10*3/uL (ref 0.00–0.07)
Basophils Absolute: 0.1 10*3/uL (ref 0.0–0.1)
Basophils Relative: 1 %
Eosinophils Absolute: 0.4 10*3/uL (ref 0.0–0.5)
Eosinophils Relative: 5 %
HCT: 27.4 % — ABNORMAL LOW (ref 39.0–52.0)
Hemoglobin: 8.5 g/dL — ABNORMAL LOW (ref 13.0–17.0)
Immature Granulocytes: 0 %
Lymphocytes Relative: 18 %
Lymphs Abs: 1.2 10*3/uL (ref 0.7–4.0)
MCH: 23.3 pg — ABNORMAL LOW (ref 26.0–34.0)
MCHC: 31 g/dL (ref 30.0–36.0)
MCV: 75.1 fL — ABNORMAL LOW (ref 80.0–100.0)
Monocytes Absolute: 1.1 10*3/uL — ABNORMAL HIGH (ref 0.1–1.0)
Monocytes Relative: 17 %
Neutro Abs: 3.8 10*3/uL (ref 1.7–7.7)
Neutrophils Relative %: 59 %
Platelets: 189 10*3/uL (ref 150–400)
RBC: 3.65 MIL/uL — ABNORMAL LOW (ref 4.22–5.81)
RDW: 25.6 % — ABNORMAL HIGH (ref 11.5–15.5)
WBC: 6.5 10*3/uL (ref 4.0–10.5)
nRBC: 0 % (ref 0.0–0.2)

## 2020-07-26 LAB — COMPREHENSIVE METABOLIC PANEL
ALT: 430 U/L — ABNORMAL HIGH (ref 0–44)
AST: 627 U/L — ABNORMAL HIGH (ref 15–41)
Albumin: 2.5 g/dL — ABNORMAL LOW (ref 3.5–5.0)
Alkaline Phosphatase: 135 U/L — ABNORMAL HIGH (ref 38–126)
Anion gap: 8 (ref 5–15)
BUN: 7 mg/dL — ABNORMAL LOW (ref 8–23)
CO2: 20 mmol/L — ABNORMAL LOW (ref 22–32)
Calcium: 8.3 mg/dL — ABNORMAL LOW (ref 8.9–10.3)
Chloride: 103 mmol/L (ref 98–111)
Creatinine, Ser: 0.87 mg/dL (ref 0.61–1.24)
GFR, Estimated: >60 mL/min (ref >60)
Glucose, Bld: 125 mg/dL — ABNORMAL HIGH (ref 70–99)
Potassium: 3.6 mmol/L (ref 3.5–5.1)
Sodium: 131 mmol/L — ABNORMAL LOW (ref 135–145)
Total Bilirubin: 5.1 mg/dL — ABNORMAL HIGH (ref 0.3–1.2)
Total Protein: 6 g/dL — ABNORMAL LOW (ref 6.5–8.1)

## 2020-07-26 LAB — PROTIME-INR
INR: 1.5 — ABNORMAL HIGH (ref 0.8–1.2)
Prothrombin Time: 18 seconds — ABNORMAL HIGH (ref 11.4–15.2)

## 2020-07-26 LAB — AFP TUMOR MARKER: AFP, Serum, Tumor Marker: 2.9 ng/mL (ref 0.0–8.4)

## 2020-07-26 IMAGING — MR MR ABDOMEN WO/W CM
18 series · 47 of 48 positions shown · IV contrast (Gadavist)
Comparison: CT [DATE]

CLINICAL DATA: Further evaluation of abnormality seen on recent CT

EXAM:
MRI ABDOMEN WITHOUT AND WITH CONTRAST
TECHNIQUE: Multiplanar multisequence MR imaging of the abdomen was performed
both before and after the administration of intravenous contrast.
CONTRAST:  8.9mL GADAVIST GADOBUTROL 1 MMOL/ML IV SOLN

[Series 4: cor haste · coronal · 6.0mm · 1.25mm/px · 2 of 38 slices shown]
[im 1/38]
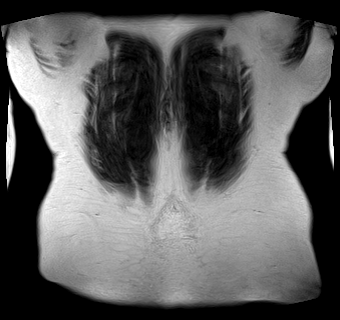
[im 38/38]
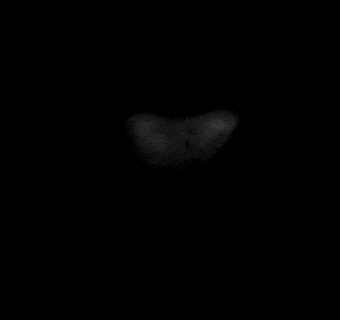

[Series 5: ax haste · axial · 6.0mm · 1.25mm/px · z∈[-279,-13]mm · 2 of 38 slices shown]
[im 1/38]
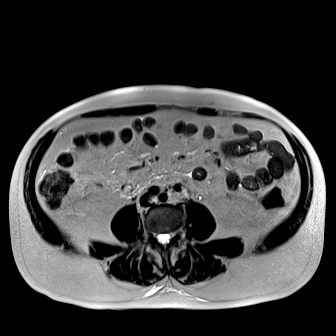
[im 38/38]
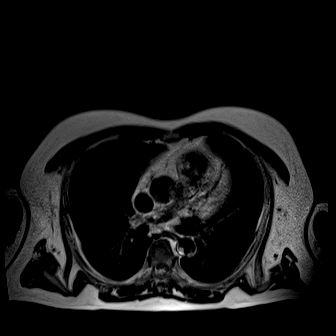

[Series 6: T2 fat-sat · axial · 6.0mm · 1.38mm/px · z∈[-279,-13]mm · 2 of 38 slices shown]
[im 1/38]
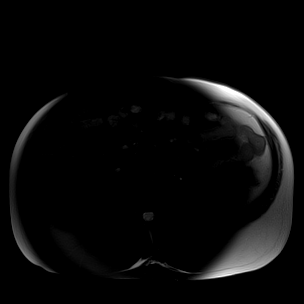
[im 38/38]
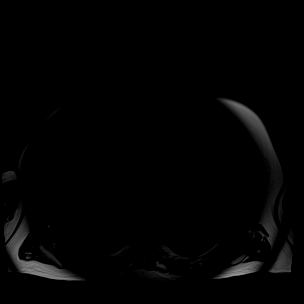

[Series 7: t1_vibe_opp-in_tra_p4_bh · axial · 3.0mm · 1.31mm/px · z∈[-296,-59]mm · 3 of 80 slices shown (1 of 2)]
[im 1/80]
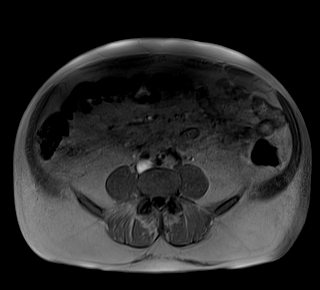
[im 40/80]
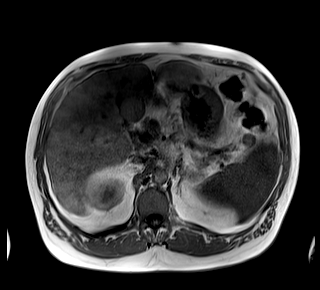
[im 80/80]
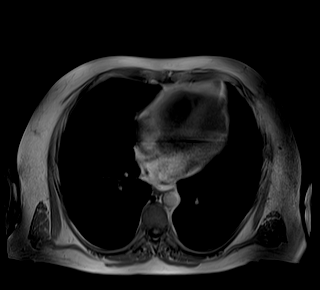

[Series 7: t1_vibe_opp-in_tra_p4_bh · axial · 3.0mm · 1.31mm/px · z∈[-296,-59]mm · 3 of 80 slices shown (2 of 2)]
[im 1/80]
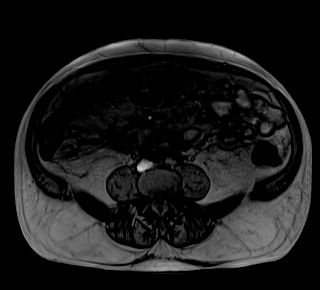
[im 40/80]
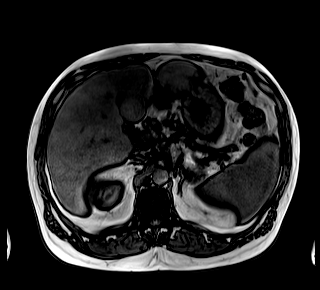
[im 80/80]
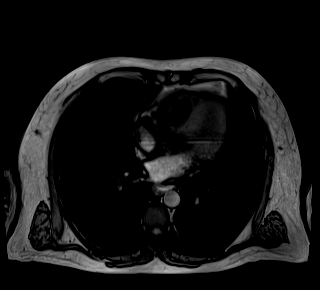

[Series 8: DWI · axial · 6.0mm · 1.36mm/px · z∈[-279,-13]mm · 4 of 114 slices shown (1 of 2)]
[im 1/114]
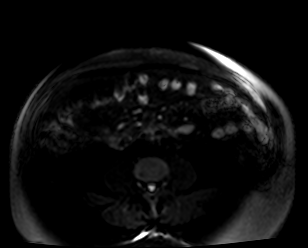
[im 38/114]
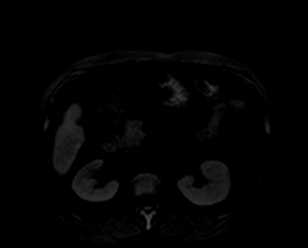
[im 76/114]
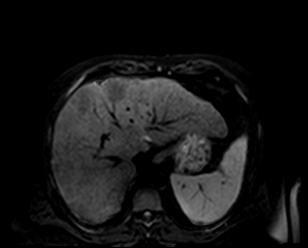
[im 114/114]
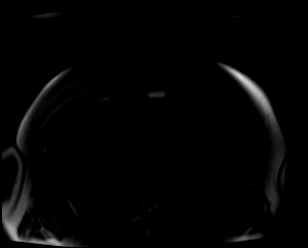

[Series 9: DWI · axial · 6.0mm · 1.36mm/px · 1 of 38 slices shown (2 of 2)]
[im 1/38]
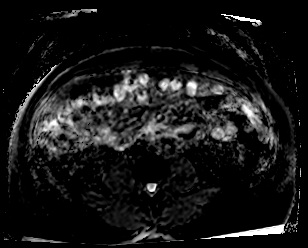

[Series 10: bSSFP · axial · 6.0mm · 0.82mm/px · 1 of 38 slices shown]
[im 1/38]
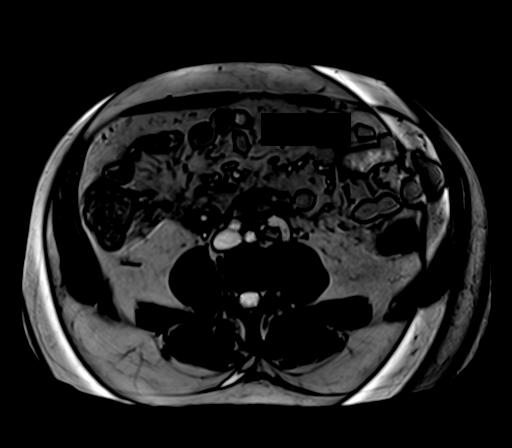

[Series 11: t1_vibe_fs_tra_p4_bh_pre · axial · 3.0mm · 1.31mm/px · z∈[-296,-59]mm · 3 of 80 slices shown]
[im 1/80]
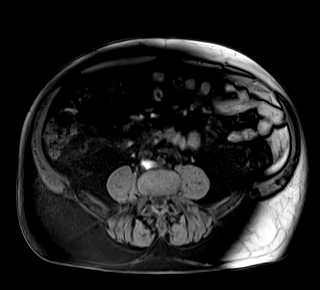
[im 40/80]
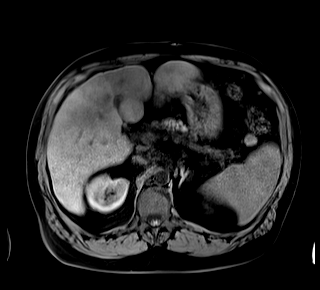
[im 80/80]
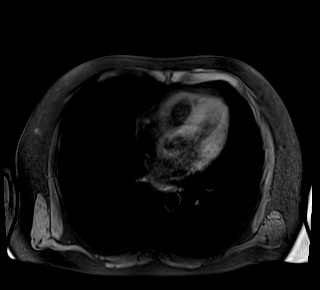

[Series 13: t1_vibe_fs_tra_p4_bh_post · axial · 3.0mm · 1.31mm/px · z∈[-296,-59]mm · 3 of 80 slices shown (1 of 4)]
[im 1/80]
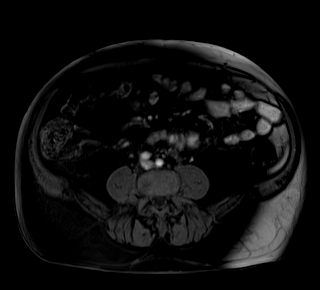
[im 40/80]
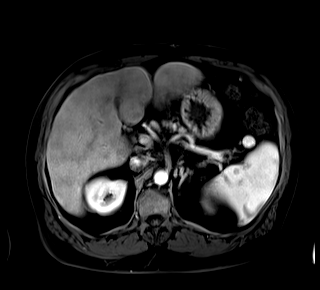
[im 80/80]
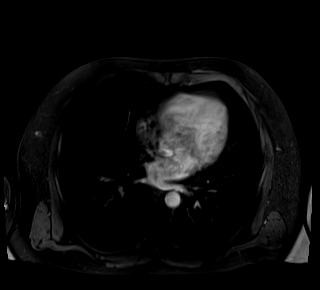

[Series 14: t1_vibe_fs_tra_p4_bh_post_sub · axial · 3.0mm · 1.31mm/px · z∈[-296,-59]mm · 3 of 80 slices shown (1 of 4)]
[im 1/80]
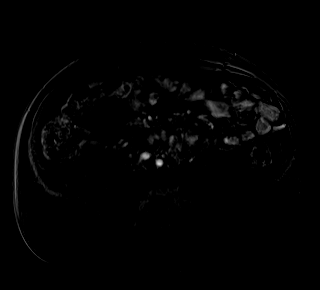
[im 40/80]
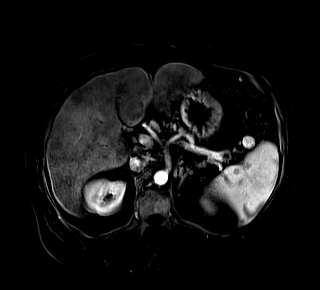
[im 80/80]
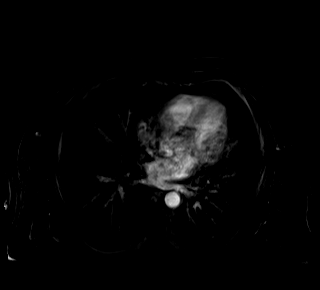

[Series 15: t1_vibe_fs_tra_p4_bh_post · axial · 3.0mm · 1.31mm/px · z∈[-296,-59]mm · 3 of 80 slices shown (2 of 4)]
[im 1/80]
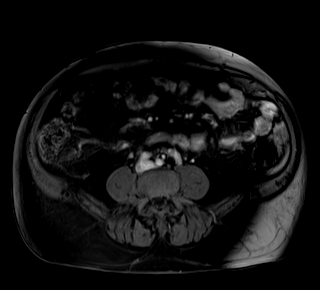
[im 40/80]
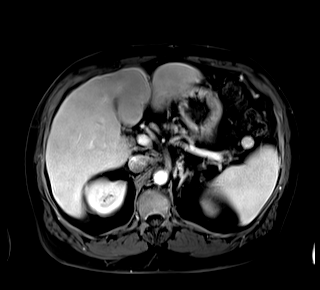
[im 80/80]
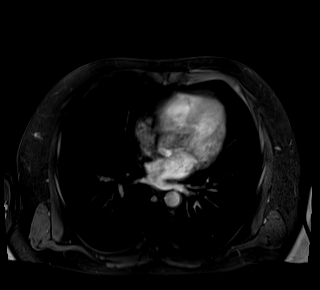

[Series 16: t1_vibe_fs_tra_p4_bh_post_sub · axial · 3.0mm · 1.31mm/px · z∈[-296,-59]mm · 3 of 80 slices shown (2 of 4)]
[im 1/80]
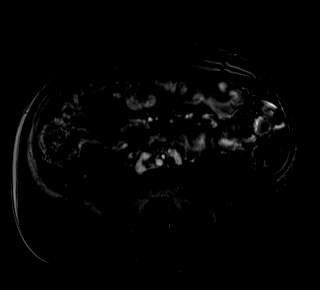
[im 40/80]
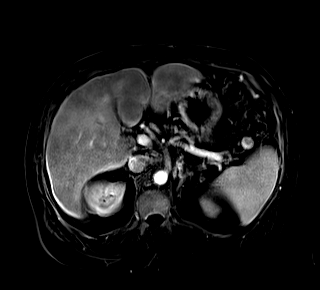
[im 80/80]
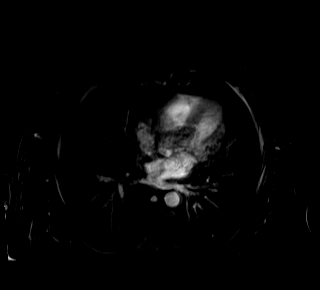

[Series 17: t1_vibe_fs_tra_p4_bh_post · axial · 3.0mm · 1.31mm/px · z∈[-296,-59]mm · 3 of 80 slices shown (3 of 4)]
[im 1/80]
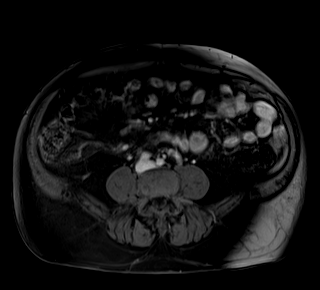
[im 40/80]
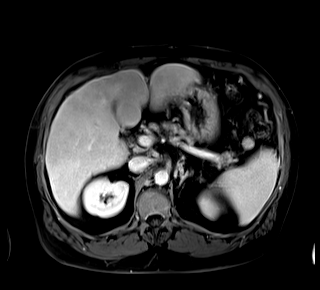
[im 80/80]
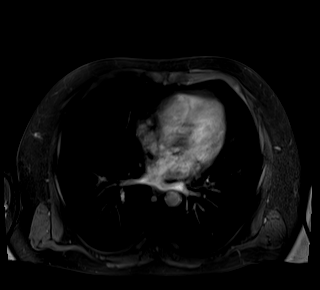

[Series 18: t1_vibe_fs_tra_p4_bh_post_sub · axial · 3.0mm · 1.31mm/px · z∈[-296,-59]mm · 3 of 80 slices shown (3 of 4)]
[im 1/80]
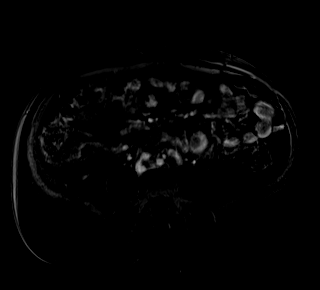
[im 40/80]
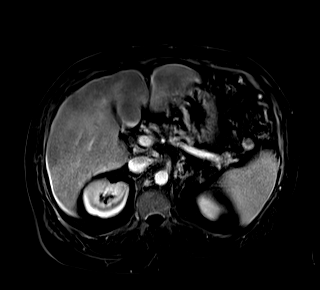
[im 80/80]
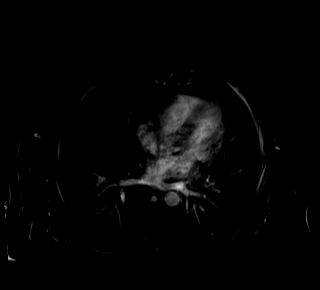

[Series 19: t1_vibe_fs_tra_p4_bh_post · axial · 3.0mm · 1.31mm/px · z∈[-296,-59]mm · 3 of 80 slices shown (4 of 4)]
[im 1/80]
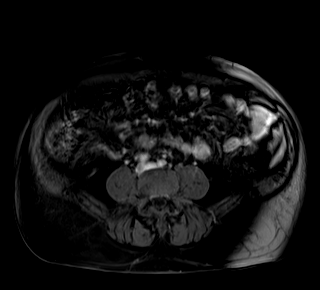
[im 40/80]
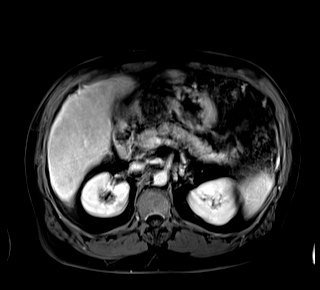
[im 80/80]
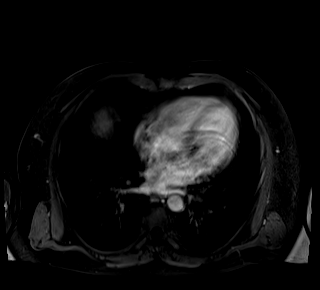

[Series 20: t1_vibe_fs_tra_p4_bh_post_sub · axial · 3.0mm · 1.31mm/px · z∈[-296,-59]mm · 3 of 80 slices shown (4 of 4)]
[im 1/80]
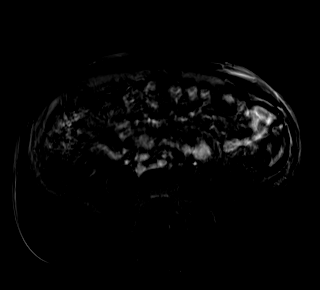
[im 40/80]
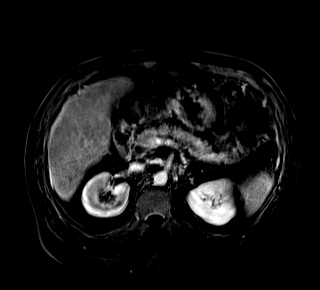
[im 80/80]
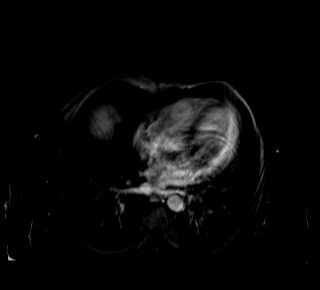

[Series 21: T1 dynamic post-contrast · coronal · 3.0mm · 1.46mm/px · 2 of 88 slices shown]
[im 1/88]
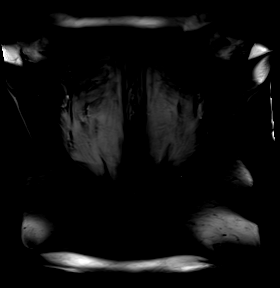
[im 44/88]
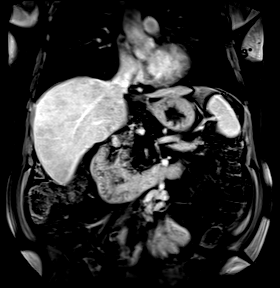

[47 of 48 positions shown; findings below may reference images not displayed]

FINDINGS: Lower chest: No acute abnormality.

Hepatobiliary: Nodular hepatic contour compatible with history of
cirrhosis. Corresponding with findings on prior CT dome of liver
there is a non masslike area of heterogeneous signal intensity which
demonstrates some increased T2 signal with low intrinsic T1 signal
which does not demonstrate demonstrate immediate postcontrast
enhancement but does demonstrate linear serpiginous band of
enhancement on more delayed imaging, most consistent with fibrosis.
There is a single arterially hyperenhancing 7 mm focus in the
peripheral right lobe of the liver on image 24/14 which demonstrates
washout but without definite suspicious features. No additional
arterially enhancing hepatic lesions.

Gallbladder is decompressed otherwise unremarkable. No biliary
ductal dilation.

Pancreas: Intrinsic T1 signal of the pancreatic parenchyma is within
normal limits. No pancreatic ductal dilation. No cystic or solid
enhancing pancreatic masses.

Spleen: Mild splenomegaly. Small which shaped area hypoenhancement
along the medial aspect of the spleen likely representing a small
infarction.

Adrenals/Urinary Tract: No masses identified. No evidence of
hydronephrosis.

Stomach/Bowel: Gastric diverticulum. Duodenal diverticulum. No
pathologic dilation of small bowel.

Vascular/Lymphatic: Abdominal aortic aneurysm. The portal, splenic
and superior mesenteric veins are patent. Prominent periportal lymph
nodes measuring up to 7 mm. Pathologically enlarged abdominal lymph
nodes.

Other:  No abdominal ascites.

Musculoskeletal: No suspicious osseous lesions.
IMPRESSION: 1. Heterogeneous signal intensity hepatic dome, corresponding with
the finding on prior CT is most consistent with fibrosis.
2. Cirrhotic hepatic morphology with mild splenomegaly suggestive
portal hypertension. No ascites.
3. Arterially enhancing 7 mm focus in the peripheral right lobe of
the liver which demonstrates washout but without definite suspicious
features, consistent with ERXLEBEN category 3 hepatic lesion.

## 2020-07-26 MED ORDER — GADOBUTROL 1 MMOL/ML IV SOLN
8.9000 mL | Freq: Once | INTRAVENOUS | Status: AC | PRN
  Administered 2020-07-26: 8.9 mL via INTRAVENOUS

## 2020-07-26 NOTE — Progress Notes (Signed)
PROGRESS NOTE   Kristopher Ramos  NID:[id number] DOB: 11/30/1955 DOA: 07/18/2020 PCP: Kristopher Glee., MD  Brief Narrative:  65 year old white male with known history of chronic EtOH 6 x 12 oz/day  HTN Anxiety Reflux Colonoscopy with polypectomy 12/2014 Recent diagnosis right neck herpes zoster Rx antivirals ibuprofen since May  1 month history generalized weakness + lightheadedness with positional change from standing to sitting more SOB  ER work-up 7/1 = hemoglobin 6.6 down from 13 Sodium 119LFTs 522/350Alk phos 166Total bili 2.0  Rx 2 units PRBCABD USG-Cirrhotic liver without masses 7/1 Endoscopy Dr. 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  NSAID [less than 1 % incidence],?  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 Mild metabolic acidosis CO2 14-ERXVQ trend Secondary to potomania--drinks 6x12 ounce cans daily-he is convinced that he will stop drinking after our discussions during hospital stay Sodium and K close to nl Recent herpes zoster Gabapentin 100 3  times daily held Appears to have been treated with Valtrex 1 g 3 times daily in the recent past which could have been the precipitant for wrosening liver disease HTN Losartan held from admission Anxiety Continue Celexa 20, hydroxyzine 25 3 times daily as needed itching or anxiety Declines use melatonin    DVT prophylaxis: SCD Code Status: Full Family Communication: none today Disposition:  Status is: Inpatient  Remains inpatient appropriate because:Persistent severe electrolyte disturbances, Ongoing active pain requiring inpatient pain management, and IV treatments appropriate due to intensity of illness or inability to take PO  Dispo: The patient is from: Home              Anticipated d/c is to: Home              Patient currently is medically stable to d/c.   Difficult to place patient No     Consultants:  Gastroenterology  Procedures: None currently  Antimicrobials: None   Subjective:  Doing fair Less tired slept a little better seems a little bit reviewed that his liver enzymes are better No bleeding Eating drinking - fever-- chills-- nausea-+ vomiting--ambulatory    Objective: Vitals:   07/25/20 1934 07/26/20 0016 07/26/20 0451 07/26/20 0744  BP: (!) 144/75 124/61 121/73 120/69  Pulse: 71 66 63 66  Resp: _0 Temp: 99.3 F (37.4 C) 99.4 F (37.4 C) 99.3 F (37.4 C) 98.4 F (36.9 C)  TempSrc: Oral Oral Oral Oral  SpO2: 100% 98% 96% 99%  Weight:      Height:        Intake/Output Summary (Last 24 hours) at 07/26/2020 1046 Last data filed at 07/25/2020 2017 Gross per 24 hour  Intake 700 ml  Output --  Net 700 ml    [employer]  07/19/20 0012  Weight: 89.8 kg    Examination:  Still anxious but less so Thick neck Mallampati 4 Chest clear Abdomen soft cannot appreciate liver No lower extremity edema No flap Neurologically intact moves all 4 limbs equally  Data Reviewed: personally reviewed   AST/ALT 627/430 Alk phos  147 Bilirubin 5.1, INR 1.5 Hemoglobin 8.5 platelet 189 white count normal  Radiology Studies: CT ABDOMEN W CONTRAST  Result Date: 07/25/2020 CLINICAL DATA:  Jaundice.  Cirrhosis. EXAM: CT ABDOMEN WITH CONTRAST TECHNIQUE: Multidetector CT imaging of the abdomen was performed using the standard protocol following bolus administration of intravenous contrast. CONTRAST:  159m OMNIPAQUE IOHEXOL 300 MG/ML  SOLN COMPARISON:  01/22/2009 FINDINGS: Lower chest: Unremarkable. Hepatobiliary: Nodular liver contour compatible with the reported clinical history of cirrhosis. Regional heterogeneous enhancement identified in the dome of the right liver. Gallbladder is decompressed. Insert normal bili Pancreas: No focal mass lesion. No dilatation of the main duct. No intraparenchymal cyst. No peripancreatic edema. Spleen: Tiny wedge-shaped area of decreased enhancement identified in the medial spleen (18/3) suggesting small infarct. No substantial splenomegaly. Adrenals/Urinary Tract: No adrenal nodule or mass. Kidneys unremarkable. Stomach/Bowel: Fundal diverticulum noted. Stomach otherwise unremarkable. Duodenum is normally positioned as is the ligament of Treitz. Duodenal diverticulum noted. No small bowel or colonic dilatation within the visualized abdomen. Vascular/Lymphatic: There is moderate calcified aortic atherosclerosis without aneurysm. Portal vein, superior mesenteric vein, and splenic vein are patent. 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 There is no gastrohepatic or hepatoduodenal ligament lymphadenopathy. No retroperitoneal or mesenteric lymphadenopathy. Other: No intraperitoneal free fluid. Musculoskeletal: No worrisome lytic or sclerotic osseous abnormality. IMPRESSION: 1. Cirrhotic liver morphology with an area of subtle heterogeneous enhancement towards the dome. MRI abdomen with and without contrast recommended to further evaluate. 2. No intra or extrahepatic biliary duct  dilatation. 3. No overt findings of portal venous hypertension. Electronically Signed   By: EMisty StanleyM.D.   On: 07/25/2020 18:01     Scheduled Meds:  citalopram  20 mg Oral Daily   folic acid  1 mg Oral Daily   melatonin  10 mg Oral QHS   pantoprazole  40 mg Oral BID   polyethylene glycol  17 g Oral Daily   thiamine  100 mg Oral Daily   Or   thiamine  100 mg Intravenous Daily   Continuous Infusions:   LOS: 7 days   Time spent: 3[place] MD [facility] To contact the attending provider between 7A-7P or the covering provider during after hours 7P-7A, please log into the web site [email or web address] and access using universal [place] password for that web site. If you do not have the password, please call the hospital operator.  07/26/2020, 10:46 AM

## 2020-07-26 NOTE — Progress Notes (Signed)
HISTORY OF PRESENT ILLNESS:  Kristopher Ramos is a 65 y.o. male admitted with symptomatic anemia and malaise.  Noted to have markedly elevated liver test.  Underlying cirrhosis felt secondary to alcohol.  Has been clinically stable.  Underwent colonoscopy and upper endoscopy as previously noted.  APC of gastric GAVE. Feeling better today. Walking. Wife in room.  REVIEW OF SYSTEMS:  All non-GI ROS negative except for fatigue  Past Medical History:  Diagnosis Date   Alcohol abuse 07/19/2020   Benign essential hypertension 01/08/2015   Generalized anxiety disorder 07/19/2020   GERD (gastroesophageal reflux disease) 07/19/2020   Mixed hyperlipidemia 09/21/2017    Past Surgical History:  Procedure Laterality Date   BIOPSY  07/19/2020   Procedure: BIOPSY;  Surgeon: Jackquline Denmark, MD;  Location: [place];  Service: Endoscopy;;   ESOPHAGOGASTRODUODENOSCOPY (EGD) WITH PROPOFOL N/A 07/19/2020   Procedure: ESOPHAGOGASTRODUODENOSCOPY (EGD) WITH PROPOFOL;  Surgeon: Jackquline Denmark, MD;  Location: [facility] ENDOSCOPY;  Service: Endoscopy;  Laterality: N/A;   GI RADIOFREQUENCY ABLATION  07/19/2020   Procedure: GI RADIOFREQUENCY ABLATION;  Surgeon: Jackquline Denmark, MD;  Location: [facility] ENDOSCOPY;  Service: Endoscopy;;   POLYPECTOMY  07/19/2020   Procedure: POLYPECTOMY;  Surgeon: Jackquline Denmark, MD;  Location: [facility] ENDOSCOPY;  Service: Endoscopy;;    Social History Loren Racer  reports that he has never smoked. His smokeless tobacco use includes snuff. No history on file for alcohol use and drug use.  family history is not on file.  No Known Allergies  LABORATORIES: Liver tests 627/430/135/5.1 INR 1.5 Sodium 131 Hemoglobin 8.5 Platelets 189  X-rays: CT scan shows changes consistent with cirrhosis.  There is one area of subtle heterogeneous change near the dome of the liver for which MRI has been recommended.  No obvious portal hypertensive changes or other abnormalities.     PHYSICAL EXAMINATION: Vital  signs: BP 136/72 (BP Location: Right Arm)   Pulse 73   Temp 98.2 F (36.8 C) (Axillary)   Resp 20   Ht 5\' 6"  (1.676 m)   Wt 89.8 kg   SpO2 100%   BMI 31.96 kg/m   Constitutional: generally well-appearing, no acute distress Psychiatric: alert and oriented x3, cooperative Eyes: extraocular movements intact, anicteric, conjunctiva pink Mouth: oral pharynx moist, no lesions Neck: supple no lymphadenopathy Cardiovascular: heart regular rate and rhythm, no murmur Lungs: clear to auscultation bilaterally Abdomen: soft, nontender, nondistended, no obvious ascites, no peritoneal signs, normal bowel sounds, no organomegaly Rectal: Omitted Extremities: no lower extremity edema bilaterally Skin: no lesions on visible extremities Neuro: No focal deficits. No asterixis.     ASSESSMENT:  1.  Alcoholic cirrhosis. 2.  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  Status post EGD with APC. 3.  Unremarkable colonoscopy 4.  Markedly elevated liver test.  Felt to have acute on chronic liver injury.  Differential previously discussed.  Work-up for other entities all negative.  Autoimmune pending.  Liver tests a bit better today. 5.  Questionable hepatic abnormality on CT   PLAN:  1.  MRI of the liver to clarify questionable abnormality on CT 2.  Follow-up autoimmune studies 3.  Continue to trend liver test 4.  If autoimmune studies negative and liver test do not show improvement, then I would recommend liver biopsy (possibly Monday).  Discussed with patient and wife

## 2020-07-27 DIAGNOSIS — R932 Abnormal findings on diagnostic imaging of liver and biliary tract: Secondary | ICD-10-CM

## 2020-07-27 LAB — CBC WITH DIFFERENTIAL/PLATELET
Abs Immature Granulocytes: 0.02 10*3/uL (ref 0.00–0.07)
Basophils Absolute: 0.1 10*3/uL (ref 0.0–0.1)
Basophils Relative: 1 %
Eosinophils Absolute: 0.3 10*3/uL (ref 0.0–0.5)
Eosinophils Relative: 5 %
HCT: 30.1 % — ABNORMAL LOW (ref 39.0–52.0)
Hemoglobin: 9 g/dL — ABNORMAL LOW (ref 13.0–17.0)
Immature Granulocytes: 0 %
Lymphocytes Relative: 20 %
Lymphs Abs: 1.2 10*3/uL (ref 0.7–4.0)
MCH: 23.4 pg — ABNORMAL LOW (ref 26.0–34.0)
MCHC: 29.9 g/dL — ABNORMAL LOW (ref 30.0–36.0)
MCV: 78.2 fL — ABNORMAL LOW (ref 80.0–100.0)
Monocytes Absolute: 0.9 10*3/uL (ref 0.1–1.0)
Monocytes Relative: 14 %
Neutro Abs: 3.8 10*3/uL (ref 1.7–7.7)
Neutrophils Relative %: 60 %
Platelets: 196 10*3/uL (ref 150–400)
RBC: 3.85 MIL/uL — ABNORMAL LOW (ref 4.22–5.81)
RDW: 26.5 % — ABNORMAL HIGH (ref 11.5–15.5)
WBC: 6.3 10*3/uL (ref 4.0–10.5)
nRBC: 0 % (ref 0.0–0.2)

## 2020-07-27 LAB — COMPREHENSIVE METABOLIC PANEL
ALT: 413 U/L — ABNORMAL HIGH (ref 0–44)
AST: 560 U/L — ABNORMAL HIGH (ref 15–41)
Albumin: 2.4 g/dL — ABNORMAL LOW (ref 3.5–5.0)
Alkaline Phosphatase: 147 U/L — ABNORMAL HIGH (ref 38–126)
Anion gap: 9 (ref 5–15)
BUN: 9 mg/dL (ref 8–23)
CO2: 18 mmol/L — ABNORMAL LOW (ref 22–32)
Calcium: 8.3 mg/dL — ABNORMAL LOW (ref 8.9–10.3)
Chloride: 105 mmol/L (ref 98–111)
Creatinine, Ser: 0.87 mg/dL (ref 0.61–1.24)
GFR, Estimated: >60 mL/min (ref >60)
Glucose, Bld: 179 mg/dL — ABNORMAL HIGH (ref 70–99)
Potassium: 4.1 mmol/L (ref 3.5–5.1)
Sodium: 132 mmol/L — ABNORMAL LOW (ref 135–145)
Total Bilirubin: 6.1 mg/dL — ABNORMAL HIGH (ref 0.3–1.2)
Total Protein: 6.2 g/dL — ABNORMAL LOW (ref 6.5–8.1)

## 2020-07-27 MED ORDER — HYDROXYZINE HCL 10 MG PO TABS
10.0000 mg | ORAL_TABLET | Freq: Three times a day (TID) | ORAL | Status: DC
  Filled 2020-07-27 (×11): qty 1

## 2020-07-27 NOTE — Progress Notes (Addendum)
Progress Note  Chief Complaint:    anemia and abnormal liver tests.      ASSESSMENT / PLAN:    Brief History:  65 yo male with history of Etoh abuse, HTN, GAD, HL, GERD. Patient admitted 07/19/20 with symptomatic IDA ( hgb 6.6) and markedly elevated liver tests.  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 Hgb improved to 9.0 post 2 units of PRBC.   # Elevated liver chemistries / new diagnosis of cirrhosis by [place] . Elevated liver chemistries probably multifactorial ( Etoh , recent Zoster infection, possibly component of drug induced liver injury). Etiology of cirrhosis likely Etoh. Labs for viral Hep A,, B, and C are negative. ANA is pending but ASMA, AMA, ceruloplasmin, A1AT are all negative.  --Await ANA results. If positive then IgG --Minimal improvement in liver enzymes and slightly more cholestatic. He may need a liver biopsy.enhancement towards dome of liver demonstrated on CT scan . He had an MRI yesterday  --[place] screening: Normal AFP.  MRI shows an arterially enhancing 7 mm focus in the peripheral right lobe of the liver which demonstrates washout but without definite suspicious features, consistent with a LI-RADS category 3 hepatic lesion. Lesion indeterminate..  --Varices screening: No esophageal varices on EGD this admission  --Outpatient vaccination for HBV. Will need to confirm immunity to HAV and get that vaccine as well if indicated.  --We discussed Etoh use. Patient says he will not drink again. I strongly advised him to have back up plan such as AA f.   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 Treated with RFA followed by APC - Mild portal hypertensive gastropathy. - Gastric polyps s/p polypectomy (x 2)  FINAL MICROSCOPIC DIAGNOSIS:   A. SMALL BOWEL, BIOPSY:  -  Benign small bowel mucosa  -  No acute inflammation, villous blunting or increased intraepithelial  lymphocytes   B. STOMACH, POLYPECTOMY:   -  Hyperplastic gastric polyp(s), inflamed,  -  No H. pylori, intestinal metaplasia or malignancy identified   07/25/20 CTAP w/ contrast IMPRESSION: 1. Cirrhotic liver morphology with an area of subtle heterogeneous enhancement towards the dome. MRI abdomen with and without contrast recommended to further evaluate. 2. No intra or extrahepatic biliary duct dilatation. 3. No overt findings of portal venous hypertension.  07/26/20 MRI IMPRESSION: 1. Heterogeneous signal intensity hepatic dome, corresponding with the finding on prior CT is most consistent with fibrosis. 2. Cirrhotic hepatic morphology with mild splenomegaly suggestive portal hypertension. No ascites. 3. Arterially enhancing 7 mm focus in the peripheral right lobe of the liver which demonstrates washout but without definite suspicious features, consistent with a LI-RADS category 3 hepatic lesion    SUBJECTIVE:   No complaints. They have seen MRI results and waiting to discuss with GI    OBJECTIVE:    Scheduled inpatient medications:   citalopram  20 mg Oral Daily   folic acid  1 mg Oral Daily   melatonin  10 mg Oral QHS   pantoprazole  40 mg Oral BID   polyethylene glycol  17 g Oral Daily   thiamine  100 mg Oral Daily   Or   thiamine  100 mg Intravenous Daily   Continuous inpatient infusions:  PRN inpatient medications: bisacodyl, camphor-menthol, hydrOXYzine, ondansetron **OR** ondansetron (ZOFRAN) IV  Vital signs in last 24 hours: Temp:  [98.2 F (36.8 C)-99.2 F (37.3 C)] 98.5 F (36.9 C) (07/09 0755) Pulse Rate:  [61-73] 61 (07/09 0755)  Resp:  [14-20] 18 (07/09 0755) BP: (102-136)/(54-72) 129/69 (07/09 0755) SpO2:  [96 %-100 %] 99 % (07/09 0755) Last BM Date: 07/25/20 No intake or output data in the 24 hours ending 07/27/20 1354   Physical Exam:  General: Alert male in NAD Neurologic: Alert and oriented Psych: Pleasant. Cooperative.   Filed Weights   07/19/20 0012  Weight: 89.8 kg     Intake/Output from previous day: No intake/output data recorded. Intake/Output this shift: No intake/output data recorded.    Lab Results: Recent Labs    07/26/20 0118 07/27/20 1016  WBC 6.5 6.3  HGB 8.5* 9.0*  HCT 27.4* 30.1*  PLT 189 196   BMET Recent Labs    07/25/20 0849 07/26/20 0118 07/27/20 1016  NA 133* 131* 132*  K 4.1 3.6 4.1  CL 103 103 105  CO2 23 20* 18*  GLUCOSE 102* 125* 179*  BUN 7* 7* 9  CREATININE 0.87 0.87 0.87  CALCIUM 8.7* 8.3* 8.3*   LFT Recent Labs    07/27/20 1016  PROT 6.2*  ALBUMIN 2.4*  AST 560*  ALT 413*  ALKPHOS 147*  BILITOT 6.1*   PT/INR Recent Labs    07/26/20 0118  LABPROT 18.0*  INR 1.5*   Hepatitis Panel No results for input(s): HEPBSAG, HCVAB, HEPAIGM, HEPBIGM in the last 72 hours.  CT ABDOMEN W CONTRAST  Result Date: 07/25/2020 CLINICAL DATA:  Jaundice.  Cirrhosis. EXAM: CT ABDOMEN WITH CONTRAST TECHNIQUE: Multidetector CT imaging of the abdomen was performed using the standard protocol following bolus administration of intravenous contrast. CONTRAST:  169mL OMNIPAQUE IOHEXOL 300 MG/ML  SOLN COMPARISON:  01/22/2009 FINDINGS: Lower chest: Unremarkable. Hepatobiliary: Nodular liver contour compatible with the reported clinical history of cirrhosis. Regional heterogeneous enhancement identified in the dome of the right liver. Gallbladder is decompressed. Insert normal bili Pancreas: No focal mass lesion. No dilatation of the main duct. No intraparenchymal cyst. No peripancreatic edema. Spleen: Tiny wedge-shaped area of decreased enhancement identified in the medial spleen (18/3) suggesting small infarct. No substantial splenomegaly. Adrenals/Urinary Tract: No adrenal nodule or mass. Kidneys unremarkable. Stomach/Bowel: Fundal diverticulum noted. Stomach otherwise unremarkable. Duodenum is normally positioned as is the ligament of Treitz. Duodenal diverticulum noted. No small bowel or colonic dilatation within the visualized  abdomen. Vascular/Lymphatic: There is moderate calcified aortic atherosclerosis without aneurysm. Portal vein, superior mesenteric vein, and splenic vein are patent. 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 There is no gastrohepatic or hepatoduodenal ligament lymphadenopathy. No retroperitoneal or mesenteric lymphadenopathy. Other: No intraperitoneal free fluid. Musculoskeletal: No worrisome lytic or sclerotic osseous abnormality. IMPRESSION: 1. Cirrhotic liver morphology with an area of subtle heterogeneous enhancement towards the dome. MRI abdomen with and without contrast recommended to further evaluate. 2. No intra or extrahepatic biliary duct dilatation. 3. No overt findings of portal venous hypertension. Electronically Signed   By: Misty Stanley M.D.   On: 07/25/2020 18:01   MR ABDOMEN W WO CONTRAST  Result Date: 07/26/2020 CLINICAL DATA:  Further evaluation of abnormality seen on recent CT EXAM: MRI ABDOMEN WITHOUT AND WITH CONTRAST TECHNIQUE: Multiplanar multisequence MR imaging of the abdomen was performed both before and after the administration of intravenous contrast. CONTRAST:  8.34mL GADAVIST GADOBUTROL 1 MMOL/ML IV SOLN COMPARISON:  CT July 25, 2020 FINDINGS: Lower chest: No acute abnormality. Hepatobiliary: Nodular hepatic contour compatible with history of cirrhosis. Corresponding with findings on prior CT dome of liver there is a non masslike area of heterogeneous signal intensity which demonstrates some  increased T2 signal with low intrinsic T1 signal which does not demonstrate demonstrate immediate postcontrast enhancement but does demonstrate linear serpiginous band of enhancement on more delayed imaging, most consistent with fibrosis. There is a single arterially hyperenhancing 7 mm focus in the peripheral right lobe of the liver on image 24/14 which demonstrates washout but without definite suspicious features. No additional arterially enhancing hepatic lesions.  Gallbladder is decompressed otherwise unremarkable. No biliary ductal dilation. Pancreas: Intrinsic T1 signal of the pancreatic parenchyma is within normal limits. No pancreatic ductal dilation. No cystic or solid enhancing pancreatic masses. Spleen: Mild splenomegaly. Small which shaped area hypoenhancement along the medial aspect of the spleen likely representing a small infarction. Adrenals/Urinary Tract: No masses identified. No evidence of hydronephrosis. Stomach/Bowel: Gastric diverticulum. Duodenal diverticulum. No pathologic dilation of small bowel. Vascular/Lymphatic: Abdominal aortic aneurysm. The portal, splenic and superior mesenteric veins are patent. Prominent periportal lymph nodes measuring up to 7 mm. Pathologically enlarged abdominal lymph nodes. Other:  No abdominal ascites. Musculoskeletal: No suspicious osseous lesions. IMPRESSION: 1. Heterogeneous signal intensity hepatic dome, corresponding with the finding on prior CT is most consistent with fibrosis. 2. Cirrhotic hepatic morphology with mild splenomegaly suggestive portal hypertension. No ascites. 3. Arterially enhancing 7 mm focus in the peripheral right lobe of the liver which demonstrates washout but without definite suspicious features, consistent with a LI-RADS category 3 hepatic lesion. Electronically Signed   By: Dahlia Bailiff MD   On: 07/26/2020 23:17        Principal Problem:   Symptomatic anemia Active Problems:   Hyponatremia   Benign essential hypertension   Mixed hyperlipidemia   Alcohol abuse   Iron deficiency anemia due to chronic blood loss   Alcoholic hepatitis without ascites   Generalized anxiety disorder   GERD (gastroesophageal reflux disease)   Hypokalemia   Alcoholic cirrhosis of liver without ascites (HCC)   Elevated liver function tests   Coagulopathy (HCC)   Portal hypertensive gastropathy ([place])     LOS: 8 days   Tye Savoy ,NP 07/27/2020, 1:54 PM  GI ATTENDING  Interval history and  data reviewed.  Agree with interval progress note as outlined above.  Liver test trending in the good direction.  MRI shows tiny indeterminate lesion for which I would recommend a repeat MRI of the liver in about 6 months or so this will be under the direction of Dr. Lyndel Safe.  In any event, continue to monitor liver test.  We are not committed to liver biopsy at this point, but it is still possible.  We will continue to follow  Docia Chuck. Geri [place]., M.D. [facility] Division of Gastroenterology

## 2020-07-27 NOTE — Progress Notes (Signed)
Per Samtani MD, patient can go off floor with family.

## 2020-07-27 NOTE — Progress Notes (Signed)
PROGRESS NOTE   Kristopher Ramos  IHK:[id number] DOB: 1955-10-03 DOA: 07/18/2020 PCP: Kristopher Glee., MD  Brief Narrative:  65 year old white male with known history of chronic EtOH 6 x 12 oz/day  HTN Anxiety Reflux Colonoscopy with polypectomy 12/2014 Recent diagnosis right neck herpes zoster Rx antivirals ibuprofen since May  1 month history generalized weakness + lightheadedness with positional change from standing to sitting more SOB  ER work-up 7/1 = hemoglobin 6.6 down from 13 Sodium 119LFTs 522/350Alk phos 166Total bili 2.0  Rx 2 units PRBCABD USG-Cirrhotic liver without masses 7/1 Endoscopy Dr. Lyndel Safe GAVE Rx RFA + APC with polypectomy of 2 gastric polyps Hepatitis serologies   [-]  GI reconsulted 7/5 ordering-- ANA pending , AMA, smooth muscle antibody, ceruloplasmin negative ferritin alpha-1 antitrypsin slightly elevated and herpes simplex DNA ALL neg  Liver function shows slight improvement-GI following  Hospital-Problem based course  Generalized weakness shortness of breath Secondary to subacute anemia-see below--stable GAVE Rx 7/1 RFA + APC and polypectomy gastric polyps x2 Hemoglobin appropriate rise after 2 units PRBC 7/1 Per GI-Protonix 40 twice daily Acute liver injury [?  NSAID [less than 1 % incidence],?  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 withdrawal Mild metabolic acidosis CO2 18, non-gap acidosis Secondary to potomania--drinks 6x12 ounce cans daily-will stop drinking after our discussions during hospital stay Sodium and K close to  nl Recent herpes zoster Gabapentin 100 3 times daily held Appears to have been treated with Valtrex 1 g 3 times daily in the recent past which could have been the precipitant for wrosening liver disease HTN Losartan held from admission Anxiety Continue Celexa 20, hydroxyzine now to10 x3 times daily as needed itching or anxiety Declines use melatonin    DVT prophylaxis: SCD Code Status: Full Family Communication: d/w daughter at the bedside Disposition:  Status is: Inpatient  Remains inpatient appropriate because:Persistent severe electrolyte disturbances, Ongoing active pain requiring inpatient pain management, and IV treatments appropriate due to intensity of illness or inability to take PO  Dispo: The patient is from: Home              Anticipated d/c is to: Home              Patient currently is medically stable to d/c.   Difficult to place patient No     Consultants:  Gastroenterology  Procedures: None currently  Antimicrobials: None   Subjective:  Slept poorly again No cp  - fever-- chills-- nausea-+ vomiting  He can go off unit with daughter    Objective: Vitals:   07/26/20 2038 07/27/20 0014 07/27/20 0417 07/27/20 0755  BP: 112/66 (!) 102/54 (!) 108/54 129/69  Pulse: 73 68 62 61  Resp: 18 15 14 18   Temp: 98.6 F (37 C) 99.2 F (37.3 C) 98.3 F (36.8 C) 98.5 F (36.9 C)  TempSrc: Oral Oral Oral Oral  SpO2: 100% 96% 97% 99%  Weight:      Height:       No intake or output data in the 24 hours ending 07/27/20 1444  Filed Weights   07/19/20 0012  Weight: 89.8 kg    Examination:  anxious but less so Thick neck Mallampati  4 Chest clear Abdomen soft , no HSM No lower extremity edema Neurologically intact moves all 4 limbs equally  Data Reviewed: personally reviewed   AST/ALT 627/430-->560/413 Alk phos 147 Bilirubin 5.1-->6.1, Hemoglobin 9.0 platelet 196 white count normal  Radiology Studies: CT ABDOMEN W CONTRAST  Result Date:  07/25/2020 CLINICAL DATA:  Jaundice.  Cirrhosis. EXAM: CT ABDOMEN WITH CONTRAST TECHNIQUE: Multidetector CT imaging of the abdomen was performed using the standard protocol following bolus administration of intravenous contrast. CONTRAST:  114m OMNIPAQUE IOHEXOL 300 MG/ML  SOLN COMPARISON:  01/22/2009 FINDINGS: Lower chest: Unremarkable. Hepatobiliary: Nodular liver contour compatible with the reported clinical history of cirrhosis. Regional heterogeneous enhancement identified in the dome of the right liver. Gallbladder is decompressed. Insert normal bili Pancreas: No focal mass lesion. No dilatation of the main duct. No intraparenchymal cyst. No peripancreatic edema. Spleen: Tiny wedge-shaped area of decreased enhancement identified in the medial spleen (18/3) suggesting small infarct. No substantial splenomegaly. Adrenals/Urinary Tract: No adrenal nodule or mass. Kidneys unremarkable. Stomach/Bowel: Fundal diverticulum noted. Stomach otherwise unremarkable. Duodenum is normally positioned as is the ligament of Treitz. Duodenal diverticulum noted. No small bowel or colonic dilatation within the visualized abdomen. Vascular/Lymphatic: There is moderate calcified aortic atherosclerosis without aneurysm. Portal vein, superior mesenteric vein, and splenic vein are patent. 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 There is no gastrohepatic or hepatoduodenal ligament lymphadenopathy. No retroperitoneal or mesenteric lymphadenopathy. Other: No intraperitoneal free fluid. Musculoskeletal: No worrisome lytic or sclerotic osseous abnormality. IMPRESSION: 1. Cirrhotic liver morphology with an area of subtle heterogeneous enhancement towards the dome. MRI abdomen with and without contrast recommended to further evaluate. 2. No intra or extrahepatic biliary duct dilatation. 3. No overt findings of portal venous hypertension. Electronically Signed   By: EMisty StanleyM.D.   On: 07/25/2020 18:01    MR ABDOMEN W WO CONTRAST  Result Date: 07/26/2020 CLINICAL DATA:  Further evaluation of abnormality seen on recent CT EXAM: MRI ABDOMEN WITHOUT AND WITH CONTRAST TECHNIQUE: Multiplanar multisequence MR imaging of the abdomen was performed both before and after the administration of intravenous contrast. CONTRAST:  8.923mGADAVIST GADOBUTROL 1 MMOL/ML IV SOLN COMPARISON:  CT July 25, 2020 FINDINGS: Lower chest: No acute abnormality. Hepatobiliary: Nodular hepatic contour compatible with history of cirrhosis. Corresponding with findings on prior CT dome of liver there is a non masslike area of heterogeneous signal intensity which demonstrates some increased T2 signal with low intrinsic T1 signal which does not demonstrate demonstrate immediate postcontrast enhancement but does demonstrate linear serpiginous band of enhancement on more delayed imaging, most consistent with fibrosis. There is a single arterially hyperenhancing 7 mm focus in the peripheral right lobe of the liver on image 24/14 which demonstrates washout but without definite suspicious features. No additional arterially enhancing hepatic lesions. Gallbladder is decompressed otherwise unremarkable. No biliary ductal dilation. Pancreas: Intrinsic T1 signal of the pancreatic parenchyma is within normal limits. No pancreatic ductal dilation. No cystic or solid enhancing pancreatic masses. Spleen: Mild splenomegaly. Small which shaped area hypoenhancement along the medial aspect of the spleen likely representing a small infarction. Adrenals/Urinary Tract: No masses identified. No evidence of hydronephrosis. Stomach/Bowel: Gastric diverticulum. Duodenal diverticulum. No pathologic dilation of small bowel. Vascular/Lymphatic: Abdominal aortic aneurysm. The portal, splenic and superior mesenteric veins are patent. Prominent periportal lymph nodes measuring up to 7 mm. Pathologically enlarged abdominal lymph nodes. Other:  No abdominal ascites.  Musculoskeletal: No suspicious osseous lesions. IMPRESSION: 1. Heterogeneous signal intensity hepatic dome, corresponding with the  finding on prior CT is most consistent with fibrosis. 2. Cirrhotic hepatic morphology with mild splenomegaly suggestive portal hypertension. No ascites. 3. Arterially enhancing 7 mm focus in the peripheral right lobe of the liver which demonstrates washout but without definite suspicious features, consistent with a LI-RADS category 3 hepatic lesion. Electronically Signed   By: Dahlia Bailiff MD   On: 07/26/2020 23:17     Scheduled Meds:  citalopram  20 mg Oral Daily   folic acid  1 mg Oral Daily   melatonin  10 mg Oral QHS   pantoprazole  40 mg Oral BID   polyethylene glycol  17 g Oral Daily   thiamine  100 mg Oral Daily   Or   thiamine  100 mg Intravenous Daily   Continuous Infusions:   LOS: 8 days   Time spent: [place], MD [facility] To contact the attending provider between 7A-7P or the covering provider during after hours 7P-7A, please log into the web site [email or web address] and access using universal [place] password for that web site. If you do not have the password, please call the hospital operator.  07/27/2020, 2:44 PM

## 2020-07-28 DIAGNOSIS — R945 Abnormal results of liver function studies: Secondary | ICD-10-CM

## 2020-07-28 DIAGNOSIS — R7989 Other specified abnormal findings of blood chemistry: Secondary | ICD-10-CM

## 2020-07-28 LAB — COMPREHENSIVE METABOLIC PANEL
ALT: 406 U/L — ABNORMAL HIGH (ref 0–44)
AST: 539 U/L — ABNORMAL HIGH (ref 15–41)
Albumin: 2.6 g/dL — ABNORMAL LOW (ref 3.5–5.0)
Alkaline Phosphatase: 164 U/L — ABNORMAL HIGH (ref 38–126)
Anion gap: 6 (ref 5–15)
BUN: 8 mg/dL (ref 8–23)
CO2: 22 mmol/L (ref 22–32)
Calcium: 8.5 mg/dL — ABNORMAL LOW (ref 8.9–10.3)
Chloride: 105 mmol/L (ref 98–111)
Creatinine, Ser: 0.86 mg/dL (ref 0.61–1.24)
GFR, Estimated: >60 mL/min (ref >60)
Glucose, Bld: 115 mg/dL — ABNORMAL HIGH (ref 70–99)
Potassium: 3.6 mmol/L (ref 3.5–5.1)
Sodium: 133 mmol/L — ABNORMAL LOW (ref 135–145)
Total Bilirubin: 6.3 mg/dL — ABNORMAL HIGH (ref 0.3–1.2)
Total Protein: 6.8 g/dL (ref 6.5–8.1)

## 2020-07-28 LAB — PROTIME-INR
INR: 1.4 — ABNORMAL HIGH (ref 0.8–1.2)
Prothrombin Time: 17.3 seconds — ABNORMAL HIGH (ref 11.4–15.2)

## 2020-07-28 NOTE — Progress Notes (Signed)
PROGRESS NOTE   Kristopher Ramos  FFM:[id number] DOB: [birth year] DOA: 07/18/2020 PCP: Kristopher Glee., MD  Brief Narrative:  65 year old white male with known history of chronic EtOH 6 x 12 oz/day  HTN Anxiety Reflux Colonoscopy with polypectomy 12/2014 Recent diagnosis right neck herpes zoster Rx antivirals ibuprofen since May  1 month history generalized weakness + lightheadedness with positional change from standing to sitting more SOB  ER work-up 7/1 = hemoglobin 6.6 down from 13 Sodium 119LFTs 522/350Alk phos 166Total bili 2.0  Rx 2 units PRBCABD USG-Cirrhotic liver without masses 7/1 Endoscopy Dr. 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  NSAID [less than 1 % incidence],?  Valtrex [less than 2 % incidence] ]superimposed on chronic likely cirrhosis MELD 3.0=14 when last calculated on 7/7 GI reconsulted 7/5-MRI 07/26/20 subtle 19m Li-rads 3 lesion--long discussion- -?  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  Precipitant liver disease HTN Losartan held from admission Anxiety Continue Celexa 20, hydroxyzine now to10 x3 times daily as needed itching or anxiety Declines use melatonin    DVT prophylaxis: SCD Code Status: Full Family Communication: d/w patient's daughter and son at the bedside Filled out FMLA forms for the patient Disposition:  Status is: Inpatient  Remains inpatient appropriate because:Persistent severe electrolyte disturbances, Ongoing active pain requiring inpatient pain management, and IV treatments appropriate due to intensity of illness or inability to take PO  Dispo: The patient is from: Home              Anticipated d/c is to: Home              Patient currently is medically stable to d/c.   Difficult to place patient No     Consultants:  Gastroenterology  Procedures: None currently  Antimicrobials: None   Subjective:   good sleep last night no new issues passing stool Otherwise seems well  Objective: Vitals:   07/28/20 0450 07/28/20 0734 07/28/20 0819 07/28/20 1159  BP: 121/69 126/70 120/69 (!) 144/75  Pulse: 62 71 74 72  Resp:  _0 Temp: 98.3 F (36.8 C) 98.1 F (36.7 C) 97.9 F (36.6 C) 97.7 F (36.5 C)  TempSrc: Oral Oral Oral Oral  SpO2: 98% 100% 100% 100%  Weight:      Height:        Intake/Output Summary (Last 24 hours) at 07/28/2020 1523 Last data filed at 07/27/2020 2027 Gross per 24 hour  Intake 120 ml  Output --  Net 120 ml    FAutoliv  07/19/20 0012  Weight: 89.8 kg    Examination:  Somewhat anxious thick neck chest clear no rales no rhonchi no supraclavicular adenopathy neck soft supple no lymphadenopathy no thyromegaly chest clear no added sound abdomen soft no rebound no guarding cannot appreciate HSM ROM intact moving 4 limbs equally no icterus no pallor  Data Reviewed: personally reviewed   AST/ALT  627/430-->560/413-->539/406 Alk phos 147-->164 Bilirubin 5.1-->6.3   Radiology Studies: MR ABDOMEN W WO CONTRAST  Result Date: 07/26/2020 CLINICAL DATA:  Further evaluation of abnormality seen on recent CT EXAM: MRI ABDOMEN WITHOUT AND WITH CONTRAST TECHNIQUE: Multiplanar multisequence MR imaging of the abdomen was performed both before and after the administration of intravenous contrast. CONTRAST:  8.20m GADAVIST GADOBUTROL 1 MMOL/ML IV SOLN COMPARISON:  CT July 25, 2020 FINDINGS: Lower chest: No acute abnormality. Hepatobiliary: Nodular hepatic contour compatible with history of cirrhosis. Corresponding with findings on prior CT dome of liver there is a non masslike area of heterogeneous signal intensity which demonstrates some increased T2 signal with low intrinsic T1 signal which does not demonstrate demonstrate immediate postcontrast enhancement but does demonstrate linear serpiginous band of enhancement on more delayed imaging, most consistent with fibrosis. There is a single arterially hyperenhancing 7 mm focus in the peripheral right lobe of the liver on image 24/14 which demonstrates washout but without definite suspicious features. No additional arterially enhancing hepatic lesions. Gallbladder is decompressed otherwise unremarkable. No biliary ductal dilation. Pancreas: Intrinsic T1 signal of the pancreatic parenchyma is within normal limits. No pancreatic ductal dilation. No cystic or solid enhancing pancreatic masses. Spleen: Mild splenomegaly. Small which shaped area hypoenhancement along the medial aspect of the spleen likely representing a small infarction. Adrenals/Urinary Tract: No masses identified. No evidence of hydronephrosis. Stomach/Bowel: Gastric diverticulum. Duodenal diverticulum. No pathologic dilation of small bowel. Vascular/Lymphatic: Abdominal aortic aneurysm. The portal, splenic and superior mesenteric veins are patent. Prominent periportal lymph nodes measuring up to 7 mm.  Pathologically enlarged abdominal lymph nodes. Other:  No abdominal ascites. Musculoskeletal: No suspicious osseous lesions. IMPRESSION: 1. Heterogeneous signal intensity hepatic dome, corresponding with the finding on prior CT is most consistent with fibrosis. 2. Cirrhotic hepatic morphology with mild splenomegaly suggestive portal hypertension. No ascites. 3. Arterially enhancing 7 mm focus in the peripheral right lobe of the liver which demonstrates washout but without definite suspicious features, consistent with a LI-RADS category 3 hepatic lesion. Electronically Signed   By: JDahlia BailiffMD   On: 07/26/2020 23:17     Scheduled Meds:  citalopram  20 mg Oral Daily   folic acid  1 mg Oral Daily   hydrOXYzine  10 mg Oral TID   melatonin  10 mg Oral QHS   pantoprazole  40 mg Oral BID   polyethylene glycol  17 g Oral Daily   thiamine  100 mg Oral Daily   Or   thiamine  100 mg Intravenous Daily   Continuous Infusions:   LOS: 9 days   Time spent: 24  JNita Sells MD [facility] To contact the attending provider between 7A-7P or the covering provider during after hours 7P-7A, please log into the web site [email or web address] and access using universal [place] password for that web site. If you do not have the password, please call the hospital operator.  07/28/2020, 3:23 PM

## 2020-07-28 NOTE — Progress Notes (Signed)
HISTORY OF PRESENT ILLNESS:  Kristopher Ramos is a 65 y.o. male admitted with malaise, iron deficiency anemia, and elevated liver test.  Found to have underlying cirrhosis felt secondary to alcohol.  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  He had colonoscopy elsewhere 2016 with small adenoma.  Elevated liver tests were attributed to alcoholic hepatitis.  Our team signed off.  We were call back.  Liver test abnormalities have persisted.  Work-up for other etiologies negative.  ANA pending.  Patient has been walking today.  Feels better.  His son and daughter are in the room.  No new complaints  REVIEW OF SYSTEMS:  All non-GI ROS negative unless otherwise stated  Past Medical History:  Diagnosis Date   Alcohol abuse 07/19/2020   Benign essential hypertension 01/08/2015   Generalized anxiety disorder 07/19/2020   GERD (gastroesophageal reflux disease) 07/19/2020   Mixed hyperlipidemia 09/21/2017    Past Surgical History:  Procedure Laterality Date   BIOPSY  07/19/2020   Procedure: BIOPSY;  Surgeon: Jackquline Denmark, MD;  Location: [facility] ENDOSCOPY;  Service: Endoscopy;;   ESOPHAGOGASTRODUODENOSCOPY (EGD) WITH PROPOFOL N/A 07/19/2020   Procedure: ESOPHAGOGASTRODUODENOSCOPY (EGD) WITH PROPOFOL;  Surgeon: Jackquline Denmark, MD;  Location: [facility] ENDOSCOPY;  Service: Endoscopy;  Laterality: N/A;   GI RADIOFREQUENCY ABLATION  07/19/2020   Procedure: GI RADIOFREQUENCY ABLATION;  Surgeon: Jackquline Denmark, MD;  Location: [facility] ENDOSCOPY;  Service: Endoscopy;;   POLYPECTOMY  07/19/2020   Procedure: POLYPECTOMY;  Surgeon: Jackquline Denmark, MD;  Location: [facility] ENDOSCOPY;  Service: Endoscopy;;    Social History Loren Racer  reports that he has never smoked. His smokeless tobacco use includes snuff. No history on file for alcohol use and drug use.  family history is not on file.  No Known Allergies  LABORATORIES: Hemoglobin 9.0 Platelets 196,000 INR 1.4 Liver tests: 539/406/164/6.3      PHYSICAL  EXAMINATION: Vital signs: BP (!) 144/75 (BP Location: Right Arm) Comment: RN notified  Pulse 72   Temp 97.7 F (36.5 C) (Oral)   Resp 18   Ht 5\' 6"  (1.676 m)   Wt 89.8 kg   SpO2 100%   BMI 31.96 kg/m   Constitutional: generally well-appearing, no acute distress Psychiatric: alert and oriented x3, cooperative Eyes: Icterus Abdomen: Not reexamined Extremities: No edema Skin: Slight jaundice but no lesions on visible extremities   ASSESSMENT:  1.  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 2.  Markedly elevated, persistently, liver tests.  Felt likely secondary insult on top of underlying liver disease.  As discussed previously, possible etiologies include zoster infection, Valtrex, NSAIDs/analgesics used for herpetic pain.  Cannot rule out medication induced autoimmune liver picture.  INR 1.4.  Platelets normal.  Bilirubin 6.3 3.  History of hepatic adenoma 2016 4.  Alcohol abuse.  Highly functioning individual. 5.  Small indeterminate liver lesion.  Would reimage in 6 months  PLAN:  1.  Plan for radiologic guided biopsy of the liver tomorrow. 2.  Iron replacement 3.  Stop using alcohol 4.  He will eventually follow-up outpatient with Dr. Lyndel Safe.  He will need colonoscopy 5.  GI will continue to follow.  Dr. Carlean Purl will be the GI hospital attending this week. Discussed with patient and his children.  Multiple questions answered to their satisfaction.  Docia Chuck. Geri Seminole., M.D. [facility] Division of Gastroenterology

## 2020-07-29 LAB — COMPREHENSIVE METABOLIC PANEL
ALT: 309 U/L — ABNORMAL HIGH (ref 0–44)
AST: 415 U/L — ABNORMAL HIGH (ref 15–41)
Albumin: 2.3 g/dL — ABNORMAL LOW (ref 3.5–5.0)
Alkaline Phosphatase: 144 U/L — ABNORMAL HIGH (ref 38–126)
Anion gap: 5 (ref 5–15)
BUN: 6 mg/dL — ABNORMAL LOW (ref 8–23)
CO2: 22 mmol/L (ref 22–32)
Calcium: 8.2 mg/dL — ABNORMAL LOW (ref 8.9–10.3)
Chloride: 107 mmol/L (ref 98–111)
Creatinine, Ser: 0.87 mg/dL (ref 0.61–1.24)
GFR, Estimated: >60 mL/min (ref >60)
Glucose, Bld: 125 mg/dL — ABNORMAL HIGH (ref 70–99)
Potassium: 3.7 mmol/L (ref 3.5–5.1)
Sodium: 134 mmol/L — ABNORMAL LOW (ref 135–145)
Total Bilirubin: 4.9 mg/dL — ABNORMAL HIGH (ref 0.3–1.2)
Total Protein: 5.9 g/dL — ABNORMAL LOW (ref 6.5–8.1)

## 2020-07-29 LAB — CBC WITH DIFFERENTIAL/PLATELET
Abs Immature Granulocytes: 0.02 10*3/uL (ref 0.00–0.07)
Basophils Absolute: 0.1 10*3/uL (ref 0.0–0.1)
Basophils Relative: 1 %
Eosinophils Absolute: 0.3 10*3/uL (ref 0.0–0.5)
Eosinophils Relative: 5 %
HCT: 26.8 % — ABNORMAL LOW (ref 39.0–52.0)
Hemoglobin: 8.7 g/dL — ABNORMAL LOW (ref 13.0–17.0)
Immature Granulocytes: 0 %
Lymphocytes Relative: 22 %
Lymphs Abs: 1.4 10*3/uL (ref 0.7–4.0)
MCH: 24 pg — ABNORMAL LOW (ref 26.0–34.0)
MCHC: 32.5 g/dL (ref 30.0–36.0)
MCV: 74 fL — ABNORMAL LOW (ref 80.0–100.0)
Monocytes Absolute: 1.1 10*3/uL — ABNORMAL HIGH (ref 0.1–1.0)
Monocytes Relative: 18 %
Neutro Abs: 3.4 10*3/uL (ref 1.7–7.7)
Neutrophils Relative %: 54 %
Platelets: 201 10*3/uL (ref 150–400)
RBC: 3.62 MIL/uL — ABNORMAL LOW (ref 4.22–5.81)
RDW: 27.3 % — ABNORMAL HIGH (ref 11.5–15.5)
WBC: 6.2 10*3/uL (ref 4.0–10.5)
nRBC: 0 % (ref 0.0–0.2)

## 2020-07-29 LAB — PROTIME-INR
INR: 1.5 — ABNORMAL HIGH (ref 0.8–1.2)
Prothrombin Time: 17.8 seconds — ABNORMAL HIGH (ref 11.4–15.2)

## 2020-07-29 MED ORDER — OXYCODONE HCL 5 MG PO TABS: 5.0000 mg | ORAL_TABLET | ORAL | Status: DC | PRN

## 2020-07-29 NOTE — Consult Note (Signed)
Chief Complaint: Patient was seen in consultation today for hepatic cirrhosis  Referring Physician(s): Dr. Carlean Purl  Supervising Physician: Corrie Mckusick  Patient Status: [facility] In-pt  History of Present Illness: Kristopher Ramos is a 65 y.o. male with past medical history of BPH, GERD, GAD, alcohol abuse with baseline hepatic cirrhosis/persistent transaminases however admitted with worsening malaise and ongoing elevated LFTs.  IR consulted for liver biopsy for ongoing diagnostic work-up.    Patient assessed at bedside.  There is evidence of mild jaundice.  He is aware of procedure and goals.  He is agreeable to proceed.   Past Medical History:  Diagnosis Date   Alcohol abuse 07/19/2020   Benign essential hypertension 01/08/2015   Generalized anxiety disorder 07/19/2020   GERD (gastroesophageal reflux disease) 07/19/2020   Mixed hyperlipidemia 09/21/2017    Past Surgical History:  Procedure Laterality Date   BIOPSY  07/19/2020   Procedure: BIOPSY;  Surgeon: Jackquline Denmark, MD;  Location: [place];  Service: Endoscopy;;   ESOPHAGOGASTRODUODENOSCOPY (EGD) WITH PROPOFOL N/A 07/19/2020   Procedure: ESOPHAGOGASTRODUODENOSCOPY (EGD) WITH PROPOFOL;  Surgeon: Jackquline Denmark, MD;  Location: [facility] ENDOSCOPY;  Service: Endoscopy;  Laterality: N/A;   GI RADIOFREQUENCY ABLATION  07/19/2020   Procedure: GI RADIOFREQUENCY ABLATION;  Surgeon: Jackquline Denmark, MD;  Location: [facility] ENDOSCOPY;  Service: Endoscopy;;   POLYPECTOMY  07/19/2020   Procedure: POLYPECTOMY;  Surgeon: Jackquline Denmark, MD;  Location: [facility] ENDOSCOPY;  Service: Endoscopy;;    Allergies: Patient has no known allergies.  Medications: Prior to Admission medications   Medication Sig Start Date End Date Taking? Authorizing Provider  atorvastatin (LIPITOR) 20 MG tablet Take 20 mg by mouth daily. 04/24/20  Yes [provider]  citalopram (CELEXA) 20 MG tablet Take 20 mg by mouth daily.   Yes [provider]  EPINEPHrine 0.3  mg/0.3 mL IJ SOAJ injection Inject 0.3 mg into the muscle as needed for anaphylaxis. 11/15/17  Yes [provider]  ibuprofen (ADVIL) 200 MG tablet Take 400 mg by mouth every 6 (six) hours as needed for headache or moderate pain.   Yes [provider]  pantoprazole (PROTONIX) 40 MG tablet Take 40 mg by mouth daily. 06/21/20  Yes [provider]  amLODipine (NORVASC) 10 MG tablet Take 10 mg by mouth daily. Patient not taking: Reported on 07/19/2020 05/01/20   [provider]  gabapentin (NEURONTIN) 100 MG capsule Take 100 mg by mouth 3 (three) times daily. Patient not taking: Reported on 07/19/2020 07/01/20   [provider]  losartan (COZAAR) 100 MG tablet Take 100 mg by mouth daily. Patient not taking: Reported on 07/19/2020 06/21/20   [provider]  valACYclovir (VALTREX) 1000 MG tablet Take 1,000 mg by mouth See admin instructions. Tid x 7 days Patient not taking: Reported on 07/19/2020 06/05/20   [provider]     Family History  Problem Relation Age of Onset   Stomach cancer Neg Hx     Social History   Socioeconomic History   Marital status: Single    Spouse name: Not on file   Number of children: Not on file   Years of education: Not on file   Highest education level: Not on file  Occupational History   Not on file  Tobacco Use   Smoking status: Never   Smokeless tobacco: Current    Types: Snuff  Substance and Sexual Activity   Alcohol use: Not on file   Drug use: Not on file   Sexual activity: Not on  file  Other Topics Concern   Not on file  Social History Narrative   Not on file   Social Determinants of Health   Financial Resource Strain: Not on file  Food Insecurity: Not on file  Transportation Needs: Not on file  Physical Activity: Not on file  Stress: Not on file  Social Connections: Not on file     Review of Systems: A 12 point ROS discussed and pertinent positives are indicated in the HPI above.  All  other systems are negative.  Review of Systems  Constitutional:  Positive for fatigue. Negative for fever.  Respiratory:  Negative for cough and shortness of breath.   Cardiovascular:  Negative for chest pain.  Gastrointestinal:  Negative for abdominal pain and nausea.  Musculoskeletal:  Negative for back pain.  Psychiatric/Behavioral:  Negative for behavioral problems and confusion.    Vital Signs: BP (!) 142/71 (BP Location: Right Arm)   Pulse 75   Temp 98 F (36.7 C) (Oral)   Resp 18   Ht 5\' 6"  (1.676 m)   Wt 198 lb (89.8 kg)   SpO2 100%   BMI 31.96 kg/m   Physical Exam Vitals and nursing note reviewed.  Constitutional:      General: He is not in acute distress.    Appearance: Normal appearance. He is not ill-appearing.  HENT:     Mouth/Throat:     Mouth: Mucous membranes are moist.     Pharynx: Oropharynx is clear.  Cardiovascular:     Rate and Rhythm: Normal rate and regular rhythm.  Pulmonary:     Effort: Pulmonary effort is normal.  Abdominal:     General: Abdomen is flat.     Palpations: Abdomen is soft.  Skin:    General: Skin is warm and dry.  Neurological:     General: No focal deficit present.     Mental Status: He is alert and oriented to person, place, and time. Mental status is at baseline.  Psychiatric:        Mood and Affect: Mood normal.        Behavior: Behavior normal.        Thought Content: Thought content normal.        Judgment: Judgment normal.     MD Evaluation Airway: WNL Heart: WNL Abdomen: WNL Chest/ Lungs: WNL ASA  Classification: 3 Mallampati/Airway Score: Two   Imaging: CT ABDOMEN W CONTRAST  Result Date: 07/25/2020 CLINICAL DATA:  Jaundice.  Cirrhosis. EXAM: CT ABDOMEN WITH CONTRAST TECHNIQUE: Multidetector CT imaging of the abdomen was performed using the standard protocol following bolus administration of intravenous contrast. CONTRAST:  178mL OMNIPAQUE IOHEXOL 300 MG/ML  SOLN COMPARISON:  01/22/2009 FINDINGS: Lower  chest: Unremarkable. Hepatobiliary: Nodular liver contour compatible with the reported clinical history of cirrhosis. Regional heterogeneous enhancement identified in the dome of the right liver. Gallbladder is decompressed. Insert normal bili Pancreas: No focal mass lesion. No dilatation of the main duct. No intraparenchymal cyst. No peripancreatic edema. Spleen: Tiny wedge-shaped area of decreased enhancement identified in the medial spleen (18/3) suggesting small infarct. No substantial splenomegaly. Adrenals/Urinary Tract: No adrenal nodule or mass. Kidneys unremarkable. Stomach/Bowel: Fundal diverticulum noted. Stomach otherwise unremarkable. Duodenum is normally positioned as is the ligament of Treitz. Duodenal diverticulum noted. No small bowel or colonic dilatation within the visualized abdomen. Vascular/Lymphatic: There is moderate calcified aortic atherosclerosis without aneurysm. Portal vein, superior mesenteric vein, and splenic vein are patent. 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  There is no gastrohepatic or hepatoduodenal ligament lymphadenopathy. No retroperitoneal or mesenteric lymphadenopathy. Other: No intraperitoneal free fluid. Musculoskeletal: No worrisome lytic or sclerotic osseous abnormality. IMPRESSION: 1. Cirrhotic liver morphology with an area of subtle heterogeneous enhancement towards the dome. MRI abdomen with and without contrast recommended to further evaluate. 2. No intra or extrahepatic biliary duct dilatation. 3. No overt findings of portal venous hypertension. Electronically Signed   By: Misty Stanley M.D.   On: 07/25/2020 18:01   MR ABDOMEN W WO CONTRAST  Result Date: 07/26/2020 CLINICAL DATA:  Further evaluation of abnormality seen on recent CT EXAM: MRI ABDOMEN WITHOUT AND WITH CONTRAST TECHNIQUE: Multiplanar multisequence MR imaging of the abdomen was performed both before and after the administration of intravenous contrast. CONTRAST:   8.34mL GADAVIST GADOBUTROL 1 MMOL/ML IV SOLN COMPARISON:  CT July 25, 2020 FINDINGS: Lower chest: No acute abnormality. Hepatobiliary: Nodular hepatic contour compatible with history of cirrhosis. Corresponding with findings on prior CT dome of liver there is a non masslike area of heterogeneous signal intensity which demonstrates some increased T2 signal with low intrinsic T1 signal which does not demonstrate demonstrate immediate postcontrast enhancement but does demonstrate linear serpiginous band of enhancement on more delayed imaging, most consistent with fibrosis. There is a single arterially hyperenhancing 7 mm focus in the peripheral right lobe of the liver on image 24/14 which demonstrates washout but without definite suspicious features. No additional arterially enhancing hepatic lesions. Gallbladder is decompressed otherwise unremarkable. No biliary ductal dilation. Pancreas: Intrinsic T1 signal of the pancreatic parenchyma is within normal limits. No pancreatic ductal dilation. No cystic or solid enhancing pancreatic masses. Spleen: Mild splenomegaly. Small which shaped area hypoenhancement along the medial aspect of the spleen likely representing a small infarction. Adrenals/Urinary Tract: No masses identified. No evidence of hydronephrosis. Stomach/Bowel: Gastric diverticulum. Duodenal diverticulum. No pathologic dilation of small bowel. Vascular/Lymphatic: Abdominal aortic aneurysm. The portal, splenic and superior mesenteric veins are patent. Prominent periportal lymph nodes measuring up to 7 mm. Pathologically enlarged abdominal lymph nodes. Other:  No abdominal ascites. Musculoskeletal: No suspicious osseous lesions. IMPRESSION: 1. Heterogeneous signal intensity hepatic dome, corresponding with the finding on prior CT is most consistent with fibrosis. 2. Cirrhotic hepatic morphology with mild splenomegaly suggestive portal hypertension. No ascites. 3. Arterially enhancing 7 mm focus in the peripheral  right lobe of the liver which demonstrates washout but without definite suspicious features, consistent with a LI-RADS category 3 hepatic lesion. Electronically Signed   By: Dahlia Bailiff MD   On: 07/26/2020 23:17   US Abdomen Limited RUQ (LIVER/GB)  Result Date: 07/19/2020 CLINICAL DATA:  Cirrhosis EXAM: ULTRASOUND ABDOMEN LIMITED RIGHT UPPER QUADRANT COMPARISON:  None. FINDINGS: Gallbladder: No gallstones or wall thickening visualized. No sonographic Murphy sign noted by sonographer. Common bile duct: Diameter: 2 mm in proximal diameter Liver: The liver contour is nodular in keeping with changes of cirrhosis. No focal intrahepatic masses identified. No intrahepatic biliary ductal dilation. Liver size is within normal limits. Portal vein is patent on color Doppler imaging with normal direction of blood flow towards the liver. Other: No ascites IMPRESSION: Cirrhotic change of the liver. No focal intrahepatic mass identified. Electronically Signed   By: Fidela Salisbury MD   On: 07/19/2020 04:46    Labs:  CBC: Recent Labs    07/24/20 0059 07/26/20 0118 07/27/20 1016 07/29/20 0126  WBC 7.1 6.5 6.3 6.2  HGB 9.1* 8.5* 9.0* 8.7*  HCT 28.6* 27.4* 30.1* 26.8*  PLT 200 189 196 201  COAGS: Recent Labs    07/19/20 0354 07/20/20 0254 07/24/20 0747 07/26/20 0118 07/28/20 1014 07/29/20 0126  INR 1.4*   < > 1.4* 1.5* 1.4* 1.5*  APTT 34  --   --   --   --   --    < > = values in this interval not displayed.    BMP: Recent Labs    07/26/20 0118 07/27/20 1016 07/28/20 0823 07/29/20 0126  NA 131* 132* 133* 134*  K 3.6 4.1 3.6 3.7  CL 103 105 105 107  CO2 20* 18* 22 22  GLUCOSE 125* 179* 115* 125*  BUN 7* 9 8 6*  CALCIUM 8.3* 8.3* 8.5* 8.2*  CREATININE 0.87 0.87 0.86 0.87  GFRNONAA >60 >60 >60 >60    LIVER FUNCTION TESTS: Recent Labs    07/26/20 0118 07/27/20 1016 07/28/20 0823 07/29/20 0126  BILITOT 5.1* 6.1* 6.3* 4.9*  AST 627* 560* 539* 415*  ALT 430* 413* 406* 309*   ALKPHOS 135* 147* 164* 144*  PROT 6.0* 6.2* 6.8 5.9*  ALBUMIN 2.5* 2.4* 2.6* 2.3*    TUMOR MARKERS: No results for input(s): AFPTM, CEA, CA199, CHROMGRNA in the last 8760 hours.  Assessment and Plan: Elevated liver enzymes Patient with ongoing transaminases, hyperbilirubinemia in the setting of known hepatic cirrhosis.  Acute worsening being investigated.  IR consulted for liver biopsy at the request of Dr. Carlean Purl.  Will work towards procedure as schedule allows.  NPO p MN. INR 1.5 Discussed with patient at bedside who is agreeable.   Risks and benefits of liver biopsy was discussed with the patient and/or patient's family including, but not limited to bleeding, infection, damage to adjacent structures or low yield requiring additional tests.  All of the questions were answered and there is agreement to proceed.  Consent signed and in chart.  Thank you for this interesting consult.  I greatly enjoyed meeting Jarquis Walker and look forward to participating in their care.  A copy of this report was sent to the requesting provider on this date.  Electronically Signed: Docia Barrier, PA 07/29/2020, 3:04 PM   I spent a total of 40 Minutes    in face to face in clinical consultation, greater than 50% of which was counseling/coordinating care for elevated LFTs, cirrhosis.

## 2020-07-29 NOTE — Progress Notes (Signed)
PROGRESS NOTE   Kristopher Ramos  PFY:[id number] DOB: 02/25/1955 DOA: 07/18/2020 PCP: Kristopher Glee., MD  Brief Narrative:  65 year old white male with known history of chronic EtOH 6 x 12 oz/day  HTN Anxiety Reflux Colonoscopy with polypectomy 12/2014 Recent diagnosis right neck herpes zoster Rx antivirals ibuprofen since May  1 month history generalized weakness + lightheadedness with positional change from standing to sitting more SOB  ER work-up 7/1 = hemoglobin 6.6 down from 13 Sodium 119LFTs 522/350Alk phos 166Total bili 2.0  Rx 2 units PRBCABD USG-Cirrhotic liver without masses 7/1 Endoscopy Dr. 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  NSAID [less than 1 % incidence],?  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  Precipitant liver disease HTN Losartan held from admission Anxiety Continue Celexa 20, hydroxyzine now to10 x3 times daily as needed itching or anxiety Declines use melatonin    DVT prophylaxis: SCD Code Status: Full Family Communication:  Disposition:  Status is: Inpatient  Remains inpatient appropriate because:Persistent severe electrolyte disturbances, Ongoing active pain requiring inpatient pain management, and IV treatments appropriate due to intensity of illness or inability to take PO  Dispo: The patient is from: Home              Anticipated d/c is to: Home              Patient currently is medically stable to d/c.   Difficult to place patient No     Consultants:  Gastroenterology  Procedures: None currently  Antimicrobials: None   Subjective:  Patient seen walking the hallway doing well awaiting biopsy   Objective: Vitals:   07/28/20 2003 07/29/20 0059 07/29/20 0405 07/29/20 0809  BP: 124/64 115/71 (!) 120/57 120/66  Pulse: 72 64 62 66  Resp: _0 Temp: 99.3 F (37.4 C) 98.7 F (37.1 C) 98.8 F (37.1 C) 98.3 F (36.8 C)  TempSrc: Oral Oral Oral Oral  SpO2: 99% 97% 100% 99%  Weight:      Height:        Intake/Output Summary (Last 24 hours) at 07/29/2020 0816 Last data filed at 07/28/2020 1806 Gross per 24 hour  Intake 600 ml  Output --  Net 600 ml    Filed Weights   07/19/20 0012  Weight: 89.8 kg    Examination:  Pleasant walking around no distress Chest clear no added sound  Data Reviewed: personally reviewed   AST/ALT 627/430-->560/413-->539/406-->415/309 Alk phos 147-->164-->144 Bilirubin 5.1-->6.3-->4.9   Radiology Studies:  No results found.   Scheduled Meds:  citalopram  20 mg Oral Daily   folic acid  1 mg Oral Daily   hydrOXYzine  10 mg Oral TID   melatonin  10 mg Oral QHS   pantoprazole  40 mg Oral BID   polyethylene glycol  17 g Oral Daily   thiamine  100 mg Oral Daily   Or   thiamine  100 mg  Intravenous Daily   Continuous Infusions:   LOS: 10 days   No charge today  Nita Sells, MD [facility] To contact the attending provider between 7A-7P or the covering provider during after hours 7P-7A, please log into the web site [email or web address] and access using universal [place] password for that web site. If you do not have the password, please call the hospital operator.  07/29/2020, 8:16 AM

## 2020-07-29 NOTE — Progress Notes (Signed)
   Patient Name: Kristopher Ramos Date of Encounter: 07/29/2020, 10:59 AM    Subjective  Patient feeling better, has new diagnosis of cirrhosis thought due to alcohol but abnormally high transaminases of unclear etiology question other insult liver biopsy ordered but radiology says unable to do today   Objective  BP 120/66 (BP Location: Left Arm)   Pulse 66   Temp 98.3 F (36.8 C) (Oral)   Resp 18   Ht 5\' 6"  (1.676 m)   Wt 89.8 kg   SpO2 99%   BMI 31.96 kg/m  Recent Labs  Lab 07/24/20 0747 07/25/20 0849 07/26/20 0118 07/27/20 1016 07/28/20 0823 07/28/20 1014 07/29/20 0126  AST  --  765* 627* 560* 539*  --  415*  ALT  --  502* 430* 413* 406*  --  309*  ALKPHOS  --  147* 135* 147* 164*  --  144*  BILITOT  --  6.2* 5.1* 6.1* 6.3*  --  4.9*  PROT  --  6.7 6.0* 6.2* 6.8  --  5.9*  ALBUMIN  --  2.8* 2.5* 2.4* 2.6*  --  2.3*  INR 1.4*  --  1.5*  --   --  1.4* 1.5*       Assessment and Plan   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  Biopsy appropriate.  History of hepatic adenoma 2016  Small indeterminate liver lesion  GAVE and iron deficiency anemia   Plan is to continue with liver biopsy tomorrow if it is unable to be done today.  Given the clinical scenario I do not think outpatient biopsy and delay in obtaining this information is medically appropriate.  Discussed with the patient and communicated to radiology through secure chat.   Gatha Mayer, MD, [facility] 07/29/2020 10:59 AM

## 2020-07-30 ENCOUNTER — Other Ambulatory Visit: Payer: Self-pay | Admitting: Internal Medicine

## 2020-07-30 ENCOUNTER — Telehealth: Payer: Self-pay

## 2020-07-30 ENCOUNTER — Encounter: Payer: Self-pay | Admitting: Family Medicine

## 2020-07-30 ENCOUNTER — Encounter (HOSPITAL_COMMUNITY): Payer: Self-pay

## 2020-07-30 ENCOUNTER — Inpatient Hospital Stay (HOSPITAL_COMMUNITY): Payer: 59

## 2020-07-30 DIAGNOSIS — K7030 Alcoholic cirrhosis of liver without ascites: Secondary | ICD-10-CM

## 2020-07-30 LAB — COMPREHENSIVE METABOLIC PANEL
ALT: 268 U/L — ABNORMAL HIGH (ref 0–44)
AST: 331 U/L — ABNORMAL HIGH (ref 15–41)
Albumin: 2.3 g/dL — ABNORMAL LOW (ref 3.5–5.0)
Alkaline Phosphatase: 134 U/L — ABNORMAL HIGH (ref 38–126)
Anion gap: 8 (ref 5–15)
BUN: 6 mg/dL — ABNORMAL LOW (ref 8–23)
CO2: 22 mmol/L (ref 22–32)
Calcium: 8.2 mg/dL — ABNORMAL LOW (ref 8.9–10.3)
Chloride: 105 mmol/L (ref 98–111)
Creatinine, Ser: 0.89 mg/dL (ref 0.61–1.24)
GFR, Estimated: >60 mL/min (ref >60)
Glucose, Bld: 137 mg/dL — ABNORMAL HIGH (ref 70–99)
Potassium: 3.5 mmol/L (ref 3.5–5.1)
Sodium: 135 mmol/L (ref 135–145)
Total Bilirubin: 4.9 mg/dL — ABNORMAL HIGH (ref 0.3–1.2)
Total Protein: 6 g/dL — ABNORMAL LOW (ref 6.5–8.1)

## 2020-07-30 IMAGING — US US BIOPSY CORE LIVER
1 series · 10 of 10 positions shown · non-contrast
Comparison: none

INDICATION: 64-year-old male with a history medical liver disease referred for
biopsy

[Series 1: us biopsy (liver) · 10 of 10 slices shown]
[im 1/10]
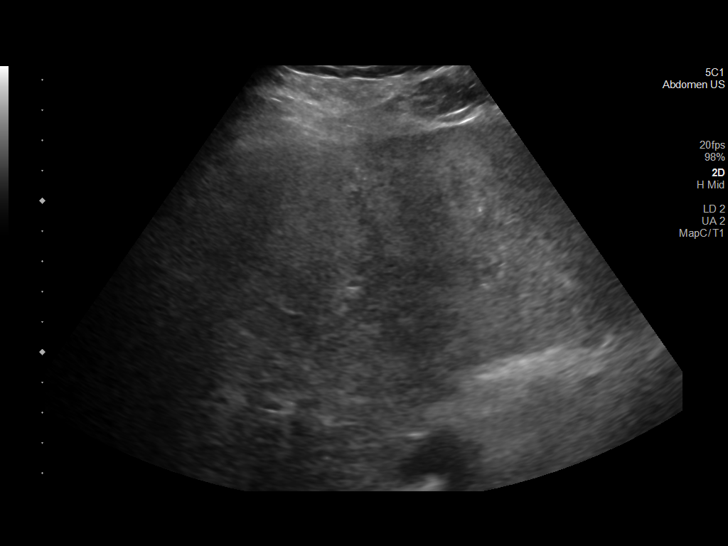
[im 2/10]
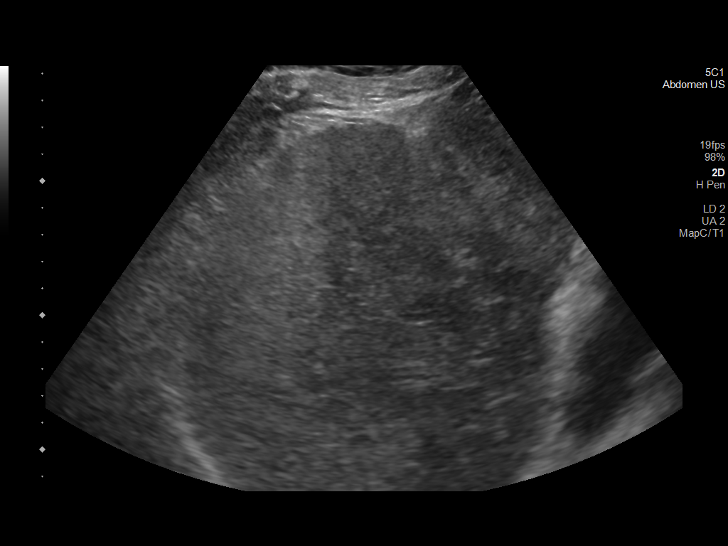
[im 3/10]
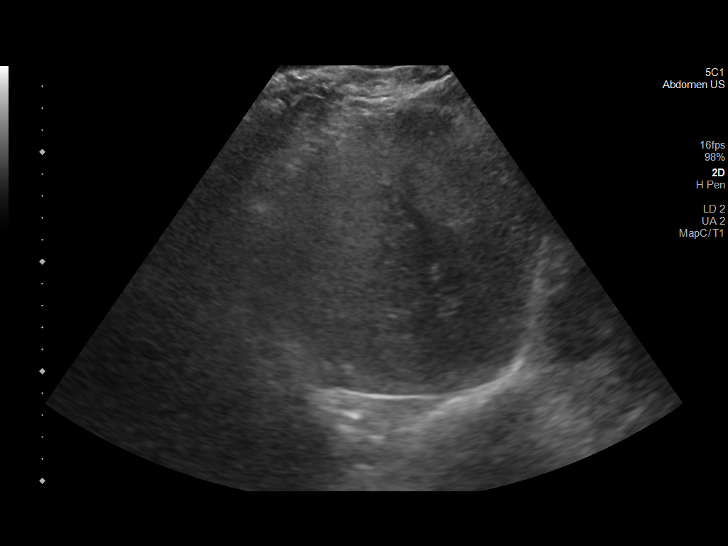
[im 4/10]
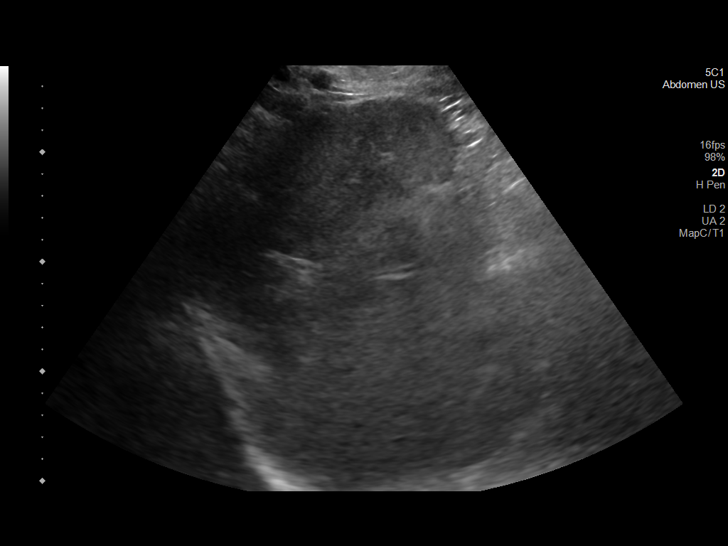
[im 5/10]
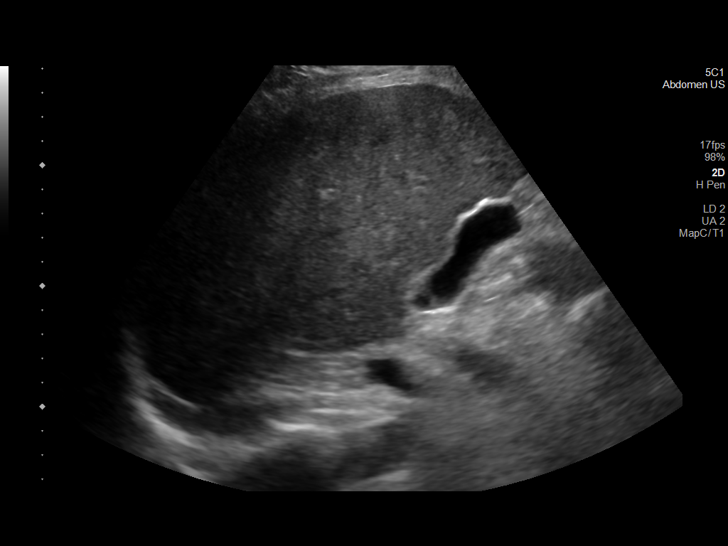
[im 6/10]
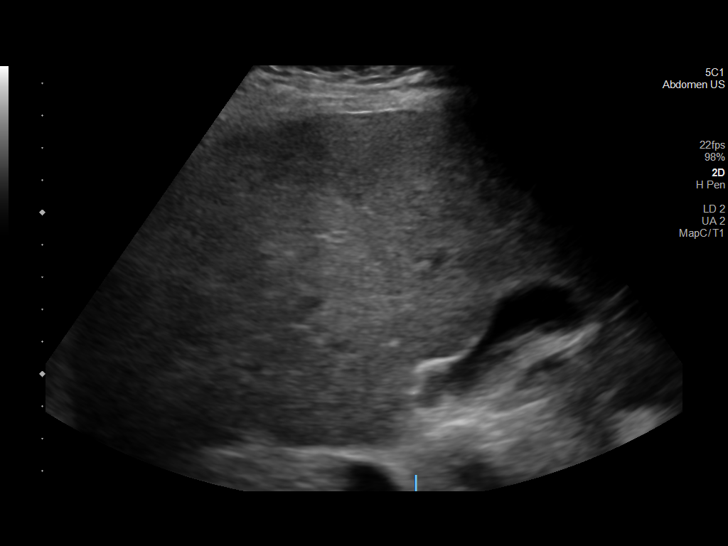
[im 7/10]
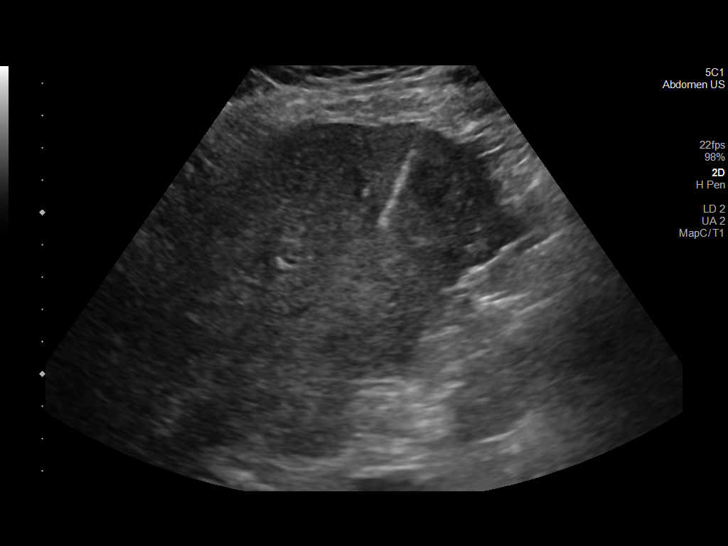
[im 8/10]
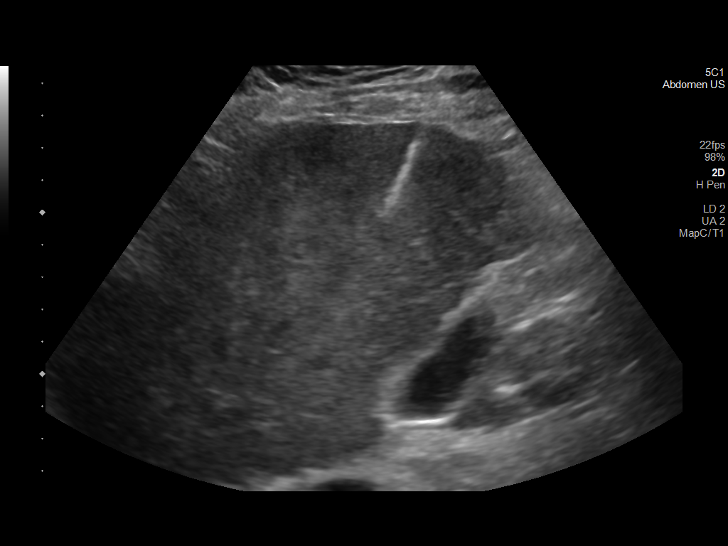
[im 9/10]
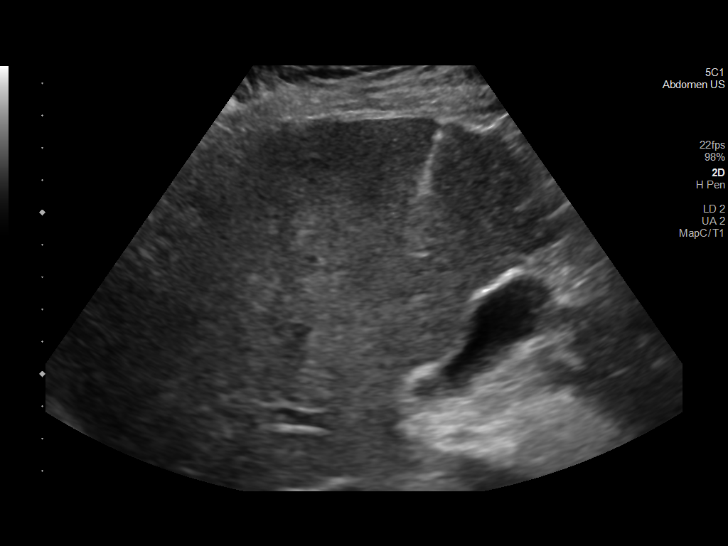
[im 10/10]
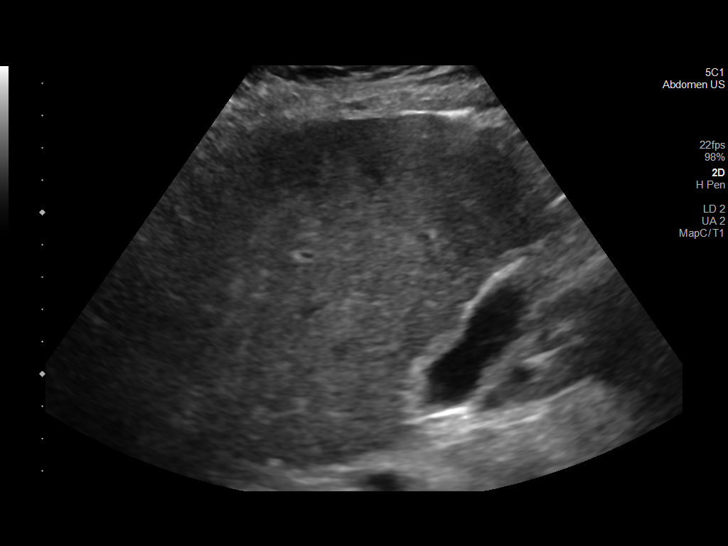

[10 of 10 positions shown; findings below may reference images not displayed]

EXAM:
ULTRASOUND-GUIDED MEDICAL LIVER BIOPSY

MEDICATIONS:
None.

ANESTHESIA/SEDATION:
Moderate (conscious) sedation was employed during this procedure. A
total of Versed 2.0 mg and Fentanyl 100 mcg was administered
intravenously.

Moderate Sedation Time: 10 minutes. The patient's level of
consciousness and vital signs were monitored continuously by
radiology nursing throughout the procedure under my direct
supervision.

FLUOROSCOPY TIME:  None

COMPLICATIONS:
None

PROCEDURE:
Informed written consent was obtained from the patient after a
thorough discussion of the procedural risks, benefits and
alternatives. All questions were addressed. Maximal Sterile Barrier
Technique was utilized including caps, mask, sterile gowns, sterile
gloves, sterile drape, hand hygiene and skin antiseptic. A timeout
was performed prior to the initiation of the procedure.

Ultrasound survey of the right liver lobe performed with images
stored and sent to PACs.

The right lower thorax/right upper abdomen was prepped with
chlorhexidine in a sterile fashion, and a sterile drape was applied
covering the operative field. A sterile gown and sterile gloves were
used for the procedure. Local anesthesia was provided with 1%
Lidocaine.

The patient was prepped and draped sterilely and the skin and
subcutaneous tissues were generously infiltrated with 1% lidocaine.

A 17 gauge introducer needle was then advanced under ultrasound
guidance in an intercostal location into the right liver lobe. The
stylet was removed, and multiple separate 18 gauge core biopsy were
retrieved. Samples were placed into formalin for transportation to
the lab.

Gel-Foam pledgets were then infused with a small amount of saline
for assistance with hemostasis.

The needle was removed, and a final ultrasound image was performed.

The patient tolerated the procedure well and remained
hemodynamically stable throughout.

No complications were encountered and no significant blood loss was
encounter.
IMPRESSION: Status post ultrasound-guided medical liver biopsy

## 2020-07-30 MED ORDER — FENTANYL CITRATE (PF) 100 MCG/2ML IJ SOLN
INTRAMUSCULAR | Status: AC | PRN
  Administered 2020-07-30: 25 ug via INTRAVENOUS
  Administered 2020-07-30: 50 ug via INTRAVENOUS
  Administered 2020-07-30: 25 ug via INTRAVENOUS

## 2020-07-30 MED ORDER — MIDAZOLAM HCL 2 MG/2ML IJ SOLN
INTRAMUSCULAR | Status: AC
  Filled 2020-07-30: qty 2

## 2020-07-30 MED ORDER — FENTANYL CITRATE (PF) 100 MCG/2ML IJ SOLN
INTRAMUSCULAR | Status: AC
  Filled 2020-07-30: qty 2

## 2020-07-30 MED ORDER — GELATIN ABSORBABLE 12-7 MM EX MISC
CUTANEOUS | Status: AC
  Filled 2020-07-30: qty 1

## 2020-07-30 MED ORDER — MIDAZOLAM HCL 2 MG/2ML IJ SOLN
INTRAMUSCULAR | Status: AC | PRN
  Administered 2020-07-30: 1 mg via INTRAVENOUS
  Administered 2020-07-30 (×2): 0.5 mg via INTRAVENOUS

## 2020-07-30 MED ORDER — SODIUM CHLORIDE 0.9 % IV SOLN
INTRAVENOUS | Status: AC | PRN
  Administered 2020-07-30: 10 mL/h via INTRAVENOUS

## 2020-07-30 MED ORDER — THIAMINE HCL 100 MG PO TABS: 100.0000 mg | ORAL_TABLET | Freq: Every day | ORAL | 0 refills | Status: DC

## 2020-07-30 MED ORDER — LIDOCAINE HCL (PF) 1 % IJ SOLN
INTRAMUSCULAR | Status: AC
  Filled 2020-07-30: qty 30

## 2020-07-30 MED ORDER — FOLIC ACID 1 MG PO TABS: 1.0000 mg | ORAL_TABLET | Freq: Every day | ORAL | 0 refills | Status: DC

## 2020-07-30 MED ORDER — HYDROXYZINE HCL 10 MG PO TABS: 10.0000 mg | ORAL_TABLET | Freq: Three times a day (TID) | ORAL | 0 refills | Status: DC

## 2020-07-30 MED ORDER — PANTOPRAZOLE SODIUM 40 MG PO TBEC: 40.0000 mg | DELAYED_RELEASE_TABLET | Freq: Two times a day (BID) | ORAL | 3 refills | Status: AC

## 2020-07-30 NOTE — Discharge Summary (Signed)
Physician Discharge Summary  Kristopher Ramos VZD:[id number] DOB: [birth year] DOA: 07/18/2020  PCP: Kristopher Glee., MD  Admit date: 07/18/2020 Discharge date: 07/30/2020  Time spent: 46 minutes  Recommendations for Outpatient Follow-up:  Outpatient LFTs CBC INR as per gastroenterologist  follow-up ANA that has been pending since 7/9 Follow-up liver biopsy pathology performed on 7/12 Has outpatient resources for EtOH cessation Given work note and letter on discharge  Discharge Diagnoses:  MAIN problem for hospitalization   GI bleed and acute hepatitis of unclear etiology  Please see below for itemized issues addressed in [place]- refer to other progress notes for clarity if needed  Discharge Condition: Improved  Diet recommendation: Low-salt heart healthy  Filed Weights   07/19/20 0012  Weight: 89.8 kg    History of present illness:  65 year old white male with known history of chronic EtOH 6 x 12 oz/day  HTN Anxiety Reflux Colonoscopy with polypectomy 12/2014 Recent diagnosis right neck herpes zoster Rx antivirals ibuprofen since May   1 month history generalized weakness + lightheadedness with positional change from standing to sitting more SOB   ER work-up 7/1 = hemoglobin 6.6 down from 13 Sodium 119LFTs 522/350Alk phos 166Total bili 2.0  Rx 2 units PRBCABD USG-Cirrhotic liver without masses 7/1 Endoscopy Dr. 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  NSAID [less than 1 % incidence],?  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  Precipitant liver disease HTN Losartan held from admission-resumed cautiously on discharge Anxiety Continue Celexa 20, hydroxyzine discontinued on discharge Declines use melatonin  Procedures: Liver biopsy   Consultations: Gastroenterology  Discharge Exam: Vitals:   07/30/20 0524 07/30/20 0848  BP: 116/65 116/79  Pulse: 70 75  Resp: 17 18  Temp: 98.2 F (36.8 C) 98.5 F (36.9 C)  SpO2: 98% 100%    Subj on day of d/c   Awake coherent no distress sitting at the bedside n.p.o. for liver biopsy  General Exam on discharge   NCAT no focal deficit no icterus no pallor Chest clear no added sound no rales no rhonchi Abdomen soft no rebound no guarding no right upper quadrant tenderness Lower extremities are not swollen he has no flap Mentation is good  Discharge Instructions   Discharge Instructions     Diet - low sodium heart healthy   Complete by: As  directed    Discharge instructions   Complete by: As directed    Do not take any over-the-counter's without first consulting your  physician For nonsevere pain for example you can use sparing doses of Naprosyn over-the-counter Please follow-up with gastroenterologist in the outpatient setting who will set you up for follow-up of your biopsy in  addition to lab work Look at your meds carefully you will be on an acid reducer twice a day to be taken and you should also take your folic acid and thiamine at this time  You will be given a work excuse letter   Increase activity slowly   Complete by: As directed       Allergies as of 07/30/2020   No Known Allergies      Medication List     STOP taking these medications    amLODipine 10 MG tablet Commonly known as: NORVASC   gabapentin 100 MG capsule Commonly known as: NEURONTIN   ibuprofen 200 MG tablet Commonly known as: ADVIL   losartan 100 MG tablet Commonly known as: COZAAR   valACYclovir 1000 MG tablet Commonly known as: VALTREX       TAKE these medications    atorvastatin 20 MG tablet Commonly known as: LIPITOR Take 20 mg by mouth daily.   citalopram 20 MG tablet Commonly known as: CELEXA Take 20 mg by mouth daily.   EPINEPHrine 0.3 mg/0.3 mL Soaj injection Commonly known as: EPI-PEN Inject 0.3 mg into the muscle as needed for anaphylaxis.   folic acid 1 MG tablet Commonly known as: FOLVITE Take 1 tablet (1 mg total) by mouth daily. Start taking on: July 31, 2020   pantoprazole 40 MG tablet Commonly known as: PROTONIX Take 1 tablet (40 mg total) by mouth 2 (two) times daily. What changed: when to take this   thiamine 100 MG tablet Take 1 tablet (100 mg total) by mouth daily. Start taking on: July 31, 2020       No Known Allergies  Follow-up Information     Rhoton, Shelda Altes., MD Follow up on 08/22/2020.   Specialty: Gastroenterology Why: 3:30 PM for follow up of liver issues. this in place of appt with Dr Lyndel Safe. Contact information: [address] [place] [address] [phone]                   The results of significant diagnostics from this hospitalization (including imaging, microbiology, ancillary and laboratory) are listed below for reference.    Significant Diagnostic Studies: CT ABDOMEN W CONTRAST  Result Date: 07/25/2020 CLINICAL DATA:  Jaundice.  Cirrhosis. EXAM: CT ABDOMEN WITH CONTRAST TECHNIQUE: Multidetector CT imaging of the abdomen was performed using the standard protocol following bolus administration of intravenous contrast. CONTRAST:  19mL OMNIPAQUE IOHEXOL 300 MG/ML  SOLN COMPARISON:  01/22/2009 FINDINGS: Lower chest: Unremarkable. Hepatobiliary: Nodular liver contour compatible with the reported clinical history of cirrhosis. Regional heterogeneous enhancement identified in the dome of the right liver. Gallbladder is decompressed. Insert normal bili Pancreas: No focal mass lesion. No dilatation of the main duct. No intraparenchymal cyst. No peripancreatic edema. Spleen: Tiny wedge-shaped area of decreased enhancement identified in the medial spleen (18/3) suggesting small infarct. No substantial splenomegaly. Adrenals/Urinary Tract: No adrenal nodule or mass. Kidneys unremarkable. Stomach/Bowel: Fundal diverticulum noted. Stomach otherwise unremarkable. Duodenum is normally positioned as is the ligament of Treitz. Duodenal diverticulum noted. No small bowel or colonic dilatation within the visualized abdomen. Vascular/Lymphatic: There is moderate calcified aortic atherosclerosis  without aneurysm. Portal vein, superior mesenteric vein, and splenic vein are patent. 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 There is no gastrohepatic or hepatoduodenal ligament lymphadenopathy. No retroperitoneal or mesenteric lymphadenopathy. Other: No intraperitoneal free fluid. Musculoskeletal: No worrisome lytic or sclerotic osseous abnormality. IMPRESSION: 1. Cirrhotic liver morphology with an area of subtle heterogeneous enhancement towards the dome. MRI  abdomen with and without contrast recommended to further evaluate. 2. No intra or extrahepatic biliary duct dilatation. 3. No overt findings of portal venous hypertension. Electronically Signed   By: Misty Stanley M.D.   On: 07/25/2020 18:01   MR ABDOMEN W WO CONTRAST  Result Date: 07/26/2020 CLINICAL DATA:  Further evaluation of abnormality seen on recent CT EXAM: MRI ABDOMEN WITHOUT AND WITH CONTRAST TECHNIQUE: Multiplanar multisequence MR imaging of the abdomen was performed both before and after the administration of intravenous contrast. CONTRAST:  8.71mL GADAVIST GADOBUTROL 1 MMOL/ML IV SOLN COMPARISON:  CT July 25, 2020 FINDINGS: Lower chest: No acute abnormality. Hepatobiliary: Nodular hepatic contour compatible with history of cirrhosis. Corresponding with findings on prior CT dome of liver there is a non masslike area of heterogeneous signal intensity which demonstrates some increased T2 signal with low intrinsic T1 signal which does not demonstrate demonstrate immediate postcontrast enhancement but does demonstrate linear serpiginous band of enhancement on more delayed imaging, most consistent with fibrosis. There is a single arterially hyperenhancing 7 mm focus in the peripheral right lobe of the liver on image 24/14 which demonstrates washout but without definite suspicious features. No additional arterially enhancing hepatic lesions. Gallbladder is decompressed otherwise unremarkable. No biliary ductal dilation. Pancreas: Intrinsic T1 signal of the pancreatic parenchyma is within normal limits. No pancreatic ductal dilation. No cystic or solid enhancing pancreatic masses. Spleen: Mild splenomegaly. Small which shaped area hypoenhancement along the medial aspect of the spleen likely representing a small infarction. Adrenals/Urinary Tract: No masses identified. No evidence of hydronephrosis. Stomach/Bowel: Gastric diverticulum. Duodenal diverticulum. No pathologic dilation of small bowel.  Vascular/Lymphatic: Abdominal aortic aneurysm. The portal, splenic and superior mesenteric veins are patent. Prominent periportal lymph nodes measuring up to 7 mm. Pathologically enlarged abdominal lymph nodes. Other:  No abdominal ascites. Musculoskeletal: No suspicious osseous lesions. IMPRESSION: 1. Heterogeneous signal intensity hepatic dome, corresponding with the finding on prior CT is most consistent with fibrosis. 2. Cirrhotic hepatic morphology with mild splenomegaly suggestive portal hypertension. No ascites. 3. Arterially enhancing 7 mm focus in the peripheral right lobe of the liver which demonstrates washout but without definite suspicious features, consistent with a LI-RADS category 3 hepatic lesion. Electronically Signed   By: Dahlia Bailiff MD   On: 07/26/2020 23:17   US Abdomen Limited RUQ (LIVER/GB)  Result Date: 07/19/2020 CLINICAL DATA:  Cirrhosis EXAM: ULTRASOUND ABDOMEN LIMITED RIGHT UPPER QUADRANT COMPARISON:  None. FINDINGS: Gallbladder: No gallstones or wall thickening visualized. No sonographic Murphy sign noted by sonographer. Common bile duct: Diameter: 2 mm in proximal diameter Liver: The liver contour is nodular in keeping with changes of cirrhosis. No focal intrahepatic masses identified. No intrahepatic biliary ductal dilation. Liver size is within normal limits. Portal vein is patent on color Doppler imaging with normal direction of blood flow towards the liver. Other: No ascites IMPRESSION: Cirrhotic change of the liver. No focal intrahepatic mass identified. Electronically Signed   By: Fidela Salisbury MD   On: 07/19/2020 04:46    Microbiology: No results found for this or any previous visit (from the past 240 hour(s)).   Labs: Basic  Metabolic Panel: Recent Labs  Lab 07/26/20 0118 07/27/20 1016 07/28/20 0823 07/29/20 0126 07/30/20 0021  NA 131* 132* 133* 134* 135  K 3.6 4.1 3.6 3.7 3.5  CL 103 105 105 107 105  CO2 20* 18* 22 22 22   GLUCOSE 125* 179* 115* 125*  137*  BUN 7* 9 8 6* 6*  CREATININE 0.87 0.87 0.86 0.87 0.89  CALCIUM 8.3* 8.3* 8.5* 8.2* 8.2*   Liver Function Tests: Recent Labs  Lab 07/26/20 0118 07/27/20 1016 07/28/20 0823 07/29/20 0126 07/30/20 0021  AST 627* 560* 539* 415* 331*  ALT 430* 413* 406* 309* 268*  ALKPHOS 135* 147* 164* 144* 134*  BILITOT 5.1* 6.1* 6.3* 4.9* 4.9*  PROT 6.0* 6.2* 6.8 5.9* 6.0*  ALBUMIN 2.5* 2.4* 2.6* 2.3* 2.3*   No results for input(s): LIPASE, AMYLASE in the last 168 hours. Recent Labs  Lab 07/24/20 0059  AMMONIA 71*   CBC: Recent Labs  Lab 07/23/20 1851 07/24/20 0059 07/26/20 0118 07/27/20 1016 07/29/20 0126  WBC 8.3 7.1 6.5 6.3 6.2  NEUTROABS  --   --  3.8 3.8 3.4  HGB 9.2* 9.1* 8.5* 9.0* 8.7*  HCT 29.4* 28.6* 27.4* 30.1* 26.8*  MCV 75.4* 74.1* 75.1* 78.2* 74.0*  PLT 209 200 189 196 201   Cardiac Enzymes: No results for input(s): CKTOTAL, CKMB, CKMBINDEX, TROPONINI in the last 168 hours. BNP: BNP (last 3 results) No results for input(s): BNP in the last 8760 hours.  ProBNP (last 3 results) No results for input(s): PROBNP in the last 8760 hours.  CBG: No results for input(s): GLUCAP in the last 168 hours.     Signed:  Nita Sells MD   [facility] 07/30/2020, 11:26 AM

## 2020-07-30 NOTE — Progress Notes (Signed)
Pt discharged home in stable condition 

## 2020-07-30 NOTE — Telephone Encounter (Signed)
Patient's previous follow up with Dr. Lyndel Safe had been cancelled because patient was established with a GI provider at [facility]. Received a message from Dr. Carlean Purl stating that patient wishes to continue care with our office. Patient has been rescheduled for a follow up with Dr. Lyndel Safe on Tuesday, 09/03/20 at 9:10 am. Per Dr. Carlean Purl patient will need repeat labs on Friday. Patient is aware that he will repeat labs at the [facility] office, advised that we will send this information to his my chart. Patient verbalized understanding of all information and had no concerns at the end of the call.

## 2020-07-30 NOTE — Procedures (Signed)
Interventional Radiology Procedure Note  Procedure: US guided biopsy of liver, medical liver bx Complications: None EBL: None Recommendations: - Bedrest 2 hours.   - Routine wound care - Follow up pathology - Advance diet OK per primary team  Signed,  Corrie Mckusick, DO

## 2020-07-30 NOTE — Progress Notes (Signed)
   Patient Name: Kristopher Ramos Date of Encounter: 07/30/2020, 10:59 AM    Subjective  Waiting for liver bx   Objective  BP 116/79 (BP Location: Right Arm)   Pulse 75   Temp 98.5 F (36.9 C) (Oral)   Resp 18   Ht 5\' 6"  (1.676 m)   Wt 89.8 kg   SpO2 100%   BMI 31.96 kg/m   Recent Labs  Lab 07/24/20 0747 07/25/20 0849 07/26/20 0118 07/27/20 1016 07/28/20 0823 07/28/20 1014 07/29/20 0126 07/30/20 0021  AST  --    < > 627* 560* 539*  --  415* 331*  ALT  --    < > 430* 413* 406*  --  309* 268*  ALKPHOS  --    < > 135* 147* 164*  --  144* 134*  BILITOT  --    < > 5.1* 6.1* 6.3*  --  4.9* 4.9*  PROT  --    < > 6.0* 6.2* 6.8  --  5.9* 6.0*  ALBUMIN  --    < > 2.5* 2.4* 2.6*  --  2.3* 2.3*  INR 1.4*  --  1.5*  --   --  1.4* 1.5*  --    < > = values in this interval not displayed.      Assessment and Plan  Alcoholic cirrhosis Abnl LFT beyond expected  Liver bx today  Home after that  I have arranged for him to do labs Friday 7/15 at my office  Gatha Mayer, MD, [facility] 07/30/2020 10:59 AM

## 2020-07-31 LAB — SURGICAL PATHOLOGY

## 2020-08-01 ENCOUNTER — Telehealth: Payer: Self-pay | Admitting: Gastroenterology

## 2020-08-01 LAB — ANTINUCLEAR ANTIBODIES, IFA: ANA Ab, IFA: NEGATIVE

## 2020-08-01 NOTE — Telephone Encounter (Signed)
Pt's wife called in tears because results of biopsy are available in Mychart and it stated stage 4 cirrhosis. She was concerned that her husband was going to see this before Dr. Lyndel Safe even called him. She stated that it is cruel to learn about it this way. She is requesting a call back asap with the results.

## 2020-08-01 NOTE — Telephone Encounter (Signed)
Discussed liver biopsy results in detail with the patient and patient's husband Biopsy reveals grade 2-3/3 steatohepatitis with stage IV of 4 liver cirrhosis Strict abstinence from alcohol discussed Low salt diet He would FU in GI clinic regularly  RG

## 2020-08-01 NOTE — Telephone Encounter (Signed)
Text message sent to Dr. Lyndel Safe requesting he call pts wife as she is upset. Called wife and explained that we have no control unfortunately regarding the results being released to [place]. Explained that we do not like when this happens either. She asked for this RN's last name and "thanked me for my compassion and spewing all that information out." Explained that I had sent a phone note and a text  message requesting that Dr. Lyndel Safe call her soon to explain the results.

## 2020-08-01 NOTE — Telephone Encounter (Signed)
Inbound call from pt's wife requesting a call back stating that she has received the pt's results on liver test via Mychart and would like for it to be explained to her. Please advise. Thanks.

## 2020-08-01 NOTE — Telephone Encounter (Signed)
Pts wife calling requesting results of liver biopsy, please advise.

## 2020-08-01 NOTE — Telephone Encounter (Signed)
Dr. Lyndel Safe sent a message back that he will call the pts wife back after he completes his last ECL in about an hour or so. Called pts wife and let her know.

## 2020-08-02 ENCOUNTER — Other Ambulatory Visit: Payer: Self-pay

## 2020-08-02 ENCOUNTER — Other Ambulatory Visit (INDEPENDENT_AMBULATORY_CARE_PROVIDER_SITE_OTHER): Payer: 59

## 2020-08-02 DIAGNOSIS — K7030 Alcoholic cirrhosis of liver without ascites: Secondary | ICD-10-CM

## 2020-08-02 LAB — CBC
HCT: 29.9 % — ABNORMAL LOW (ref 39.0–52.0)
Hemoglobin: 9.6 g/dL — ABNORMAL LOW (ref 13.0–17.0)
MCHC: 32.1 g/dL (ref 30.0–36.0)
MCV: 76.1 fl — ABNORMAL LOW (ref 78.0–100.0)
Platelets: 215 10*3/uL (ref 150.0–400.0)
RBC: 3.93 Mil/uL — ABNORMAL LOW (ref 4.22–5.81)
RDW: 30 % — ABNORMAL HIGH (ref 11.5–15.5)
WBC: 5.6 10*3/uL (ref 4.0–10.5)

## 2020-08-02 LAB — COMPREHENSIVE METABOLIC PANEL
ALT: 174 U/L — ABNORMAL HIGH (ref 0–53)
AST: 208 U/L — ABNORMAL HIGH (ref 0–37)
Albumin: 3 g/dL — ABNORMAL LOW (ref 3.5–5.2)
Alkaline Phosphatase: 171 U/L — ABNORMAL HIGH (ref 39–117)
BUN: 9 mg/dL (ref 6–23)
CO2: 21 mEq/L (ref 19–32)
Calcium: 8.5 mg/dL (ref 8.4–10.5)
Chloride: 103 mEq/L (ref 96–112)
Creatinine, Ser: 0.88 mg/dL (ref 0.40–1.50)
GFR: 91.47 mL/min (ref >60.00)
Glucose, Bld: 152 mg/dL — ABNORMAL HIGH (ref 70–99)
Potassium: 3.7 mEq/L (ref 3.5–5.1)
Sodium: 133 mEq/L — ABNORMAL LOW (ref 135–145)
Total Bilirubin: 5.2 mg/dL — ABNORMAL HIGH (ref 0.2–1.2)
Total Protein: 6.4 g/dL (ref 6.0–8.3)

## 2020-08-03 ENCOUNTER — Encounter: Payer: Self-pay | Admitting: Gastroenterology

## 2020-08-07 LAB — ANTINUCLEAR ANTIBODIES, IFA: ANA Ab, IFA: NEGATIVE

## 2020-08-20 NOTE — Telephone Encounter (Signed)
Either way it would be fine I know Dr. Dorrene German.  He will take excellent care of him as well.  RG

## 2020-08-28 ENCOUNTER — Ambulatory Visit: Payer: 59 | Admitting: Gastroenterology

## 2020-09-03 ENCOUNTER — Ambulatory Visit (INDEPENDENT_AMBULATORY_CARE_PROVIDER_SITE_OTHER): Payer: 59 | Admitting: Gastroenterology

## 2020-09-03 ENCOUNTER — Encounter: Payer: Self-pay | Admitting: Gastroenterology

## 2020-09-03 ENCOUNTER — Other Ambulatory Visit: Payer: Self-pay

## 2020-09-03 ENCOUNTER — Other Ambulatory Visit (INDEPENDENT_AMBULATORY_CARE_PROVIDER_SITE_OTHER): Payer: 59

## 2020-09-03 VITALS — BP 130/80 | HR 75 | Ht 66.0 in | Wt 189.5 lb

## 2020-09-03 DIAGNOSIS — Z86010 Personal history of colonic polyps: Secondary | ICD-10-CM

## 2020-09-03 DIAGNOSIS — D509 Iron deficiency anemia, unspecified: Secondary | ICD-10-CM | POA: Diagnosis not present

## 2020-09-03 DIAGNOSIS — K7460 Unspecified cirrhosis of liver: Secondary | ICD-10-CM

## 2020-09-03 DIAGNOSIS — K7030 Alcoholic cirrhosis of liver without ascites: Secondary | ICD-10-CM | POA: Diagnosis not present

## 2020-09-03 LAB — COMPREHENSIVE METABOLIC PANEL
ALT: 32 U/L (ref 0–53)
AST: 52 U/L — ABNORMAL HIGH (ref 0–37)
Albumin: 3.2 g/dL — ABNORMAL LOW (ref 3.5–5.2)
Alkaline Phosphatase: 163 U/L — ABNORMAL HIGH (ref 39–117)
BUN: 7 mg/dL (ref 6–23)
CO2: 26 mEq/L (ref 19–32)
Calcium: 9.1 mg/dL (ref 8.4–10.5)
Chloride: 103 mEq/L (ref 96–112)
Creatinine, Ser: 0.76 mg/dL (ref 0.40–1.50)
GFR: 95.55 mL/min (ref >60.00)
Glucose, Bld: 161 mg/dL — ABNORMAL HIGH (ref 70–99)
Potassium: 4.1 mEq/L (ref 3.5–5.1)
Sodium: 136 mEq/L (ref 135–145)
Total Bilirubin: 2.1 mg/dL — ABNORMAL HIGH (ref 0.2–1.2)
Total Protein: 6.7 g/dL (ref 6.0–8.3)

## 2020-09-03 LAB — PROTIME-INR
INR: 1.4 ratio — ABNORMAL HIGH (ref 0.8–1.0)
Prothrombin Time: 15.1 s — ABNORMAL HIGH (ref 9.6–13.1)

## 2020-09-03 LAB — AMMONIA: Ammonia: 33 umol/L (ref 11–35)

## 2020-09-03 MED ORDER — LACTULOSE 10 GM/15ML PO SOLN: 20.0000 g | Freq: Two times a day (BID) | ORAL | 6 refills | Status: DC

## 2020-09-03 NOTE — Patient Instructions (Addendum)
If you are age 64 or older, your body mass index should be between 23-30. Your Body mass index is 30.59 kg/m. If this is out of the aforementioned range listed, please consider follow up with your Primary Care Provider.  If you are age 56 or younger, your body mass index should be between 19-25. Your Body mass index is 30.59 kg/m. If this is out of the aformentioned range listed, please consider follow up with your Primary Care Provider.   __________________________________________________________  The [place] [facility] providers would like to encourage you to use [facility] to communicate with providers for non-urgent requests or questions.  Due to long hold times on the telephone, sending your provider a message by [facility] may be a faster and more efficient way to get a response.  Please allow 48 business hours for a response.  Please remember that this is for non-urgent requests.   Please go to the lab on the 2nd floor [address] before you leave the office today.   We will be scheduling your EGD and Colonoscopy at [facility]. If you haven't heard anything in 2 weeks please give [place] a call at [phone]  Continue iron 2 times a day   We have sent the following medications to your pharmacy for you to pick up at your convenience: Lactulose. Please titrate to 2-3 bowel movements a day.  Please call with any questions or concerns.  Thank you,  Dr. Jackquline Denmark

## 2020-09-03 NOTE — Progress Notes (Signed)
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  No ascites.   Subclinical hepatic encephalopathy.  Small liver lesion on MRI 07/2020. LI-RADS category 3 hepatic lesion. 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  Hb 6.6 on Adm s/p 2U to 8.4. Most recent Hb 10.2. (Baseline 13 40mhs ago) H/O NSAID intake.  H/O tubular adenomas on colon 2016 (Dr RDorrene German  Plan: -CMP, PT INR, NH3 -Check HBsAb titer and HAV total Ab.  If neg, would recommend vaccination for A and B. -[facility] -Continue iron BID -Stop all nonsteroidals. -Lactulose 30 cc po BID. Titrate 2-3 Bms/day.  I also discussed regarding rifaximin. -D/W pt and pt's wife extensively.   I discussed EGD/Colonoscopy- the indications, risks, alternatives and potential complications including, but not limited to, bleeding, infection, reaction to medication, damage to internal organs, cardiac and/or pulmonary problems, and perforation requiring surgery (1 to 2 in 1000). The possibility that significant findings could be missed was explained. All ? were answered. The patient gives consent to proceed. HPI:    Kristopher Ramos a 65y.o. male  [facility] chronic EtOH cirrhosis 07/2020 (s/p liver bx as below).  He was found to have microcytic anemia with heme-negative stools. Hb 6.6 s/p 2U to 8.4.  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  Follow-up visit from above hospitalization.  No GI bleeding.  Most recent hemoglobin 2 weeks ago was 10.2 with MCV 79.  Per patient's wife, he does get somewhat confused.  He also has constipation with BMs 1 every other day.  No melena or hematochezia.  No abdominal pain.  No nausea, vomiting.  Has stopped drinking all alcohol.  Last drink 07/19/2020.  Has stopped nonsteroidals as well.  Wt  Readings from Last 3 Encounters:  09/03/20 189 lb 8 oz (86 kg)  07/19/20 198 lb (89.8 kg)     Previous GI/liver work-up:  Last EtOH use 07/19/2020.  Liver bx 07/30/2020 -Moderate to severely active steatohepatitis (grade 2-3 of 3) with  cirrhosis (stage 4 of 4).   -neg HSV 1/2 -neg acute hep  -non reactive HBsAb 07/2020 -AFP 2/7 07/2020 -neg A1AT, ANA, AMA  EGD 07/19/2020 - GAVE. Treated with RFA followed by APC - Mild portal hypertensive gastropathy. - Gastric polyps s/p polypectomy (x 2) - No EV  [place] Cirrhotic change of the liver. No focal intrahepatic mass identified. No ascites.   CT AP 07/2020 1. Cirrhotic liver morphology with an area of subtle heterogeneous enhancement towards the dome. MRI abdomen with and without contrast recommended to further evaluate. 2. No intra or extrahepatic biliary duct dilatation. 3. No overt findings of portal venous hypertension.  MRI 07/2020 1. Heterogeneous signal intensity hepatic dome, corresponding with the finding on prior CT is most consistent with fibrosis. 2. Cirrhotic hepatic morphology with mild splenomegaly suggestive portal hypertension. No ascites. 3. Arterially enhancing 7 mm focus in the peripheral right lobe of the liver which demonstrates washout but without definite suspicious features, consistent with a LI-RADS category 3 hepatic lesion. Past Medical History:  Diagnosis Date   Alcohol abuse 07/19/2020   Benign essential hypertension 01/08/2015   Generalized anxiety disorder 07/19/2020   GERD (gastroesophageal reflux disease) 07/19/2020   Mixed hyperlipidemia 09/21/2017  Past Surgical History:  Procedure Laterality Date   BIOPSY  07/19/2020   Procedure: BIOPSY;  Surgeon: Jackquline Denmark, MD;  Location: [facility] ENDOSCOPY;  Service: Endoscopy;;   ESOPHAGOGASTRODUODENOSCOPY (EGD) WITH PROPOFOL N/A 07/19/2020   Procedure: ESOPHAGOGASTRODUODENOSCOPY (EGD) WITH PROPOFOL;  Surgeon: Jackquline Denmark, MD;  [facility]  END[facility]COPY;  Service: Endoscopy;  Laterality: N/A;   GI RADIOFREQUENCY ABLATION  07/19/2020   Procedure: GI RADIOFREQUENCY ABLATION;  Surgeon: Jackquline Denmark, MD;  Location: [facility] ENDOSCOPY;  Service: Endoscopy;;   POLYPECTOMY  07/19/2020   Procedure: POLYPECTOMY;  Surgeon: Jackquline Denmark, MD;  Location: [facility] ENDOSCOPY;  Service: Endoscopy;;    Family History  Problem Relation Age of Onset   High blood pressure Mother    Dementia Mother    Ulcerative colitis Father    Stomach cancer Neg Hx    Colon cancer Neg Hx    Pancreatic cancer Neg Hx     Social History   Tobacco Use   Smoking status: Never   Smokeless tobacco: Current    Types: Snuff    Current Outpatient Medications  Medication Sig Dispense Refill   atorvastatin (LIPITOR) 20 MG tablet Take 20 mg by mouth daily.     citalopram (CELEXA) 20 MG tablet Take 20 mg by mouth daily.     EPINEPHrine 0.3 mg/0.3 mL IJ SOAJ injection Inject 0.3 mg into the muscle as needed for anaphylaxis.     folic acid (FOLVITE) 1 MG tablet Take 1 tablet (1 mg total) by mouth daily. 60 tablet 0   hydrOXYzine (ATARAX/VISTARIL) 10 MG tablet Take 1 tablet (10 mg total) by mouth 3 (three) times daily. 90 tablet 0   pantoprazole (PROTONIX) 40 MG tablet Take 1 tablet (40 mg total) by mouth 2 (two) times daily. 60 tablet 3   thiamine 100 MG tablet Take 1 tablet (100 mg total) by mouth daily. 30 tablet 0   No current facility-administered medications for this visit.    No Known Allergies  Review of Systems:  Constitutional: Denies fever, chills, diaphoresis, appetite change and has fatigue.  HEENT: Denies photophobia, eye pain, redness, hearing loss, ear pain, congestion, sore throat, rhinorrhea, sneezing, mouth sores, neck pain, neck stiffness and tinnitus.   Respiratory: Denies SOB, DOE, cough, chest tightness,  and wheezing.   Cardiovascular: Denies chest pain, palpitations and leg swelling.  Genitourinary: Denies dysuria, urgency, frequency, hematuria, flank  pain and difficulty urinating.  Musculoskeletal: Denies myalgias, back pain, joint swelling, arthralgias and gait problem.  Skin: No rash.  Neurological: Denies dizziness, seizures, syncope, weakness, light-headedness, numbness and headaches.  Hematological: Denies adenopathy. Easy bruising, personal or family bleeding history  Psychiatric/Behavioral: No anxiety or depression     Physical Exam:    BP 130/80   Pulse 75   Ht '5\' 6"'$  (1.676 m)   Wt 189 lb 8 oz (86 kg)   SpO2 99%   BMI 30.59 kg/m  Wt Readings from Last 3 Encounters:  09/03/20 189 lb 8 oz (86 kg)  07/19/20 198 lb (89.8 kg)   Constitutional:  Well-developed, in no acute distress. Psychiatric: Normal mood and affect. Behavior is normal. HEENT: Pupils normal.  Conjunctivae are normal. No scleral icterus. Cardiovascular: Normal rate, regular rhythm. No edema Pulmonary/chest: Effort normal and breath sounds normal. No wheezing, rales or rhonchi. Abdominal: Soft, nondistended. Nontender. Bowel sounds active throughout. There are no masses palpable. No hepatomegaly. Rectal: Deferred Neurological: Alert and oriented to person place and time. Skin: Skin is warm and dry. No rashes noted.  Data  Reviewed: I have personally reviewed following labs and imaging studies  CBC: CBC Latest Ref Rng & Units 08/02/2020 07/29/2020 07/27/2020  WBC 4.0 - 10.5 K/uL 5.6 6.2 6.3  Hemoglobin 13.0 - 17.0 g/dL 9.6(L) 8.7(L) 9.0(L)  Hematocrit 39.0 - 52.0 % 29.9(L) 26.8(L) 30.1(L)  Platelets 150.0 - 400.0 K/uL 215.0 201 196    CMP: CMP Latest Ref Rng & Units 08/02/2020 07/30/2020 07/29/2020  Glucose 70 - 99 mg/dL 152(H) 137(H) 125(H)  BUN 6 - 23 mg/dL 9 6(L) 6(L)  Creatinine 0.40 - 1.50 mg/dL 0.88 0.89 0.87  Sodium 135 - 145 mEq/L 133(L) 135 134(L)  Potassium 3.5 - 5.1 mEq/L 3.7 3.5 3.7  Chloride 96 - 112 mEq/L 103 105 107  CO2 19 - 32 mEq/L '21 22 22  '$ Calcium 8.4 - 10.5 mg/dL 8.5 8.2(L) 8.2(L)  Total Protein 6.0 - 8.3 g/dL 6.4 6.0(L) 5.9(L)   Total Bilirubin 0.2 - 1.2 mg/dL 5.2(H) 4.9(H) 4.9(H)  Alkaline Phos 39 - 117 U/L 171(H) 134(H) 144(H)  AST 0 - 37 U/L 208(H) 331(H) 415(H)  ALT 0 - 53 U/L 174(H) 268(H) 309(H)        Carmell Austria, MD 09/03/2020, 9:11 AM  Cc: Kristopher Glee., MD

## 2020-09-04 LAB — HEPATITIS B SURFACE ANTIBODY, QUANTITATIVE: Hep B S AB Quant (Post): 5 m[IU]/mL — ABNORMAL LOW (ref >=10)

## 2020-09-04 LAB — HEPATITIS A ANTIBODY, TOTAL: Hepatitis A AB,Total: REACTIVE — AB

## 2020-09-17 ENCOUNTER — Other Ambulatory Visit: Payer: Self-pay

## 2020-09-17 DIAGNOSIS — K7030 Alcoholic cirrhosis of liver without ascites: Secondary | ICD-10-CM

## 2020-09-17 DIAGNOSIS — D509 Iron deficiency anemia, unspecified: Secondary | ICD-10-CM

## 2020-09-17 DIAGNOSIS — Z86010 Personal history of colonic polyps: Secondary | ICD-10-CM

## 2020-09-17 DIAGNOSIS — K219 Gastro-esophageal reflux disease without esophagitis: Secondary | ICD-10-CM

## 2020-09-17 NOTE — Telephone Encounter (Signed)
.  A user error has taken place: error

## 2020-09-17 NOTE — Addendum Note (Signed)
Addended by: Curlene Labrum E on: 09/17/2020 08:56 AM   Modules accepted: Orders, SmartSet

## 2020-10-14 ENCOUNTER — Telehealth: Payer: Self-pay | Admitting: Gastroenterology

## 2020-10-14 NOTE — Telephone Encounter (Signed)
Pt states that he has been feeling tired and fatigue after he was discharged from the hospital in July. Pt states that he does have  some abdominal pain but his main complaint is feeling tired and fatigue. Pt was encouraged to see PCP in regard to the symptoms of fatigue. Pt stated that he would like to be seen by Dr. Lyndel Safe. Pt was offered to see an APP due to the fact that Dr. Lyndel Safe is booked up for the next several weeks in which he accepted. Appointment was scheduled with Tye Savoy NP  on 10/22/2020 @ 1:30. Pt verbalized understanding with all questions answered.

## 2020-10-14 NOTE — Telephone Encounter (Signed)
Inbound call from patient's wife requesting a call please.  States patient has been experiencing severe abdominal pain and has been feeling very fatigue for the past couple of days.

## 2020-10-22 ENCOUNTER — Encounter: Payer: Self-pay | Admitting: Nurse Practitioner

## 2020-10-22 ENCOUNTER — Other Ambulatory Visit (INDEPENDENT_AMBULATORY_CARE_PROVIDER_SITE_OTHER): Payer: 59

## 2020-10-22 ENCOUNTER — Ambulatory Visit (INDEPENDENT_AMBULATORY_CARE_PROVIDER_SITE_OTHER): Payer: 59 | Admitting: Nurse Practitioner

## 2020-10-22 VITALS — BP 120/70 | HR 73 | Ht 66.0 in | Wt 192.0 lb

## 2020-10-22 DIAGNOSIS — K31819 Angiodysplasia of stomach and duodenum without bleeding: Secondary | ICD-10-CM

## 2020-10-22 DIAGNOSIS — K7030 Alcoholic cirrhosis of liver without ascites: Secondary | ICD-10-CM

## 2020-10-22 LAB — COMPREHENSIVE METABOLIC PANEL
ALT: 26 U/L (ref 0–53)
AST: 48 U/L — ABNORMAL HIGH (ref 0–37)
Albumin: 3.5 g/dL (ref 3.5–5.2)
Alkaline Phosphatase: 144 U/L — ABNORMAL HIGH (ref 39–117)
BUN: 10 mg/dL (ref 6–23)
CO2: 26 mEq/L (ref 19–32)
Calcium: 9.7 mg/dL (ref 8.4–10.5)
Chloride: 101 mEq/L (ref 96–112)
Creatinine, Ser: 0.75 mg/dL (ref 0.40–1.50)
GFR: 95.85 mL/min (ref >60.00)
Glucose, Bld: 89 mg/dL (ref 70–99)
Potassium: 4.1 mEq/L (ref 3.5–5.1)
Sodium: 135 mEq/L (ref 135–145)
Total Bilirubin: 1.4 mg/dL — ABNORMAL HIGH (ref 0.2–1.2)
Total Protein: 7.2 g/dL (ref 6.0–8.3)

## 2020-10-22 LAB — CBC
HCT: 40.4 % (ref 39.0–52.0)
Hemoglobin: 13.8 g/dL (ref 13.0–17.0)
MCHC: 34.1 g/dL (ref 30.0–36.0)
MCV: 98.2 fl (ref 78.0–100.0)
Platelets: 110 10*3/uL — ABNORMAL LOW (ref 150.0–400.0)
RBC: 4.11 Mil/uL — ABNORMAL LOW (ref 4.22–5.81)
RDW: 18.2 % — ABNORMAL HIGH (ref 11.5–15.5)
WBC: 6.8 10*3/uL (ref 4.0–10.5)

## 2020-10-22 MED ORDER — NA SULFATE-K SULFATE-MG SULF 17.5-3.13-1.6 GM/177ML PO SOLN: 1.0000 | Freq: Once | ORAL | 0 refills | Status: AC

## 2020-10-22 MED ORDER — RIFAXIMIN 550 MG PO TABS: 550.0000 mg | ORAL_TABLET | Freq: Two times a day (BID) | ORAL | 0 refills | Status: DC

## 2020-10-22 NOTE — Progress Notes (Signed)
ASSESSMENT AND PLAN    #65 yo male with recently diagnosed Etoh cirrhosis with portal HTN ( mild splenomegaly, portal hypertensive gastropathy, no ascites).  --No etoh consumption since 07/18/20 --Continue lactulose BID. He is at goal of 2-3 BMs a day. Lactulose tends to make him have cramps / gas. Will try Xifaxan if insurance will cover. Continue Lactulose while trying to get Xifaxan --To get 2nd dose of Hep B vaccination ( [place]) --Liver lesion. MRI. Being followed by Dr. Lyndel Safe. For follow up MRI January 2023.  --AFP 2.27 July 2020  # Iron deficiency anemia secondary to GAVE s/p EGD with RFA / APC July 2022.  Here today with recurrent fatigue despite upward trend of hemoglobin post transfusion and on oral iron.   His sleeping pattern isn't reversed like that which can be seen with cirrhosis. I doubt fatigue secondary to anemia --Obtain basic labs but I suspect he is exhausted from being up urinating throughout the night.  See below  # Nocturia.  Reports being up urinating almost every hour during the night since hospital discharge.  He is not on any diuretics.  Tells me PCP checked his urine which was negative for infection. Query if related to prostate?  --I asked patient to talk with his PCP about ongoing nocturia.  Apparently there was some conversation at some point about starting Flomax  # Iron deficiency anemia due to GAVE Hgb up to 12.5 on 09/19/20 (on oral iron ).  --Patient already scheduled for follow up EGD for possible additional GAVE treatment in November. He will have colonoscopy done the same day for history of colon polyps  # GERD, asymptomatic on BID PPI.    HISTORY OF PRESENT ILLNESS    Chief Complaint : fatigue  Kristopher Ramos is a 65 y.o. male known to Dr. Lyndel Safe  with a past medical history significant for HTN, Etoh cirrhosis, adenomatous colon polyps ( 2016 in [facility]) see PMH below for any additional medical problems.    Kristopher Ramos was  hospitalized in July 2022 with dizziness,  iron deficiency anemia ( hgb ~ 6), markedly abnormal hepatic function panel. No overt GI blood loss, he was Hemoccult negative. Hemoglobin improved after 2 units of blood.  He had been taking NSAIDs which were discontinued.  He was drinking six 12 ounce beers a day.  Abdominal ultrasound remarkable for cirrhosis.  Inpatient EGD remarkable for mild portal hypertensive gastropathy, GAVE s/p APC and gastric polyps.   09/03/20 - hospital follow up. Hemoglobin was up to 10.2 [facility]).  He had subclinical hepatic encephalopathy and was started on BID lactulose.  He was scheduled for repeat EGD for follow-up of GAVE and also colonoscopy for follow-up on his history of colon polyps.  Oral iron was continued. Hep B vaccine recommended. He got first dose a month ago with [place], due now for 2nd dose. Hasn't had any etoh since leaving hospital.   INTERVAL HISTORY:  Patient / wife made this appointment to make sure everything is okay.  He is having excessive fatigue.  No SOB. He has not had any blood in his stool. No abdominal distention of swelling of legs. He is scheduled for EGD and colonoscopy to be done at [facility] in November.  He is having 2-3 bowel movements a day on lactulose but it causes cramps / gas.    Wife notes some mild confusion at home, mainly forgetfulness.  He has had his first dose of  hepatitis B vaccine 1 month ago, due for second.  He is immune to hepatitis A.  He is up urinating about every hour during the night.  He does not take diuretics.  Patient says PCP recently checked urine for an infection and urinalysis was negative.  There was mention of starting Flomax but this was not done.     PREVIOUS ENDOSCOPIC EVALUATIONS / PERTINENT STUDIES:   07/19/20 EGD GAVE. Treated with RFA followed by APC - Mild portal hypertensive gastropathy. - Gastric polyps s/p polypectomy (x 2) Small bowel biopsies were negative. Polyps were  hyperplastic   07/26/20 MRI abd w and wo contrast 07/26/20  -arterially enhancing 7 mm focus in the peripheral right lobe of the liver which demonstrates washout but without definite suspicious features, consistent with a LI-RADS category 3 hepatic lesion  07/30/20 Liver biopsy FINAL MICROSCOPIC DIAGNOSIS:   A. LIVER, NEEDLE CORE BIOPSY:  - Moderate to severely active steatohepatitis (grade 2-3 of 3) with  cirrhosis (stage 4 of 4)    Past Medical History:  Diagnosis Date   Alcohol abuse 07/19/2020   Benign essential hypertension 01/08/2015   Generalized anxiety disorder 07/19/2020   GERD (gastroesophageal reflux disease) 07/19/2020   Mixed hyperlipidemia 09/21/2017    Current Medications, Allergies, Past Surgical History, Family History and Social History were reviewed in [employer] record.   Current Outpatient Medications  Medication Sig Dispense Refill   atorvastatin (LIPITOR) 20 MG tablet Take 20 mg by mouth daily.     citalopram (CELEXA) 20 MG tablet Take 20 mg by mouth daily.     EPINEPHrine 0.3 mg/0.3 mL IJ SOAJ injection Inject 0.3 mg into the muscle as needed for anaphylaxis.     folic acid (FOLVITE) 1 MG tablet Take 1 tablet (1 mg total) by mouth daily. 60 tablet 0   hydrOXYzine (ATARAX/VISTARIL) 10 MG tablet Take 1 tablet (10 mg total) by mouth 3 (three) times daily. 90 tablet 0   lactulose (CHRONULAC) 10 GM/15ML solution Take 30 mLs (20 g total) by mouth 2 (two) times daily. 1800 mL 6   pantoprazole (PROTONIX) 40 MG tablet Take 1 tablet (40 mg total) by mouth 2 (two) times daily. 60 tablet 3   thiamine 100 MG tablet Take 1 tablet (100 mg total) by mouth daily. 30 tablet 0   No current facility-administered medications for this visit.    Review of Systems: No chest pain. No shortness of breath. No urinary complaints.   PHYSICAL EXAM :    Wt Readings from Last 3 Encounters:  10/22/20 192 lb (87.1 kg)  09/03/20 189 lb 8 oz (86 kg)  07/19/20  198 lb (89.8 kg)    BP 120/70   Pulse 73   Ht 5\' 6"  (1.676 m)   Wt 192 lb (87.1 kg)   BMI 30.99 kg/m  Constitutional:  Pleasant male in no acute distress. Psychiatric: Normal mood and affect. Behavior is normal. EENT: Pupils normal.  Conjunctivae are normal. No scleral icterus. Neck supple.  Cardiovascular: Normal rate, regular rhythm. No edema Pulmonary/chest: Effort normal and breath sounds normal. No wheezing, rales or rhonchi. Abdominal: Soft, nondistended, nontender. Bowel sounds active throughout. There are no masses palpable. No hepatomegaly. Neurological: Alert and oriented to person place and time.  No asterixis Skin: Skin is warm and dry. No rashes noted.  Tye Savoy, NP  10/22/2020, 2:29 PM

## 2020-10-22 NOTE — Patient Instructions (Signed)
If you are age 64 or younger, your body mass index should be between 19-25. Your Body mass index is 30.99 kg/m. If this is out of the aformentioned range listed, please consider follow up with your Primary Care Provider.  __________________________________________________________  The [place] [facility] providers would like to encourage you to use [facility] to communicate with providers for non-urgent requests or questions.  Due to long hold times on the telephone, sending your provider a message by [facility] may be a faster and more efficient way to get a response.  Please allow 48 business hours for a response.  Please remember that this is for non-urgent requests.   You have been scheduled for an endoscopy. Please follow written instructions given to you at your visit today. If you use inhalers (even only as needed), please bring them with you on the day of your procedure.  Your provider has requested that you go to the basement level for lab work before leaving today. Press "B" on the elevator. The lab is located at the first door on the left as you exit the elevator.  Use Xifaxan 550 mg 1 tablet twice daily  Continue Lactulose  Contact your Primary care provider to schedule you 2nd Hep B vaccine.  Follow up pending the results of your Endoscopy/Colonoscopy.  Thank you for entrusting me with your care and choosing [facility].  Tye Savoy, NP-C

## 2020-11-13 NOTE — Progress Notes (Signed)
Agree with plan RG 

## 2020-11-28 ENCOUNTER — Encounter (HOSPITAL_COMMUNITY): Payer: Self-pay | Admitting: Gastroenterology

## 2020-12-08 NOTE — Anesthesia Preprocedure Evaluation (Addendum)
Anesthesia Evaluation  Patient identified by MRN, date of birth, ID band Patient awake    Reviewed: Allergy & Precautions, NPO status , Patient's Chart, lab work & pertinent test results  History of Anesthesia Complications Negative for: history of anesthetic complications  Airway Mallampati: II  TM Distance: >3 FB Neck ROM: Full    Dental no notable dental hx.    Pulmonary neg pulmonary ROS,    Pulmonary exam normal        Cardiovascular hypertension, Normal cardiovascular exam     Neuro/Psych Anxiety negative neurological ROS     GI/Hepatic GERD  ,(+) Cirrhosis   Esophageal Varices    ,   Endo/Other  negative endocrine ROS  Renal/GU negative Renal ROS  negative genitourinary   Musculoskeletal negative musculoskeletal ROS (+)   Abdominal   Peds  Hematology   Anesthesia Other Findings Day of surgery medications reviewed with patient.  Reproductive/Obstetrics negative OB ROS                            Anesthesia Physical Anesthesia Plan  ASA: 3  Anesthesia Plan: MAC   Post-op Pain Management: Minimal or no pain anticipated   Induction:   PONV Risk Score and Plan: Treatment may vary due to age or medical condition and Propofol infusion  Airway Management Planned: Natural Airway and Nasal Cannula  Additional Equipment: None  Intra-op Plan:   Post-operative Plan:   Informed Consent: I have reviewed the patients History and Physical, chart, labs and discussed the procedure including the risks, benefits and alternatives for the proposed anesthesia with the patient or authorized representative who has indicated his/her understanding and acceptance.       Plan Discussed with: CRNA  Anesthesia Plan Comments:        Anesthesia Quick Evaluation

## 2020-12-09 ENCOUNTER — Ambulatory Visit (HOSPITAL_COMMUNITY): Payer: 59 | Admitting: Anesthesiology

## 2020-12-09 ENCOUNTER — Ambulatory Visit (HOSPITAL_COMMUNITY)
Admission: RE | Admit: 2020-12-09 | Discharge: 2020-12-09 | Disposition: A | Discharge status: Home / Self Care | Payer: 59 | Attending: Gastroenterology | Admitting: Gastroenterology

## 2020-12-09 ENCOUNTER — Encounter (HOSPITAL_COMMUNITY): Payer: Self-pay | Admitting: Gastroenterology

## 2020-12-09 ENCOUNTER — Other Ambulatory Visit: Payer: Self-pay

## 2020-12-09 ENCOUNTER — Encounter (HOSPITAL_COMMUNITY)
Admission: RE | Disposition: A | Discharge status: Home / Self Care | Payer: Self-pay | Source: Home / Self Care | Attending: Gastroenterology

## 2020-12-09 DIAGNOSIS — K640 First degree hemorrhoids: Secondary | ICD-10-CM | POA: Diagnosis not present

## 2020-12-09 DIAGNOSIS — D123 Benign neoplasm of transverse colon: Secondary | ICD-10-CM

## 2020-12-09 DIAGNOSIS — Z860100 Personal history of colon polyps, unspecified: Secondary | ICD-10-CM

## 2020-12-09 DIAGNOSIS — K219 Gastro-esophageal reflux disease without esophagitis: Secondary | ICD-10-CM | POA: Diagnosis not present

## 2020-12-09 DIAGNOSIS — K31819 Angiodysplasia of stomach and duodenum without bleeding: Secondary | ICD-10-CM | POA: Insufficient documentation

## 2020-12-09 DIAGNOSIS — K3189 Other diseases of stomach and duodenum: Secondary | ICD-10-CM | POA: Insufficient documentation

## 2020-12-09 DIAGNOSIS — F419 Anxiety disorder, unspecified: Secondary | ICD-10-CM | POA: Insufficient documentation

## 2020-12-09 DIAGNOSIS — K317 Polyp of stomach and duodenum: Secondary | ICD-10-CM

## 2020-12-09 DIAGNOSIS — R351 Nocturia: Secondary | ICD-10-CM | POA: Diagnosis not present

## 2020-12-09 DIAGNOSIS — Z1211 Encounter for screening for malignant neoplasm of colon: Secondary | ICD-10-CM | POA: Diagnosis not present

## 2020-12-09 DIAGNOSIS — K5730 Diverticulosis of large intestine without perforation or abscess without bleeding: Secondary | ICD-10-CM | POA: Insufficient documentation

## 2020-12-09 DIAGNOSIS — D509 Iron deficiency anemia, unspecified: Secondary | ICD-10-CM

## 2020-12-09 DIAGNOSIS — K7030 Alcoholic cirrhosis of liver without ascites: Secondary | ICD-10-CM | POA: Diagnosis not present

## 2020-12-09 DIAGNOSIS — K766 Portal hypertension: Secondary | ICD-10-CM | POA: Insufficient documentation

## 2020-12-09 HISTORY — PX: POLYPECTOMY: SHX5525

## 2020-12-09 HISTORY — PX: BIOPSY: SHX5522

## 2020-12-09 HISTORY — PX: COLONOSCOPY WITH PROPOFOL: SHX5780

## 2020-12-09 HISTORY — PX: ESOPHAGOGASTRODUODENOSCOPY (EGD) WITH PROPOFOL: SHX5813

## 2020-12-09 SURGERY — ESOPHAGOGASTRODUODENOSCOPY (EGD) WITH PROPOFOL
Anesthesia: Monitor Anesthesia Care

## 2020-12-09 MED ORDER — PHENYLEPHRINE 40 MCG/ML (10ML) SYRINGE FOR IV PUSH (FOR BLOOD PRESSURE SUPPORT)
PREFILLED_SYRINGE | INTRAVENOUS | Status: DC | PRN
  Administered 2020-12-09: 40 ug via INTRAVENOUS

## 2020-12-09 MED ORDER — LACTATED RINGERS IV SOLN: INTRAVENOUS | Status: DC

## 2020-12-09 MED ORDER — SODIUM CHLORIDE 0.9 % IV SOLN: INTRAVENOUS | Status: DC

## 2020-12-09 MED ORDER — LIDOCAINE 2% (20 MG/ML) 5 ML SYRINGE
INTRAMUSCULAR | Status: DC | PRN
Start: 2020-12-09 — End: 2020-12-09
  Administered 2020-12-09: 100 mg via INTRAVENOUS

## 2020-12-09 MED ORDER — PROPOFOL 10 MG/ML IV BOLUS
INTRAVENOUS | Status: AC
  Filled 2020-12-09: qty 20

## 2020-12-09 MED ORDER — PROPOFOL 1000 MG/100ML IV EMUL
INTRAVENOUS | Status: AC
  Filled 2020-12-09: qty 100

## 2020-12-09 MED ORDER — PROPOFOL 500 MG/50ML IV EMUL
INTRAVENOUS | Status: DC | PRN
  Administered 2020-12-09: 150 ug/kg/min via INTRAVENOUS

## 2020-12-09 SURGICAL SUPPLY — 24 items

## 2020-12-09 NOTE — Op Note (Addendum)
[facility] Patient Name: Kristopher Ramos Procedure Date: 12/09/2020 MRN: [id number] Attending MD: Jackquline Denmark , MD Date of Birth: 24-Dec-1955 CSN: [id number] Age: 65 Admit Type: Inpatient Procedure:                Upper GI endoscopy Indications:              1. 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                           2. 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 Estimated Blood Loss:     Estimated blood loss: none. Procedure:                Pre-Anesthesia Assessment:                           - Prior to the procedure, a History and Physical                            was performed, and patient medications and                            allergies were reviewed. The patient's tolerance of                            previous anesthesia was also reviewed. The risks                            and benefits of the procedure and the sedation                            options and risks were discussed with the patient.                            All questions were answered, and informed consent                            was  obtained. Prior Anticoagulants: The patient has                            taken no previous anticoagulant or antiplatelet                            agents. ASA Grade Assessment: III - A patient with                            severe systemic disease. After reviewing the risks                            and benefits, the patient was deemed in                            satisfactory condition to undergo  the procedure.                           After obtaining informed consent, the endoscope was                            passed under direct vision. Throughout the                            procedure, the patient's blood pressure, pulse, and                            oxygen saturations were monitored continuously. The                            [id number]-[id number] ([id number] Olympus endoscope was introduced                            through the mouth, and advanced to the second part                            of duodenum. The upper GI endoscopy was                            accomplished without difficulty. The patient                            tolerated the procedure well. Scope In: Scope Out: Findings:      The examined esophagus was normal with gd 0-1 minimal esophageal varices       (too small for EVL).      Mild portal hypertensive gastropathy was found in the entire examined       stomach. Biopsies were taken with a cold forceps for histology.      Mild gastric antral vascular ectasia without bleeding was present in the       gastric antrum.      A few 2 to 4 mm sessile polyps with no bleeding and no stigmata of       recent bleeding were found in the gastric body. Noted to be fundic  gland       polyps previously.      The examined duodenum was normal. Impression:               - Portal hypertensive gastropathy. Biopsied.                           - Minimal Gastric antral vascular ectasia without                            bleeding.                           - A few gastric polyps.                           - Normal examined duodenum. Moderate Sedation:      Not Applicable - Patient had care per Anesthesia. Recommendation:           - Patient has a contact number available for                            emergencies. The signs and symptoms of potential                            delayed complications were discussed with the                            patient. Return to normal activities  tomorrow.                            Written discharge instructions were provided to the                            patient.                           - Resume previous diet.                           - Continue present medications.                           - Strictly no alcohol.                           - No aspirin, ibuprofen, naproxen, or other                            non-steroidal anti-inflammatory drugs.                           - The findings and recommendations were discussed                            with the patient's family. Procedure Code(s):        --- Professional ---  11735, Esophagogastroduodenoscopy, flexible,                            transoral; with biopsy, single or multiple Diagnosis Code(s):        --- Professional ---                           K76.6, Portal hypertension                           K31.89, Other diseases of stomach and duodenum                           K31.819, Angiodysplasia of stomach and duodenum                            without bleeding                           K31.7, Polyp of stomach and duodenum                           R10.13, Epigastric pain CPT copyright 2019 American Medical Association. All rights reserved. The codes documented in this report are preliminary and upon coder review may  be revised to meet current compliance requirements. Jackquline Denmark, MD 12/09/2020 8:20:35 AM This report has been signed electronically. Number of Addenda: 0

## 2020-12-09 NOTE — Transfer of Care (Signed)
Immediate Anesthesia Transfer of Care Note  Patient: Kristopher Ramos  Procedure(s) Performed: ESOPHAGOGASTRODUODENOSCOPY (EGD) WITH PROPOFOL COLONOSCOPY WITH PROPOFOL BIOPSY POLYPECTOMY  Patient Location: Endoscopy Unit  Anesthesia Type:MAC  Level of Consciousness: sedated, patient cooperative and responds to stimulation  Airway & Oxygen Therapy: Patient Spontanous Breathing and Patient connected to face mask oxygen  Post-op Assessment: Report given to RN and Post -op Vital signs reviewed and stable  Post vital signs: Reviewed and stable  Last Vitals:  Vitals Value Taken Time  BP 81/47 12/09/20 0816  Temp    Pulse 59 12/09/20 0818  Resp 16 12/09/20 0818  SpO2 100 % 12/09/20 0818  Vitals shown include unvalidated device data.  Last Pain:  Vitals:   12/09/20 0816  TempSrc:   PainSc: Asleep         Complications: No notable events documented.

## 2020-12-09 NOTE — Anesthesia Postprocedure Evaluation (Signed)
Anesthesia Post Note  Patient: Kristopher Ramos  Procedure(s) Performed: ESOPHAGOGASTRODUODENOSCOPY (EGD) WITH PROPOFOL COLONOSCOPY WITH PROPOFOL BIOPSY POLYPECTOMY     Patient location during evaluation: PACU Anesthesia Type: MAC Level of consciousness: awake and alert and oriented Pain management: pain level controlled Vital Signs Assessment: post-procedure vital signs reviewed and stable Respiratory status: spontaneous breathing, nonlabored ventilation and respiratory function stable Cardiovascular status: blood pressure returned to baseline Postop Assessment: no apparent nausea or vomiting Anesthetic complications: no   No notable events documented.  Last Vitals:  Vitals:   12/09/20 0826 12/09/20 0836  BP: 115/60 (!) 114/57  Pulse: 62 61  Resp: 18 18  Temp:    SpO2: 99% 99%    Last Pain:  Vitals:   12/09/20 0836  TempSrc:   PainSc: 0-No pain                 Marthenia Rolling

## 2020-12-09 NOTE — H&P (Signed)
Reviwed  No change ASSESSMENT AND PLAN     #65 yo male with recently diagnosed Etoh cirrhosis with portal HTN ( mild splenomegaly, portal hypertensive gastropathy, no ascites).  --No etoh consumption since 07/18/20 --Continue lactulose BID. He is at goal of 2-3 BMs a day. Lactulose tends to make him have cramps / gas. Will try Xifaxan if insurance will cover. Continue Lactulose while trying to get Xifaxan --To get 2nd dose of Hep B vaccination ( [place]) --Liver lesion. MRI. Being followed by Dr. Lyndel Safe. For follow up MRI January 2023.  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  Here today with recurrent fatigue despite upward trend of hemoglobin post transfusion and on oral iron.   His sleeping pattern isn't reversed like that which can be seen with cirrhosis. I doubt fatigue secondary to anemia --Obtain basic labs but I suspect he is exhausted from being up urinating throughout the night.  See below   # Nocturia.  Reports being up urinating almost every hour during the night since hospital discharge.  He is not on any diuretics.  Tells me PCP checked his urine which was negative for infection. Query if related to prostate?  --I asked patient to talk with his PCP about ongoing nocturia.  Apparently there was some conversation at some point about starting Flomax   # Iron deficiency anemia due to GAVE Hgb up to 12.5 on 09/19/20 (on oral iron ).  --Patient already scheduled for follow up EGD for possible additional GAVE treatment in November. He will have colonoscopy done the same day for history of colon polyps   # GERD, asymptomatic on BID PPI.      HISTORY OF PRESENT ILLNESS     Chief Complaint : fatigue   Kristopher Ramos is a 65 y.o. male known to Dr. Lyndel Safe  with a past medical history significant for HTN, Etoh cirrhosis, adenomatous colon polyps ( 2016 in [facility]) see PMH below for any additional medical problems.      Mr. Leeman was hospitalized in July 2022 with dizziness,  iron deficiency anemia ( hgb ~ 6), markedly abnormal hepatic function panel. No overt GI blood loss, he was Hemoccult negative. Hemoglobin improved after 2 units of blood.  He had been taking NSAIDs which were discontinued.  He was drinking six 12 ounce beers a day.  Abdominal ultrasound remarkable for cirrhosis.  Inpatient EGD remarkable for mild portal hypertensive gastropathy, GAVE s/p APC and gastric polyps.    09/03/20 - hospital follow up. Hemoglobin was up to 10.2 [facility]).  He had subclinical hepatic encephalopathy and was started on BID lactulose.  He was scheduled for repeat EGD for follow-up of GAVE and also colonoscopy for follow-up on his history of colon polyps.  Oral iron was continued. Hep B vaccine recommended. He got first dose a month ago with [place], due now for 2nd dose. Hasn't had any etoh since leaving hospital.    INTERVAL HISTORY:  Patient / wife made this appointment to make sure everything is okay.  He is having excessive fatigue.  No SOB. He has not had any blood in his stool. No abdominal distention of swelling of legs. He is scheduled for EGD and colonoscopy to be done at [facility] in November.  He is having 2-3 bowel movements a day on lactulose but it causes cramps / gas.    Wife  notes some mild confusion at home, mainly forgetfulness.  He has had his first dose of hepatitis B vaccine 1 month ago, due for second.  He is immune to hepatitis A.  He is up urinating about every hour during the night.  He does not take diuretics.  Patient says PCP recently checked urine for an infection and urinalysis was negative.  There was mention of starting Flomax but this was not done.       PREVIOUS ENDOSCOPIC EVALUATIONS / PERTINENT STUDIES:    07/19/20 EGD GAVE. Treated with RFA followed by APC - Mild portal hypertensive gastropathy. - Gastric polyps s/p polypectomy (x 2) Small bowel biopsies were negative.  Polyps were hyperplastic     07/26/20 MRI abd w and wo contrast 07/26/20  -arterially enhancing 7 mm focus in the peripheral right lobe of the liver which demonstrates washout but without definite suspicious features, consistent with a LI-RADS category 3 hepatic lesion   07/30/20 Liver biopsy FINAL MICROSCOPIC DIAGNOSIS:   A. 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         Current Outpatient Medications  Medication Sig Dispense Refill   atorvastatin (LIPITOR) 20 MG tablet Take 20 mg by mouth daily.       citalopram (CELEXA) 20 MG tablet Take 20 mg by mouth daily.       EPINEPHrine 0.3 mg/0.3 mL IJ SOAJ injection Inject 0.3 mg into the muscle as needed for anaphylaxis.       folic acid (FOLVITE) 1 MG tablet Take 1 tablet (1 mg total) by mouth daily. 60 tablet 0   hydrOXYzine (ATARAX/VISTARIL) 10 MG tablet Take 1 tablet (10 mg total) by mouth 3 (three) times daily. 90 tablet 0   lactulose (CHRONULAC) 10 GM/15ML solution Take 30 mLs (20 g total) by mouth 2 (two) times daily. 1800 mL 6   pantoprazole (PROTONIX) 40 MG tablet Take 1 tablet (40 mg total) by mouth 2 (two) times daily. 60 tablet 3   thiamine 100 MG tablet Take 1 tablet (100 mg total) by mouth daily. 30 tablet 0    No current facility-administered medications for this visit.      Review of Systems: No chest pain. No shortness of breath. No urinary complaints.    PHYSICAL EXAM :        Wt Readings from Last 3 Encounters:  10/22/20 192 lb (87.1  kg)  09/03/20 189 lb 8 oz (86 kg)  07/19/20 198 lb (89.8 kg)      BP 120/70   Pulse 73   Ht 5\' 6"  (1.676 m)   Wt 192 lb (87.1 kg)   BMI 30.99 kg/m  Constitutional:  Pleasant male in no acute distress. Psychiatric: Normal mood and affect. Behavior is normal. EENT: Pupils normal.  Conjunctivae are normal. No scleral icterus. Neck supple.  Cardiovascular: Normal rate, regular rhythm. No edema Pulmonary/chest: Effort normal and breath sounds normal. No wheezing, rales or rhonchi. Abdominal: Soft, nondistended, nontender. Bowel sounds active throughout. There are no masses palpable.  No hepatomegaly. Neurological: Alert and oriented to person place and time.  No asterixis Skin: Skin is warm and dry. No rashes noted.  Arelia Sneddon

## 2020-12-09 NOTE — Discharge Instructions (Signed)
YOU HAD AN ENDOSCOPIC PROCEDURE TODAY: Refer to the procedure report and other information in the discharge instructions given to you for any specific questions about what was found during the examination. If this information does not answer your questions, please call  office at [phone] to clarify.  ° °YOU SHOULD EXPECT: Some feelings of bloating in the abdomen. Passage of more gas than usual. Walking can help get rid of the air that was put into your GI tract during the procedure and reduce the bloating. If you had a lower endoscopy (such as a colonoscopy or flexible sigmoidoscopy) you may notice spotting of blood in your stool or on the toilet paper. Some abdominal soreness may be present for a day or two, also. ° °DIET: Your first meal following the procedure should be a light meal and then it is ok to progress to your normal diet. A half-sandwich or bowl of soup is an example of a good first meal. Heavy or fried foods are harder to digest and may make you feel nauseous or bloated. Drink plenty of fluids but you should avoid alcoholic beverages for 24 hours.  ° °ACTIVITY: Your care partner should take you home directly after the procedure. You should plan to take it easy, moving slowly for the rest of the day. You can resume normal activity the day after the procedure however YOU SHOULD NOT DRIVE, use power tools, machinery or perform tasks that involve climbing or major physical exertion for 24 hours (because of the sedation medicines used during the test).  ° °SYMPTOMS TO REPORT IMMEDIATELY: °A gastroenterologist can be reached at any hour. Please call [phone]  for any of the following symptoms:  °Following lower endoscopy (colonoscopy, flexible sigmoidoscopy) °Excessive amounts of blood in the stool  °Significant tenderness, worsening of abdominal pains  °Swelling of the abdomen that is new, acute  °Fever of 100° or higher  °Following upper endoscopy (EGD, EUS, ERCP, esophageal  dilation) °Vomiting of blood or coffee ground material  °New, significant abdominal pain  °New, significant chest pain or pain under the shoulder blades  °Painful or persistently difficult swallowing  °New shortness of breath  °Black, tarry-looking or red, bloody stools ° °FOLLOW UP:  °If any biopsies were taken you will be contacted by phone or by letter within the next 1-3 weeks. Call [phone]  if you have not heard about the biopsies in 3 weeks.  °Please also call with any specific questions about appointments or follow up tests.  °

## 2020-12-09 NOTE — Op Note (Signed)
[facility] Patient Name: Kristopher Ramos Procedure Date: 12/09/2020 MRN: [id number] Attending MD: Jackquline Denmark , MD Date of Birth: 15-Jun-1955 CSN: [id number] Age: 65 Admit Type: Inpatient Procedure:                Colonoscopy Indications:              High risk colon cancer surveillance: Personal                            history of colonic polyps Providers:                Jackquline Denmark, MD, Elmer Ramp. Tilden Dome, RN, Frazier Richards, Technician Referring MD:              Medicines:                Monitored Anesthesia Care Complications:            No immediate complications. Estimated Blood Loss:     Estimated blood loss: none. Procedure:                Pre-Anesthesia Assessment:                           - Prior to the procedure, a History and Physical                            was performed, and patient medications and                            allergies were reviewed. The patient's tolerance of                            previous anesthesia was also reviewed. The risks                            and benefits of the procedure and the sedation                            options and risks were discussed with the patient.                            All questions were answered, and informed consent                            was obtained. Prior Anticoagulants: The patient has                            taken no previous anticoagulant or antiplatelet                            agents. ASA Grade Assessment: III - A patient with                            severe  systemic disease. After reviewing the risks                            and benefits, the patient was deemed in                            satisfactory condition to undergo the procedure.                           After obtaining informed consent, the colonoscope                            was passed under direct vision. Throughout the                            procedure, the patient's blood  pressure, pulse, and                            oxygen saturations were monitored continuously. The                            [id number]-[id number] ([id number] Olympus colonoscope was                            introduced through the anus and advanced to the the                            cecum, identified by appendiceal orifice and                            ileocecal valve. The colonoscopy was performed                            without difficulty. The patient tolerated the                            procedure well. The quality of the bowel                            preparation was adequate to identify polyps 6 mm                            and larger in size. There was retained solid stool                            especially in the right side of the colon making it                            somewhat harder to visualize. Aggressive suctioning                            and aspiration was performed. Overall 85 to 90% of  the colonic mucosa was visualized satisfactorily.                            Of note the small and flat lesions could have been                            missed. The ileocecal valve, appendiceal orifice,                            and rectum were photographed. Scope In: 7:57:04 AM Scope Out: 8:09:58 AM Scope Withdrawal Time: 0 hours 9 minutes 46 seconds  Total Procedure Duration: 0 hours 12 minutes 54 seconds  Findings:      Two sessile polyps were found in the mid transverse colon and distal       transverse colon. The polyps were 4 to 6 mm in size. These polyps were       removed with a cold snare. Resection and retrieval were complete.      A few medium-mouthed diverticula were found in the sigmoid colon,       ascending colon and cecum.      Non-bleeding internal hemorrhoids were found during retroflexion. The       hemorrhoids were moderate and Grade I (internal hemorrhoids that do not       prolapse).      The exam was otherwise without  abnormality on direct and retroflexion       views. Impression:               - Two 4 to 6 mm polyps in the mid transverse colon                            and in the distal transverse colon, removed with a                            cold snare. Resected and retrieved.                           - Mild pancolonic diverticulosis predominantly in                            the right colon.                           - Non-bleeding internal hemorrhoids.                           - The examination was otherwise normal on direct                            and retroflexion views. Moderate Sedation:      Not Applicable - Patient had care per Anesthesia. Recommendation:           - Patient has a contact number available for                            emergencies. The signs and symptoms of potential  delayed complications were discussed with the                            patient. Return to normal activities tomorrow.                            Written discharge instructions were provided to the                            patient.                           - Resume previous diet.                           - Continue present medications.                           - Await pathology results.                           - Repeat colonoscopy for surveillance based on                            pathology results. Likely at an earlier interval                            d/t quality of preparation.                           - The findings and recommendations were discussed                            with the patient's family. Procedure Code(s):        --- Professional ---                           [phone], Colonoscopy, flexible; with removal of                            tumor(s), polyp(s), or other lesion(s) by snare                            technique Diagnosis Code(s):        --- Professional ---                           Z86.010, Personal history of colonic polyps                            K63.5, Polyp of colon                           K64.0, First degree hemorrhoids                           K57.30, Diverticulosis of large intestine without  perforation or abscess without bleeding CPT copyright 2019 American Medical Association. All rights reserved. The codes documented in this report are preliminary and upon coder review may  be revised to meet current compliance requirements. Jackquline Denmark, MD 12/09/2020 8:24:35 AM This report has been signed electronically. Number of Addenda: 0

## 2020-12-10 LAB — SURGICAL PATHOLOGY

## 2020-12-11 ENCOUNTER — Encounter (HOSPITAL_COMMUNITY): Payer: Self-pay | Admitting: Gastroenterology

## 2020-12-15 ENCOUNTER — Encounter: Payer: Self-pay | Admitting: Gastroenterology

## 2021-01-03 ENCOUNTER — Other Ambulatory Visit: Payer: Self-pay | Admitting: Nurse Practitioner

## 2021-01-15 ENCOUNTER — Other Ambulatory Visit: Payer: Self-pay

## 2021-01-15 ENCOUNTER — Telehealth: Payer: Self-pay | Admitting: Nurse Practitioner

## 2021-01-15 MED ORDER — LACTULOSE 10 GM/15ML PO SOLN: 20.0000 g | Freq: Two times a day (BID) | ORAL | 6 refills | Status: DC

## 2021-01-15 NOTE — Telephone Encounter (Signed)
Called and left message for patient's wife that, according to [facility], patient should "Continue lactulose if tolerated. If causes too much bloating or excessive diarrhea (more than 3 BMs a day) then can stop it".  Refill for lactulose and Xifaxan have been sent to [facility] in [place]. She can have those transferred where they are in [place] if they need to.

## 2021-01-15 NOTE — Progress Notes (Signed)
Called and left message for patient's wife that according to [facility] patient should "Continue lactulose if tolerated. If causes too much bloating or excessive diarrhea (more than 3 BMs a day) then can stop it".  Refill for lactulose and Xifaxan have been sent to [facility] in [place]. She can have those transferred where they are in [place] if they need to.

## 2021-01-15 NOTE — Telephone Encounter (Signed)
Patients wife calling in regards to a medication refill. Stated that they are in [place] and he doesn't know what medication he is out of but it is a solution. I named the medications and wife is unsure.  Xifaxan was order today and wife is requesting it be resent to:  [address] [place], [address]  [phone]  Please advise.

## 2021-01-29 ENCOUNTER — Other Ambulatory Visit: Payer: Self-pay

## 2021-01-29 ENCOUNTER — Other Ambulatory Visit: Payer: Self-pay | Admitting: Nurse Practitioner

## 2021-01-29 MED ORDER — RIFAXIMIN 550 MG PO TABS: 550.0000 mg | ORAL_TABLET | Freq: Two times a day (BID) | ORAL | 5 refills | Status: DC

## 2021-02-05 ENCOUNTER — Encounter: Payer: Self-pay | Admitting: Gastroenterology

## 2021-03-03 ENCOUNTER — Other Ambulatory Visit: Payer: Self-pay | Admitting: Nurse Practitioner

## 2021-03-03 ENCOUNTER — Other Ambulatory Visit: Payer: Self-pay

## 2021-03-03 ENCOUNTER — Ambulatory Visit (INDEPENDENT_AMBULATORY_CARE_PROVIDER_SITE_OTHER): Payer: 59 | Admitting: Gastroenterology

## 2021-03-03 ENCOUNTER — Encounter: Payer: Self-pay | Admitting: Gastroenterology

## 2021-03-03 VITALS — BP 130/78 | HR 73 | Ht 66.0 in | Wt 202.4 lb

## 2021-03-03 DIAGNOSIS — K7682 Hepatic encephalopathy: Secondary | ICD-10-CM

## 2021-03-03 DIAGNOSIS — D509 Iron deficiency anemia, unspecified: Secondary | ICD-10-CM

## 2021-03-03 DIAGNOSIS — K7030 Alcoholic cirrhosis of liver without ascites: Secondary | ICD-10-CM | POA: Diagnosis not present

## 2021-03-03 DIAGNOSIS — D369 Benign neoplasm, unspecified site: Secondary | ICD-10-CM

## 2021-03-03 MED ORDER — RIFAXIMIN 550 MG PO TABS: 550.0000 mg | ORAL_TABLET | Freq: Two times a day (BID) | ORAL | 5 refills | Status: DC

## 2021-03-03 NOTE — Patient Instructions (Addendum)
If you are age 65 or older, your body mass index should be between 23-30. Your Body mass index is 32.66 kg/m. If this is out of the aforementioned range listed, please consider follow up with your Primary Care Provider.  If you are age 14 or younger, your body mass index should be between 19-25. Your Body mass index is 32.66 kg/m. If this is out of the aformentioned range listed, please consider follow up with your Primary Care Provider.   __________________________________________________________  The [place] [facility] providers would like to encourage you to use [facility] to communicate with providers for non-urgent requests or questions.  Due to long hold times on the telephone, sending your provider a message by [facility] may be a faster and more efficient way to get a response.  Please allow 48 business hours for a response.  Please remember that this is for non-urgent requests.   We will contact you with a date and time for your MRI once it is authorized.   Please go to the lab on the 2nd floor [address] here at [place] [facility] with your Iron supplement twice daily  We have sent the following medications to your pharmacy for you to pick up at your convenience: Xifaxin 550mg  twice a day

## 2021-03-03 NOTE — Progress Notes (Signed)
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  No ascites.   Subclinical hepatic encephalopathy. Didn't tolerate lactulose d/t abdominal cramps.  Small liver lesion on MRI 07/2020. LI-RADS category 3 hepatic lesion. 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  Hb 6.6 on Adm s/p 2U to 8.4. Most recent Hb 13.26 Oct 2020.  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 Next due   Plan: -CMP, CBC, PT INR, AFP, B12 -MRI liver with contrast. R/O HCC. -Continue iron BID -Continue rifaxamin 550mg  po BID. Pt assistance program/drug rep.  -D/W pt and pt's wife extensively.   HPI:    Kristopher Ramos is a 66 y.o. male  Adm to [facility] with new Dx of acute hepatitis on chronic EtOH cirrhosis 07/2020 (s/p liver bx as below).  He was found to have microcytic anemia with heme-negative stools. Hb 6.6 s/p 2U to 8.4.  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 Rpt EGD 11/2020 with significant resolution.  FU as he got letter for follow-up MRI.  Previous MRI showed small LI RADS category 3 lesion.  No nausea, vomiting, heartburn, regurgitation, odynophagia or dysphagia.  No significant diarrhea or constipation.  No melena or hematochezia. No unintentional weight loss. No abdominal pain.  Most recent hemoglobin was normal.  Platelets continue to be low.  Follow-up visit from above hospitalization.  Does not want to go back on lactulose.  He is having problems getting rifaximin-gets 1 week at a time.  Not getting overtly confused.  Has nocturia, negative UA.  Will talk to PCP regarding appointment with urology.  Note that he is not on any diuretics.  Has stopped drinking all alcohol.  Last drink 07/19/2020.  Has stopped nonsteroidals as well.  Wt Readings from Last 3 Encounters:  03/03/21 202 lb 6 oz (91.8 kg)  12/09/20 192 lb 0.3 oz (87.1 kg)  10/22/20 192 lb (87.1 kg)     Previous  GI/liver work-up:  Last EtOH use 07/19/2020.  Liver bx 07/30/2020 -Moderate to severely active steatohepatitis (grade 2-3 of 3) with  cirrhosis (stage 4 of 4).   -neg HSV 1/2 -neg acute hep  -non reactive HBsAb 07/2020 -AFP 2/7 07/2020 -neg A1AT, ANA, AMA  EGD 07/19/2020 - GAVE. Treated with RFA followed by APC - Mild portal hypertensive gastropathy. - Gastric polyps s/p polypectomy (x 2) - No EV  EGD 12/09/2020: - Portal hypertensive gastropathy. Biopsied. - Minimal Gastric antral vascular ectasia without bleeding. No need for rpt RFA. - A few gastric polyps.   Colon 12/09/2020 - Two 4 to 6 mm polyps in the mid transverse colon and in the distal transverse colon, removed with a cold snare. Resected and retrieved. - Mild pancolonic diverticulosis predominantly in the right colon. - Non-bleeding internal hemorrhoids. - The examination was otherwise normal on direct and retroflexion views. - Rpt in 3 yrs with 2 day prep d/t prev H/O polyps, quality of prep.  [place] 07/19/2020 Cirrhotic change of the liver. No focal intrahepatic mass identified. No ascites.   CT AP 07/2020 1. Cirrhotic liver morphology with an area of subtle heterogeneous enhancement towards the dome. MRI abdomen with and without contrast recommended to further evaluate. 2. No intra or extrahepatic biliary duct dilatation. 3. No overt findings of portal venous hypertension.  MRI 07/2020 1. Heterogeneous signal intensity hepatic dome, corresponding with the finding on prior  CT is most consistent with fibrosis. 2. Cirrhotic hepatic morphology with mild splenomegaly suggestive portal hypertension. No ascites. 3. Arterially enhancing 7 mm focus in the peripheral right lobe of the liver which demonstrates washout but without definite suspicious features, consistent with a LI-RADS category 3 hepatic lesion.  Colon - Two 4 to 6 mm polyps in the mid transverse colon and in the distal transverse colon, removed with a  cold snare. Resected and retrieved. - Mild pancolonic diverticulosis predominantly in the right colon. - Non-bleeding internal hemorrhoids. - The examination was otherwise normal on direct and retroflexion views. Past Medical History:  Diagnosis Date   Alcohol abuse 07/19/2020   Benign essential hypertension 01/08/2015   Generalized anxiety disorder 07/19/2020   GERD (gastroesophageal reflux disease) 07/19/2020   Mixed hyperlipidemia 09/21/2017    Past Surgical History:  Procedure Laterality Date   BIOPSY  07/19/2020   Procedure: BIOPSY;  Surgeon: Jackquline Denmark, MD;  Location: [facility] ENDOSCOPY;  Service: Endoscopy;;   BIOPSY  12/09/2020   Procedure: BIOPSY;  Surgeon: Jackquline Denmark, MD;  Location: [facility] ENDOSCOPY;  Service: Endoscopy;;   COLONOSCOPY WITH PROPOFOL N/A 12/09/2020   Procedure: COLONOSCOPY WITH PROPOFOL;  Surgeon: Jackquline Denmark, MD;  Location: [facility] ENDOSCOPY;  Service: Endoscopy;  Laterality: N/A;   ESOPHAGOGASTRODUODENOSCOPY (EGD) WITH PROPOFOL N/A 07/19/2020   Procedure: ESOPHAGOGASTRODUODENOSCOPY (EGD) WITH PROPOFOL;  Surgeon: Jackquline Denmark, MD;  Location: [facility] ENDOSCOPY;  Service: Endoscopy;  Laterality: N/A;   ESOPHAGOGASTRODUODENOSCOPY (EGD) WITH PROPOFOL N/A 12/09/2020   Procedure: ESOPHAGOGASTRODUODENOSCOPY (EGD) WITH PROPOFOL;  Surgeon: Jackquline Denmark, MD;  Location: [facility] ENDOSCOPY;  Service: Endoscopy;  Laterality: N/A;   GI RADIOFREQUENCY ABLATION  07/19/2020   Procedure: GI RADIOFREQUENCY ABLATION;  Surgeon: Jackquline Denmark, MD;  Location: [facility] ENDOSCOPY;  Service: Endoscopy;;   POLYPECTOMY  07/19/2020   Procedure: POLYPECTOMY;  Surgeon: Jackquline Denmark, MD;  Location: [facility] ENDOSCOPY;  Service: Endoscopy;;   POLYPECTOMY  12/09/2020   Procedure: POLYPECTOMY;  Surgeon: Jackquline Denmark, MD;  Location: [facility] ENDOSCOPY;  Service: Endoscopy;;    Family History  Problem Relation Age of Onset   High blood pressure Mother    Dementia Mother    Ulcerative colitis Father    Stomach cancer Neg Hx     Colon cancer Neg Hx    Pancreatic cancer Neg Hx     Social History   Tobacco Use   Smoking status: Never   Smokeless tobacco: Current    Types: Snuff  Vaping Use   Vaping Use: Never used  Substance Use Topics   Alcohol use: Not Currently    Current Outpatient Medications  Medication Sig Dispense Refill   citalopram (CELEXA) 40 MG tablet Take 40 mg by mouth daily.     EPINEPHrine 0.3 mg/0.3 mL IJ SOAJ injection Inject 0.3 mg into the muscle as needed for anaphylaxis.     ferrous sulfate 325 (65 FE) MG tablet Take 325 mg by mouth 2 (two) times daily.     folic acid (FOLVITE) 1 MG tablet Take 1 tablet (1 mg total) by mouth daily. 60 tablet 0   hydrOXYzine (ATARAX/VISTARIL) 10 MG tablet Take 1 tablet (10 mg total) by mouth 3 (three) times daily. (Patient taking differently: Take 10 mg by mouth daily.) 90 tablet 0   pantoprazole (PROTONIX) 40 MG tablet Take 1 tablet (40 mg total) by mouth 2 (two) times daily. 60 tablet 3   thiamine 100 MG tablet Take 1 tablet (100 mg total) by mouth daily. 30 tablet 0   XIFAXAN 550 MG  TABS tablet TAKE 1 TABLET BY MOUTH 2 TIMES DAILY. 60 tablet 5   No current facility-administered medications for this visit.    Allergies  Allergen Reactions   Yellow Jacket Venom Anaphylaxis and Shortness Of Breath    Review of Systems:  neg     Physical Exam:    BP 130/78    Pulse 73    Ht 5\' 6"  (1.676 m)    Wt 202 lb 6 oz (91.8 kg)    SpO2 96%    BMI 32.66 kg/m  Wt Readings from Last 3 Encounters:  03/03/21 202 lb 6 oz (91.8 kg)  12/09/20 192 lb 0.3 oz (87.1 kg)  10/22/20 192 lb (87.1 kg)   Constitutional:  Well-developed, in no acute distress. Psychiatric: Normal mood and affect. Behavior is normal. HEENT: Pupils normal.  Conjunctivae are normal. No scleral icterus. Cardiovascular: Normal rate, regular rhythm. No edema Pulmonary/chest: Effort normal and breath sounds normal. No wheezing, rales or rhonchi. Abdominal: Soft, nondistended. Nontender.  Bowel sounds active throughout. There are no masses palpable. No hepatomegaly. Rectal: Deferred Neurological: Alert and oriented to person place and time. Skin: Skin is warm and dry. No rashes noted.  Data Reviewed: I have personally reviewed following labs and imaging studies  CBC: CBC Latest Ref Rng & Units 10/22/2020 08/02/2020 07/29/2020  WBC 4.0 - 10.5 K/uL 6.8 5.6 6.2  Hemoglobin 13.0 - 17.0 g/dL 13.8 9.6(L) 8.7(L)  Hematocrit 39.0 - 52.0 % 40.4 29.9(L) 26.8(L)  Platelets 150.0 - 400.0 K/uL 110.0(L) 215.0 201    CMP: CMP Latest Ref Rng & Units 10/22/2020 09/03/2020 08/02/2020  Glucose 70 - 99 mg/dL 89 161(H) 152(H)  BUN 6 - 23 mg/dL 10 7 9   Creatinine 0.40 - 1.50 mg/dL 0.75 0.76 0.88  Sodium 135 - 145 mEq/L 135 136 133(L)  Potassium 3.5 - 5.1 mEq/L 4.1 4.1 3.7  Chloride 96 - 112 mEq/L 101 103 103  CO2 19 - 32 mEq/L 26 26 21   Calcium 8.4 - 10.5 mg/dL 9.7 9.1 8.5  Total Protein 6.0 - 8.3 g/dL 7.2 6.7 6.4  Total Bilirubin 0.2 - 1.2 mg/dL 1.4(H) 2.1(H) 5.2(H)  Alkaline Phos 39 - 117 U/L 144(H) 163(H) 171(H)  AST 0 - 37 U/L 48(H) 52(H) 208(H)  ALT 0 - 53 U/L 26 32 174(H)        Carmell Austria, MD 03/03/2021, 4:22 PM  Cc: Kristopher Glee., MD

## 2021-03-05 ENCOUNTER — Other Ambulatory Visit (INDEPENDENT_AMBULATORY_CARE_PROVIDER_SITE_OTHER): Payer: 59

## 2021-03-05 DIAGNOSIS — K7682 Hepatic encephalopathy: Secondary | ICD-10-CM | POA: Diagnosis not present

## 2021-03-05 DIAGNOSIS — D369 Benign neoplasm, unspecified site: Secondary | ICD-10-CM | POA: Diagnosis not present

## 2021-03-05 DIAGNOSIS — D509 Iron deficiency anemia, unspecified: Secondary | ICD-10-CM | POA: Diagnosis not present

## 2021-03-05 DIAGNOSIS — K7030 Alcoholic cirrhosis of liver without ascites: Secondary | ICD-10-CM | POA: Diagnosis not present

## 2021-03-05 LAB — COMPREHENSIVE METABOLIC PANEL
ALT: 31 U/L (ref 0–53)
AST: 55 U/L — ABNORMAL HIGH (ref 0–37)
Albumin: 3.7 g/dL (ref 3.5–5.2)
Alkaline Phosphatase: 196 U/L — ABNORMAL HIGH (ref 39–117)
BUN: 10 mg/dL (ref 6–23)
CO2: 25 mEq/L (ref 19–32)
Calcium: 9.1 mg/dL (ref 8.4–10.5)
Chloride: 105 mEq/L (ref 96–112)
Creatinine, Ser: 1 mg/dL (ref 0.40–1.50)
GFR: 79.73 mL/min (ref >60.00)
Glucose, Bld: 139 mg/dL — ABNORMAL HIGH (ref 70–99)
Potassium: 3.9 mEq/L (ref 3.5–5.1)
Sodium: 137 mEq/L (ref 135–145)
Total Bilirubin: 1.9 mg/dL — ABNORMAL HIGH (ref 0.2–1.2)
Total Protein: 7.7 g/dL (ref 6.0–8.3)

## 2021-03-05 LAB — PROTIME-INR
INR: 1.2 ratio — ABNORMAL HIGH (ref 0.8–1.0)
Prothrombin Time: 12.7 s (ref 9.6–13.1)

## 2021-03-05 LAB — CBC
HCT: 40.7 % (ref 39.0–52.0)
Hemoglobin: 13.9 g/dL (ref 13.0–17.0)
MCHC: 34.2 g/dL (ref 30.0–36.0)
MCV: 103.7 fl — ABNORMAL HIGH (ref 78.0–100.0)
Platelets: 97 10*3/uL — ABNORMAL LOW (ref 150.0–400.0)
RBC: 3.93 Mil/uL — ABNORMAL LOW (ref 4.22–5.81)
RDW: 14.7 % (ref 11.5–15.5)
WBC: 4.9 10*3/uL (ref 4.0–10.5)

## 2021-03-05 LAB — VITAMIN B12: Vitamin B-12: 652 pg/mL (ref 211–911)

## 2021-03-06 LAB — AFP TUMOR MARKER: AFP-Tumor Marker: 8.1 ng/mL — ABNORMAL HIGH (ref <6.1)

## 2021-03-17 ENCOUNTER — Encounter: Payer: Self-pay | Admitting: Gastroenterology

## 2021-03-18 ENCOUNTER — Other Ambulatory Visit: Payer: Self-pay

## 2021-03-18 DIAGNOSIS — R772 Abnormality of alphafetoprotein: Secondary | ICD-10-CM

## 2021-03-18 DIAGNOSIS — K769 Liver disease, unspecified: Secondary | ICD-10-CM

## 2021-03-24 ENCOUNTER — Ambulatory Visit (HOSPITAL_COMMUNITY): Admission: RE | Admit: 2021-03-24 | Payer: 59 | Source: Ambulatory Visit

## 2021-03-24 ENCOUNTER — Encounter (HOSPITAL_COMMUNITY): Payer: Self-pay

## 2021-04-14 ENCOUNTER — Ambulatory Visit (HOSPITAL_COMMUNITY)
Admission: RE | Admit: 2021-04-14 | Discharge: 2021-04-14 | Disposition: A | Discharge status: Home / Self Care | Payer: 59 | Source: Ambulatory Visit | Attending: Gastroenterology | Admitting: Gastroenterology

## 2021-04-14 ENCOUNTER — Other Ambulatory Visit: Payer: Self-pay

## 2021-04-14 DIAGNOSIS — K769 Liver disease, unspecified: Secondary | ICD-10-CM | POA: Insufficient documentation

## 2021-04-14 DIAGNOSIS — R772 Abnormality of alphafetoprotein: Secondary | ICD-10-CM | POA: Diagnosis present

## 2021-04-14 IMAGING — MR MR ABDOMEN WO/W CM
11 of 20 series · 21 of 48 positions shown · IV contrast (gadavist)
Comparison: Comparison is made with [DATE].

CLINICAL DATA: A 65-year-old male presents with liver lesions on
previous MRI, [REDACTED] the elevation and history of cirrhosis.

EXAM:
MRI ABDOMEN WITHOUT AND WITH CONTRAST
TECHNIQUE: Multiplanar multisequence MR imaging of the abdomen was performed
both before and after the administration of intravenous contrast.
CONTRAST:  9mL GADAVIST GADOBUTROL 1 MMOL/ML IV SOLN

[Series 3: cor ssfse nav · coronal · 6.0mm · 0.78mm/px · 1 of 44 slices shown]
[im 1/44]
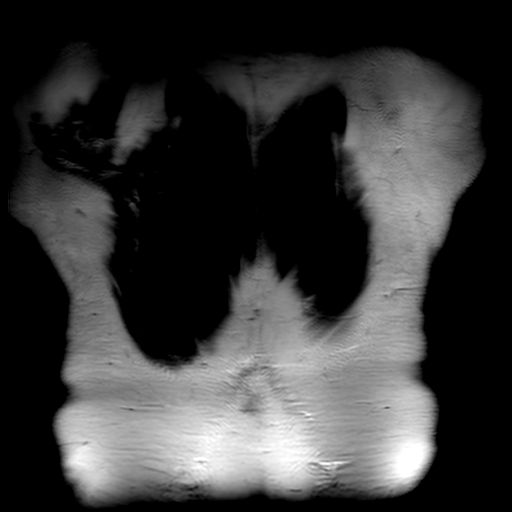

[Series 4: ax ssfse nav · axial · 6.0mm · 0.74mm/px · 1 of 37 slices shown]
[im 1/37]
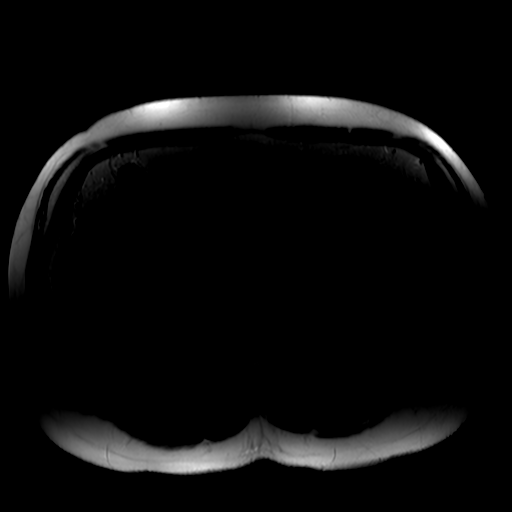

[Series 5: DWI b500 · axial · 8.0mm · 1.48mm/px · 1 of 44 slices shown]
[im 1/44]
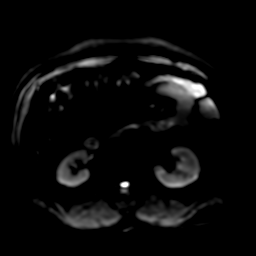

[Series 6: T2 fat-sat · axial · 6.0mm · 0.74mm/px · 1 of 34 slices shown (1 of 2)]
[im 1/34]
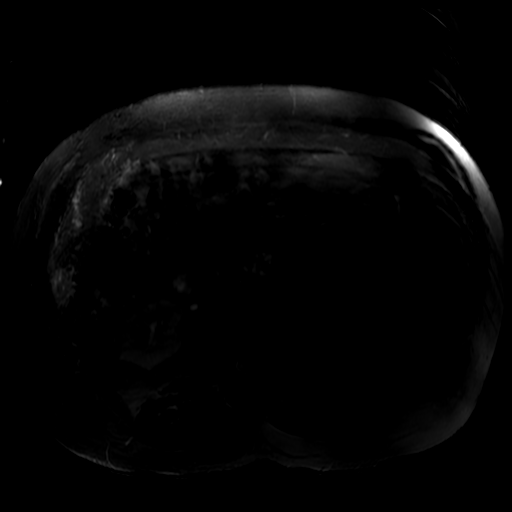

[Series 8: T2 fat-sat · axial · 6.0mm · 0.74mm/px · 1 of 34 slices shown (2 of 2)]
[im 1/34]
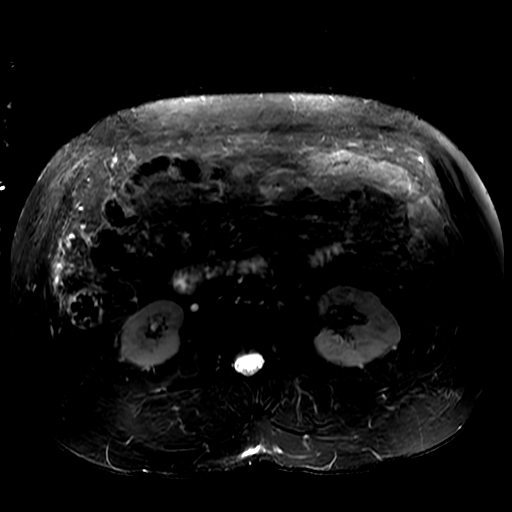

[Series 10: T1 dynamic · coronal · 3.3mm · 1.56mm/px · 3 of 144 slices shown]
[im 1/144]
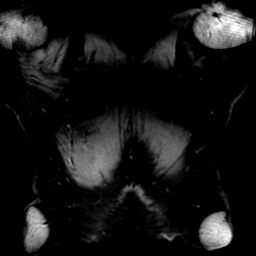
[im 72/144]
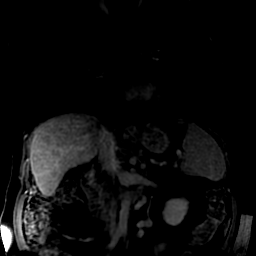
[im 144/144]
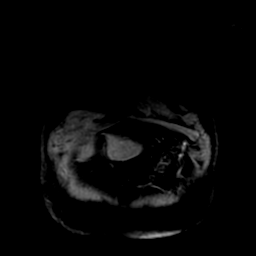

[Series 550: ADC · axial · 8.0mm · 1.48mm/px · 1 of 22 slices shown]
[im 1/22]
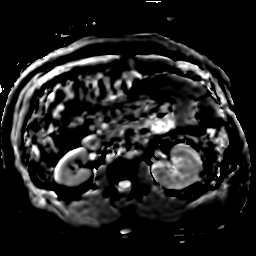

[Series 900: T1 dynamic post-contrast · axial · non-contrast · 3.9mm · 0.82mm/px · z∈[-9,+269]mm · 3 of 140 slices shown (1 of 4)]
[im 1/140]
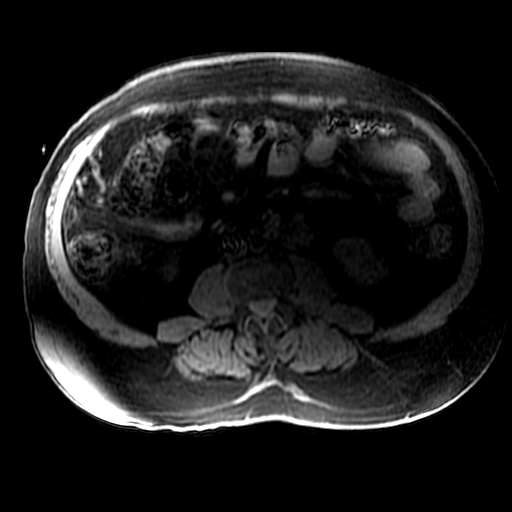
[im 70/140]
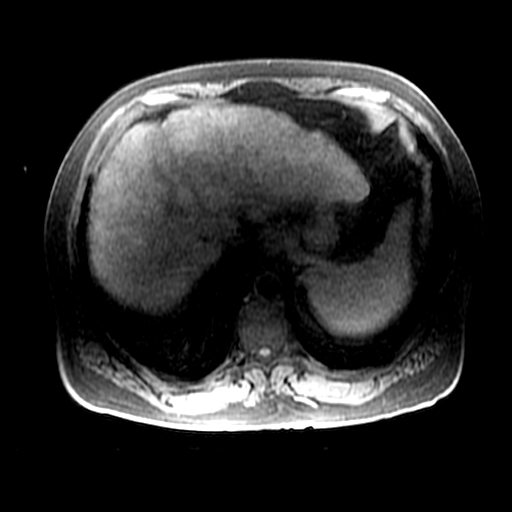
[im 140/140]
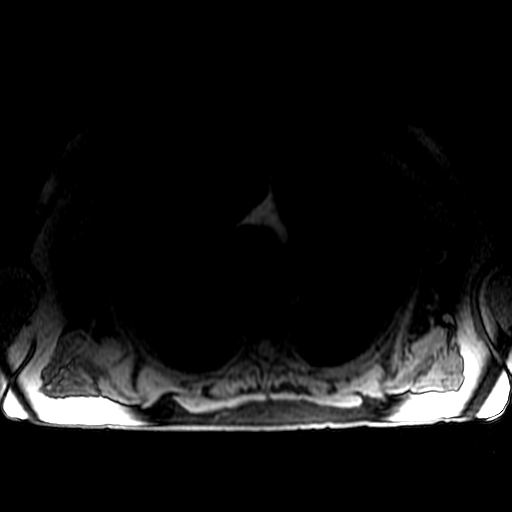

[Series 901: T1 dynamic post-contrast · axial · non-contrast · 3.9mm · 0.82mm/px · z∈[-9,+269]mm · 3 of 140 slices shown (2 of 4)]
[im 1/140]
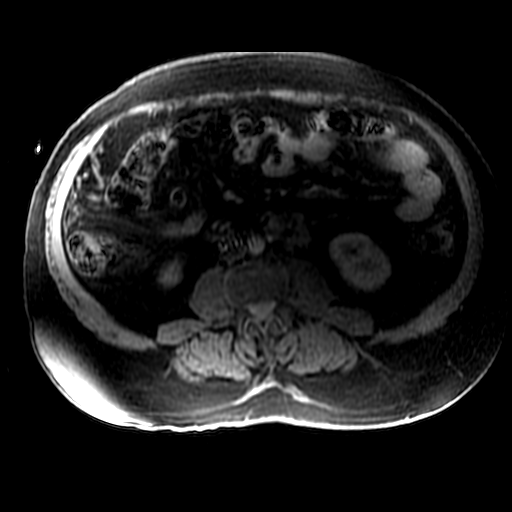
[im 70/140]
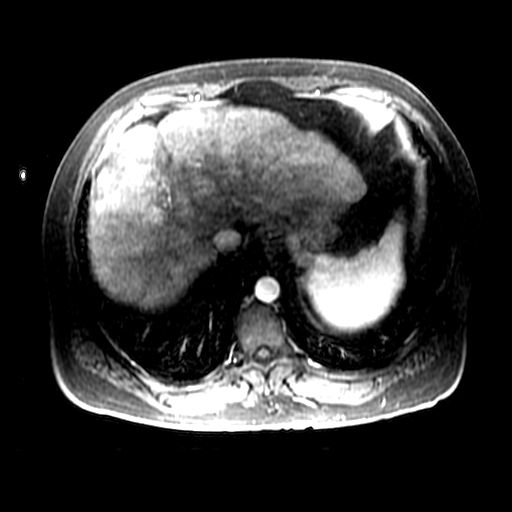
[im 140/140]
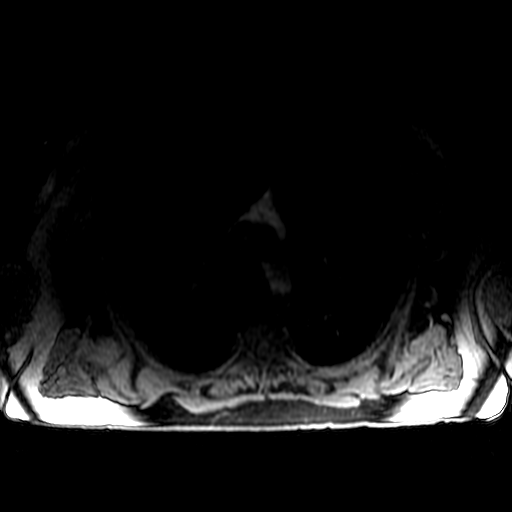

[Series 902: T1 dynamic post-contrast · axial · non-contrast · 3.9mm · 0.82mm/px · z∈[-9,+269]mm · 3 of 140 slices shown (3 of 4)]
[im 1/140]
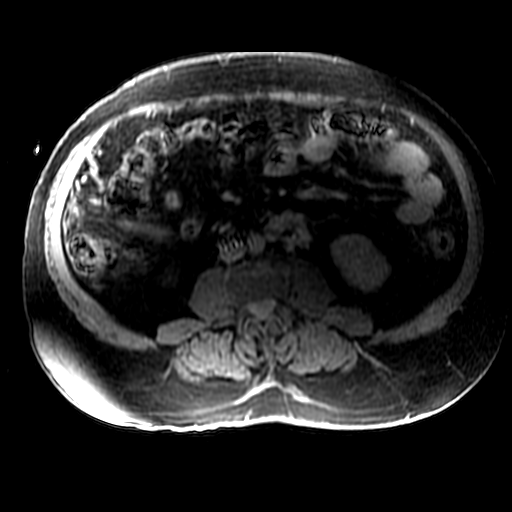
[im 70/140]
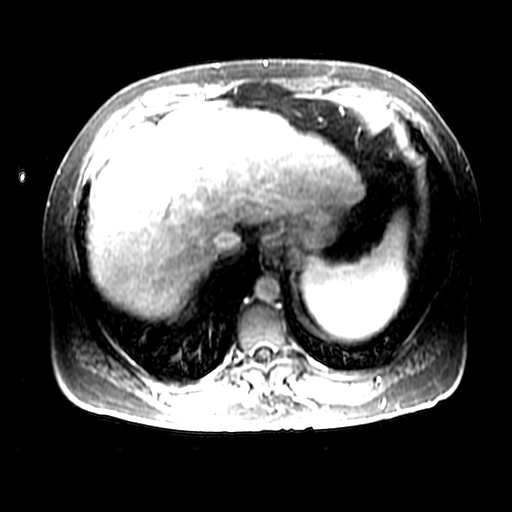
[im 140/140]
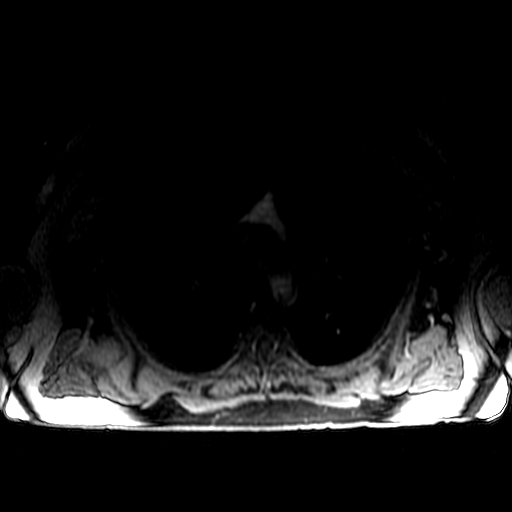

[Series 903: T1 dynamic post-contrast · axial · non-contrast · 3.9mm · 0.82mm/px · z∈[-9,+269]mm · 3 of 140 slices shown (4 of 4)]
[im 1/140]
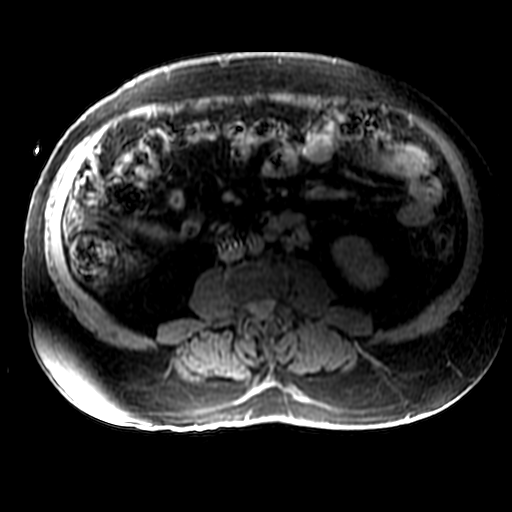
[im 70/140]
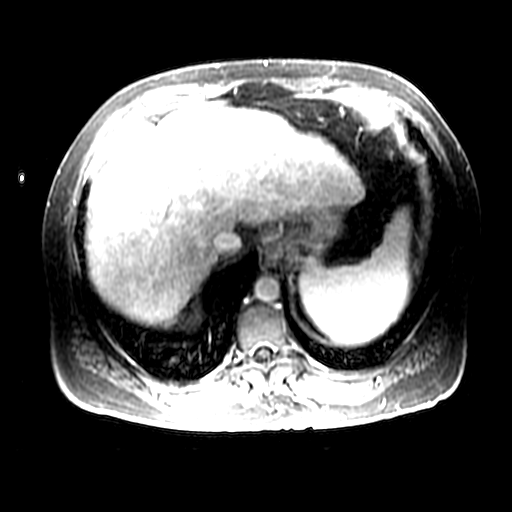
[im 140/140]
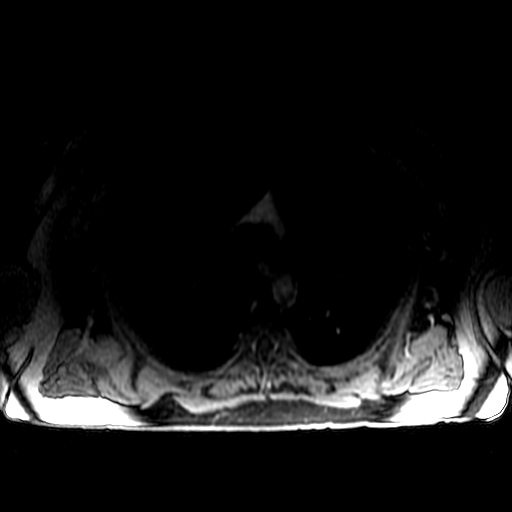

[21 of 48 positions shown; findings below may reference images not displayed]

FINDINGS: Lower chest: No effusion or consolidation, limited assessment on
MRI.

Hepatobiliary: Nodular hepatic contours. Morphologic features of
cirrhosis with background changes of lace-like T2 hyperintensity
throughout the liver with similar appearance to prior imaging.
Marked worsening of heterogeneity of liver parenchyma and
nodularity. Particularly lace-like pattern of T2 hyperintensity.
Increasing signs of developing confluent fibrosis in the cephalad
RIGHT hemiliver.

Hepatic steatosis as well.

No pericholecystic stranding.  No biliary duct distension.

Heterogeneous overall enhancement without signs of masslike area of
enhancement indicate hepatocellular carcinoma. Heterogeneity at the
dome of the RIGHT hemiliver omits suspected areas of fibrosis, these
areas display baseline T2-1 hyperintensity and mildly lobular
appearance. Question of washout versus is less enhancement than
surrounding fibrotic changes (image 61/[[id number]]) 15 mm area, more
geographic area peripheral to this similarly lower signal intensity
than adjacent fibrotic changes. Overall EDU category 3 favoring
macronodular changes amidst fibrosis. Variable perfusion in the
anterior RIGHT hemiliver without signs of masslike enhancement.

Portal vein is patent.

No splenic artery aneurysm.

Portosystemic collaterals in the upper abdomen as before.

Pancreas: Normal intrinsic T1 signal. No ductal dilation or sign of
inflammation. No focal lesion.

Spleen:  Enlarged as before.

Adrenals/Urinary Tract: No hydronephrosis, perinephric stranding or
suspicious renal lesion. Lower poles with suboptimal assessment due
to field of view constraints.

Stomach/Bowel: Portosystemic collaterals as on the previous study
including esophageal varices. LEFT retroperitoneal collaterals
constitutes shunt between the IVC and LEFT renal vein.

Vascular/Lymphatic:  Port systemic collaterals, no adenopathy.

Other:  No ascites

Musculoskeletal: No suspicious bone lesions identified.
IMPRESSION: 1. Marked worsening of heterogeneity of liver parenchyma and
nodularity. Increasing signs of confluent fibrosis in the cephalad
RIGHT hemiliver. Findings are suspicious for macronodular changes
amidst fibrosis. EDU category 3, favors macronodular changes
amidst fibrosis. Short interval follow-up in 3 months is suggested.
2. Stable sequela of portal hypertension with splenomegaly and
portosystemic collaterals including retroperitoneal varices bridging
IMV and LEFT renal vein.

## 2021-04-14 MED ORDER — GADOBUTROL 1 MMOL/ML IV SOLN
9.0000 mL | Freq: Once | INTRAVENOUS | Status: AC | PRN
  Administered 2021-04-14: 9 mL via INTRAVENOUS

## 2021-04-15 ENCOUNTER — Encounter: Payer: Self-pay | Admitting: Gastroenterology

## 2021-04-17 NOTE — Progress Notes (Signed)
I have discussed results of MRI with patient and patient's wife in detail ?I also discussed with Dr. Markus Daft ?New onset elevated AFP (from 3 to 8) ?MRI showing LI RADS 3 lesion with underlying cirrhosis ? ?Plan: ?-IR ultrasound-guided liver biopsy (ATTN: Dr Markus Daft) ?-Check CBC, CMP, PT/INR, ammonia,  a day prior ?-If ultrasound does not visualize the lesion, then repeat MRI with contrast in 3 months ? ? ?Pt and wife agrees with above approach ? ?RG

## 2021-04-18 ENCOUNTER — Other Ambulatory Visit: Payer: Self-pay

## 2021-04-18 DIAGNOSIS — K769 Liver disease, unspecified: Secondary | ICD-10-CM

## 2021-04-18 DIAGNOSIS — R772 Abnormality of alphafetoprotein: Secondary | ICD-10-CM

## 2021-04-21 ENCOUNTER — Other Ambulatory Visit: Payer: Self-pay

## 2021-04-21 DIAGNOSIS — K7030 Alcoholic cirrhosis of liver without ascites: Secondary | ICD-10-CM

## 2021-04-22 ENCOUNTER — Encounter: Payer: Self-pay | Admitting: Gastroenterology

## 2021-04-22 ENCOUNTER — Other Ambulatory Visit: Payer: Self-pay | Admitting: Gastroenterology

## 2021-04-22 DIAGNOSIS — K769 Liver disease, unspecified: Secondary | ICD-10-CM

## 2021-04-22 DIAGNOSIS — R772 Abnormality of alphafetoprotein: Secondary | ICD-10-CM

## 2021-04-22 NOTE — Addendum Note (Signed)
Addended by: Gillermina Hu on: 04/22/2021 10:45 AM ? ? Modules accepted: Orders ? ?

## 2021-04-22 NOTE — Telephone Encounter (Signed)
Spoke with pt: Pt notified that this has been sent to our scheduling office and they will contact him to schedule the [place] Bx ?Pt verbalized understanding with all questions answered.  ? ?

## 2021-04-24 ENCOUNTER — Ambulatory Visit (HOSPITAL_COMMUNITY)
Admission: RE | Admit: 2021-04-24 | Discharge: 2021-04-24 | Disposition: A | Discharge status: Home / Self Care | Payer: 59 | Source: Ambulatory Visit | Attending: Gastroenterology | Admitting: Gastroenterology

## 2021-04-24 DIAGNOSIS — K769 Liver disease, unspecified: Secondary | ICD-10-CM | POA: Insufficient documentation

## 2021-04-24 DIAGNOSIS — R772 Abnormality of alphafetoprotein: Secondary | ICD-10-CM | POA: Diagnosis present

## 2021-04-24 IMAGING — US US ABDOMEN LIMITED
1 series · 15 of 25 positions shown · non-contrast
Comparison: MRI of the abdomen [DATE].

CLINICAL DATA: Evaluate for lesion in liver dome.

EXAM:
ULTRASOUND ABDOMEN LIMITED RIGHT UPPER QUADRANT

[Series 1: us abdomen limited ruq mc & wl · 15 of 63 slices shown]
[im 1/63]
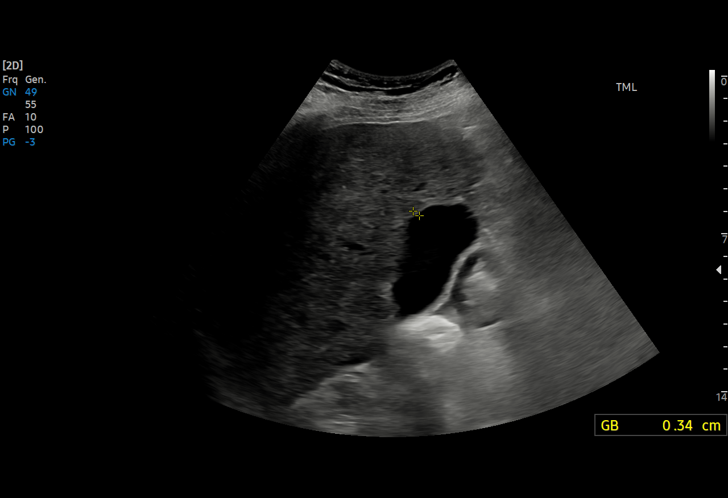
[im 6/63]
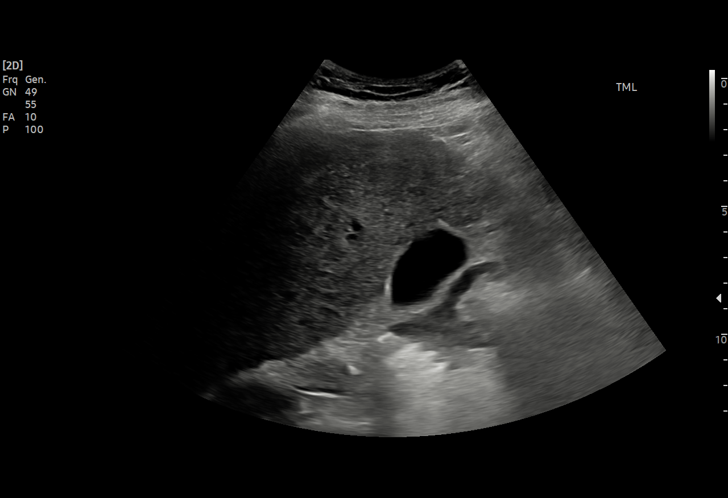
[im 11/63]
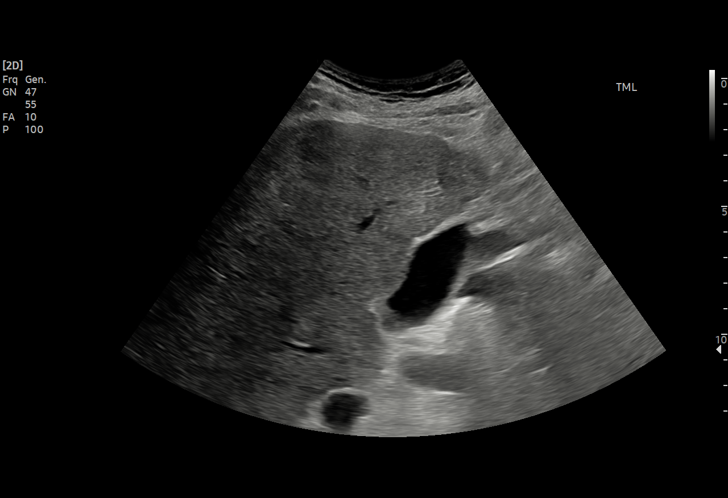
[im 13/63]
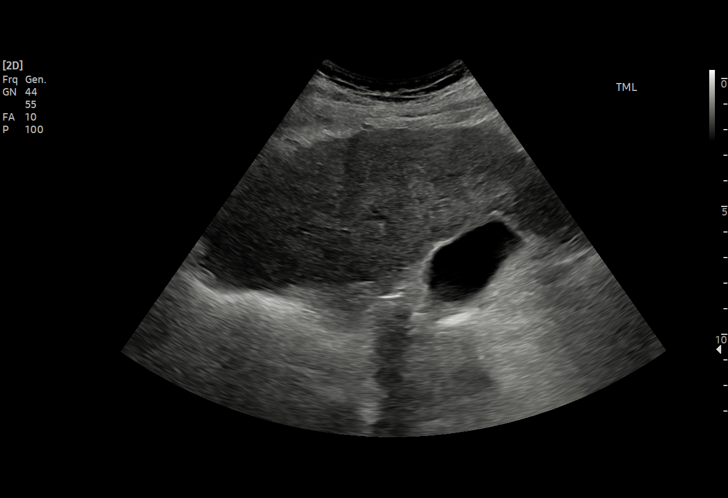
[im 19/63]
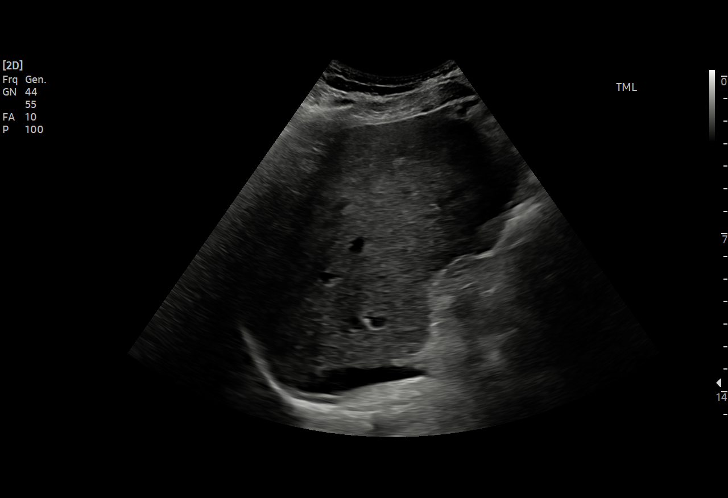
[im 24/63]
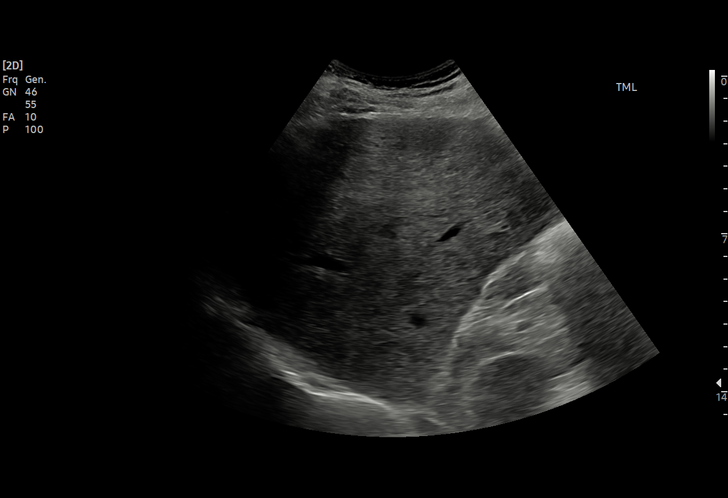
[im 26/63]
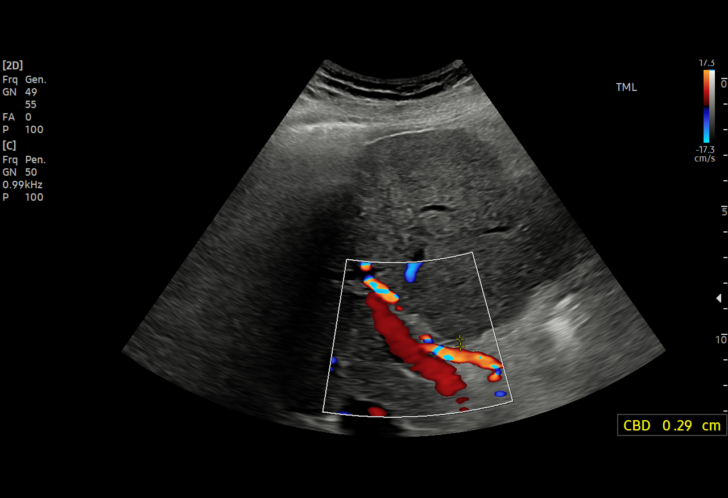
[im 32/63]
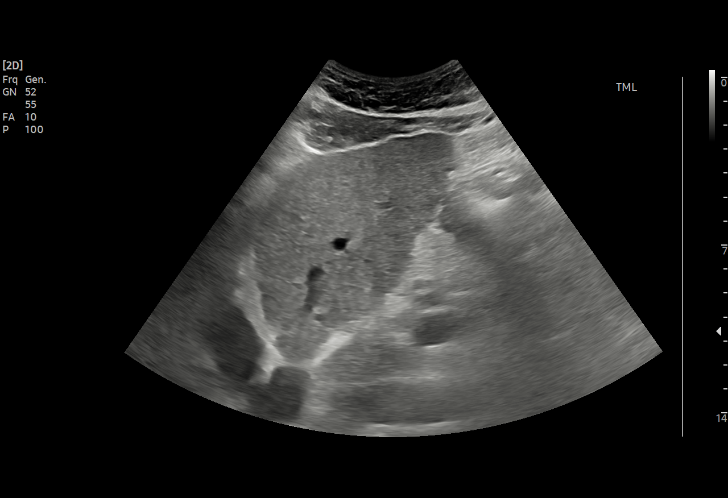
[im 37/63]
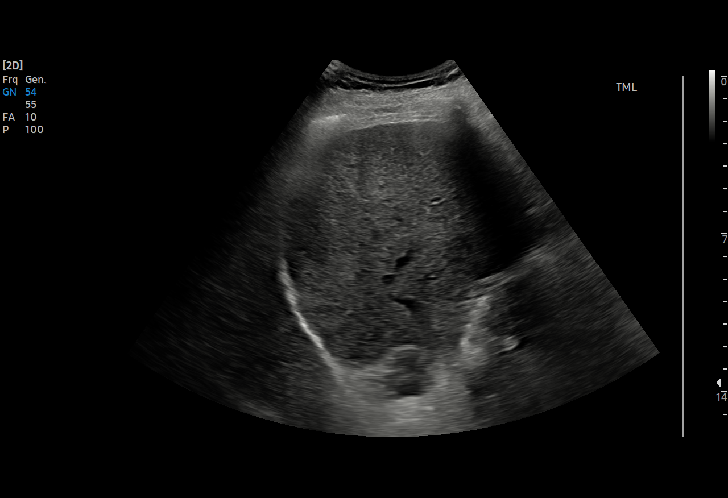
[im 39/63]
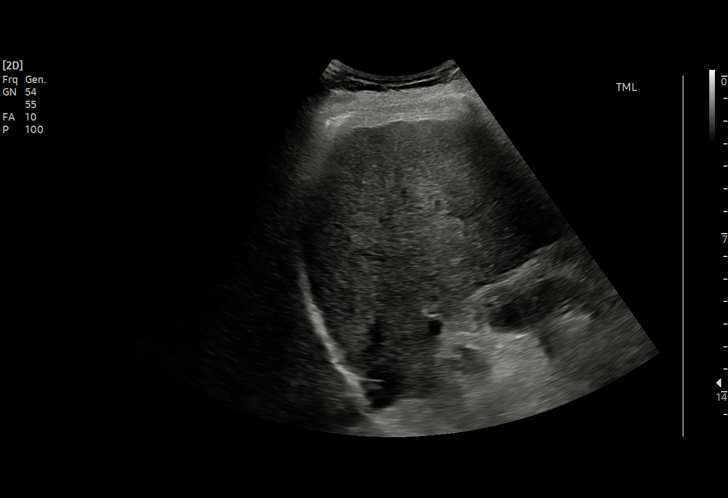
[im 44/63]
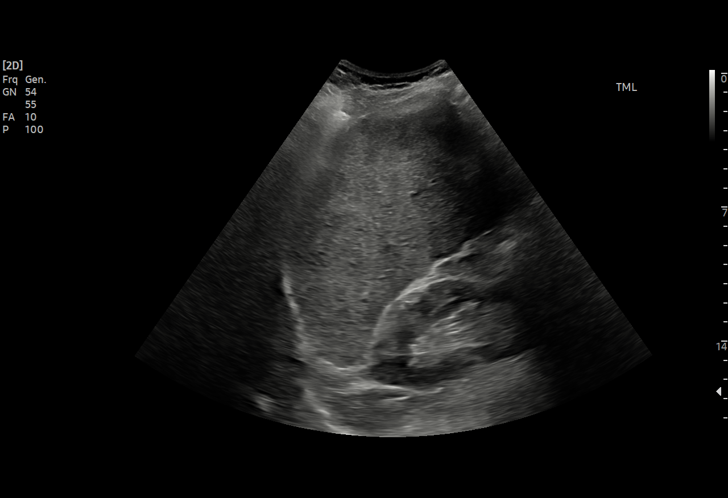
[im 50/63]
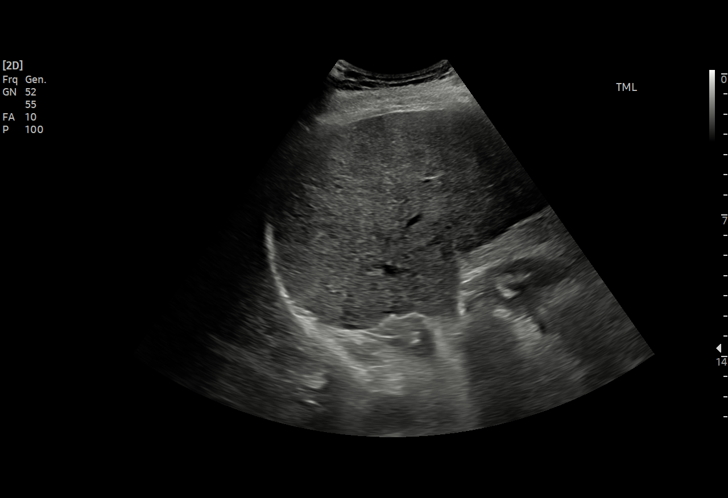
[im 52/63]
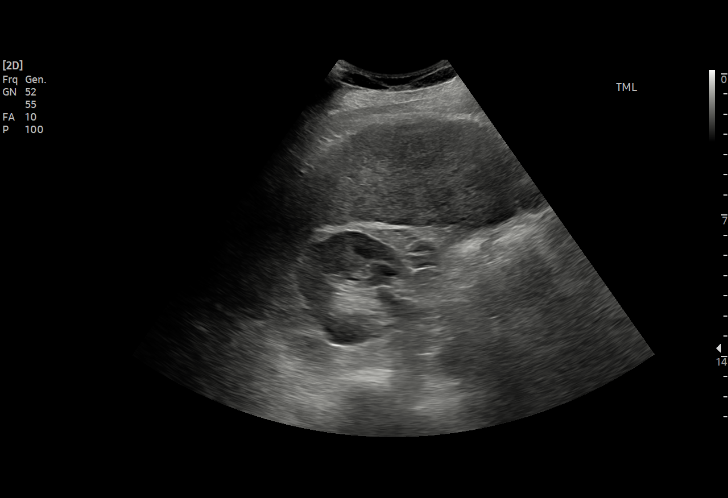
[im 57/63]
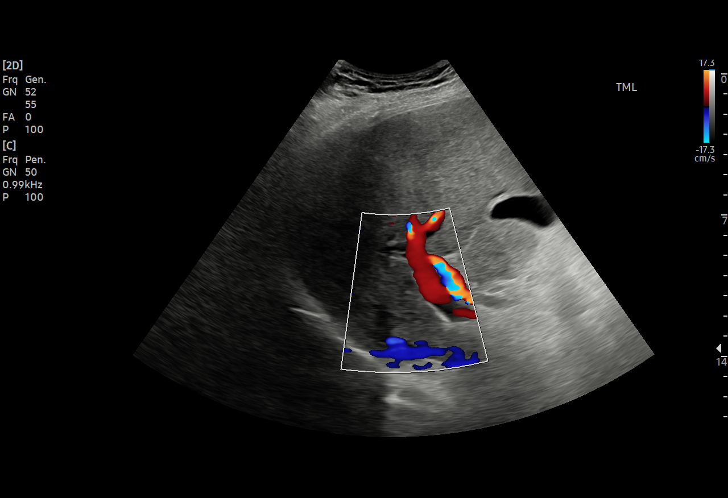
[im 63/63]
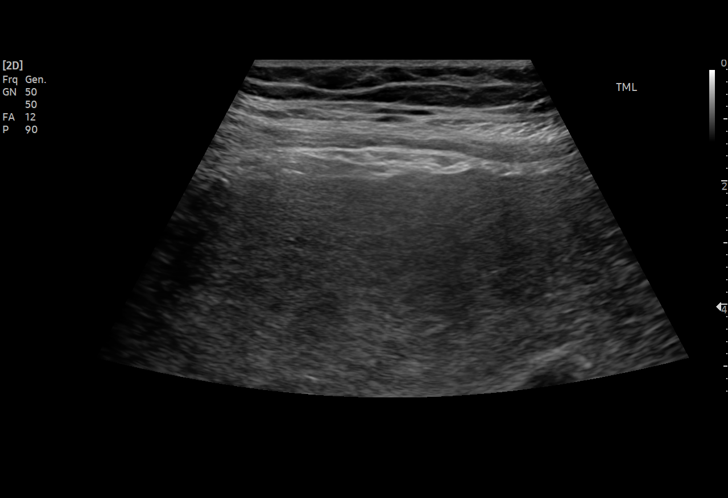

[15 of 25 positions shown; findings below may reference images not displayed]

FINDINGS: Gallbladder:

The gallbladder wall measures 3.4 mm. The gallbladder is otherwise
normal.

Common bile duct:

Diameter: 4.6 mm

Liver:

Heterogeneous echotexture. No focal mass. Nodular contour. Portal
vein is patent on color Doppler imaging with normal direction of
blood flow towards the liver.

Other: None.
IMPRESSION: 1. The gallbladder wall is mildly thickened. This is a nonspecific
finding but could be due to underlying cirrhosis. No other
abnormalities.
2. The common bile duct is normal.
3. The liver demonstrates a nodular contour and heterogeneous
echotexture consistent with cirrhosis. No mass identified. MRI would
of course be more sensitive.

## 2021-04-30 NOTE — Progress Notes (Signed)
Please inform the patient. ?[place] did not identify any definite lesion ?Plan: ?-Repeat MRI liver with contrast in 3 months ?-Also repeat AFP, CBC, CMP, PT/INR in 3 months ?-Follow-up in GI clinic after MRI. ?Send report to family physician

## 2021-05-22 DIAGNOSIS — N138 Other obstructive and reflux uropathy: Secondary | ICD-10-CM | POA: Insufficient documentation

## 2021-06-19 HISTORY — PX: CYSTOSCOPY WITH INSERTION OF UROLIFT: SHX6678

## 2021-06-25 ENCOUNTER — Other Ambulatory Visit: Payer: Self-pay

## 2021-06-25 MED ORDER — RIFAXIMIN 550 MG PO TABS: 550.0000 mg | ORAL_TABLET | Freq: Two times a day (BID) | ORAL | 0 refills | Status: DC

## 2021-07-02 NOTE — Telephone Encounter (Signed)
.  A user error has taken place: error

## 2021-07-13 ENCOUNTER — Other Ambulatory Visit (HOSPITAL_COMMUNITY): Payer: Self-pay

## 2021-07-14 ENCOUNTER — Telehealth: Payer: Self-pay | Admitting: Pharmacy Technician

## 2021-07-14 NOTE — Telephone Encounter (Signed)
Patient Advocate Encounter  Received notification from [employer] that prior authorization for XIFAXAN 550MG  is required.   PA submitted on 6.25.23 PROMPTPA Prior Auth (EOC) ID: [id number] Status is pending    Ricke Hey, CPhT Patient Advocate Phone: [phone]

## 2021-08-01 ENCOUNTER — Other Ambulatory Visit: Payer: Self-pay

## 2021-08-01 ENCOUNTER — Telehealth: Payer: Self-pay

## 2021-08-01 DIAGNOSIS — K769 Liver disease, unspecified: Secondary | ICD-10-CM

## 2021-08-01 DIAGNOSIS — D369 Benign neoplasm, unspecified site: Secondary | ICD-10-CM

## 2021-08-01 DIAGNOSIS — K7030 Alcoholic cirrhosis of liver without ascites: Secondary | ICD-10-CM

## 2021-08-01 DIAGNOSIS — K7682 Hepatic encephalopathy: Secondary | ICD-10-CM

## 2021-08-01 DIAGNOSIS — D509 Iron deficiency anemia, unspecified: Secondary | ICD-10-CM

## 2021-08-01 NOTE — Telephone Encounter (Signed)
Received Reminder in Epic Pt was scheduled for repeat MRI at [facility] on 08/11/2021 at 8:00 AM. Pt to have nothing to eat or drink 4 hours prior: Pt made aware: Orders for labs entered into Epic: Pt made aware: Location to lab given:  Pt was scheduled for an office with Dr. Lyndel Safe on 09/15/2021 at 10:40 AM: Pt made aware: :Location Given Pt verbalized understanding with all questions answered.

## 2021-08-01 NOTE — Telephone Encounter (Signed)
-----   Message from Gillermina Hu, RN sent at 05/01/2021  9:53 AM EDT ----- Regarding: Repeat MRI/Labs/Schedule appt Jackquline Denmark, MD  Gillermina Hu, RN Please inform the patient.  [place] did not identify any definite lesion  Plan:  -Repeat MRI liver with contrast in 3 months  -Also repeat AFP, CBC, CMP, PT/INR in 3 months  -Follow-up in GI clinic after MRI.  Send report to family physician  Sent 05/01/2021

## 2021-08-04 NOTE — Telephone Encounter (Signed)
Received a fax regarding Prior Authorization from [facility] for [place] 550MG. Authorization has been DENIED because requirements have not been met.  This plan requires a diagnosis for the prevention of overt hepatic encephalopathy, and for the prevention of recurrent episodes of hepatic encephalopathy after the second episode, along with supporting documentation.  Clista Bernhardt, CPhT Pharmacy Patient Advocate Specialist [place] Patient Advocate Team Direct Number: [phone]   Fax: [phone]

## 2021-08-11 ENCOUNTER — Ambulatory Visit (HOSPITAL_COMMUNITY)
Admission: RE | Admit: 2021-08-11 | Discharge: 2021-08-11 | Disposition: A | Discharge status: Home / Self Care | Payer: 59 | Source: Ambulatory Visit | Attending: Gastroenterology | Admitting: Gastroenterology

## 2021-08-11 ENCOUNTER — Other Ambulatory Visit (INDEPENDENT_AMBULATORY_CARE_PROVIDER_SITE_OTHER): Payer: 59

## 2021-08-11 DIAGNOSIS — D509 Iron deficiency anemia, unspecified: Secondary | ICD-10-CM | POA: Diagnosis not present

## 2021-08-11 DIAGNOSIS — D369 Benign neoplasm, unspecified site: Secondary | ICD-10-CM | POA: Diagnosis not present

## 2021-08-11 DIAGNOSIS — K7682 Hepatic encephalopathy: Secondary | ICD-10-CM

## 2021-08-11 DIAGNOSIS — K769 Liver disease, unspecified: Secondary | ICD-10-CM | POA: Diagnosis present

## 2021-08-11 DIAGNOSIS — K7030 Alcoholic cirrhosis of liver without ascites: Secondary | ICD-10-CM | POA: Diagnosis present

## 2021-08-11 LAB — COMPREHENSIVE METABOLIC PANEL
ALT: 29 U/L (ref 0–53)
AST: 61 U/L — ABNORMAL HIGH (ref 0–37)
Albumin: 3.3 g/dL — ABNORMAL LOW (ref 3.5–5.2)
Alkaline Phosphatase: 194 U/L — ABNORMAL HIGH (ref 39–117)
BUN: 8 mg/dL (ref 6–23)
CO2: 23 mEq/L (ref 19–32)
Calcium: 8.7 mg/dL (ref 8.4–10.5)
Chloride: 107 mEq/L (ref 96–112)
Creatinine, Ser: 0.97 mg/dL (ref 0.40–1.50)
GFR: 82.45 mL/min (ref >60.00)
Glucose, Bld: 110 mg/dL — ABNORMAL HIGH (ref 70–99)
Potassium: 4.1 mEq/L (ref 3.5–5.1)
Sodium: 139 mEq/L (ref 135–145)
Total Bilirubin: 1.6 mg/dL — ABNORMAL HIGH (ref 0.2–1.2)
Total Protein: 6.3 g/dL (ref 6.0–8.3)

## 2021-08-11 LAB — CBC WITH DIFFERENTIAL/PLATELET
Basophils Absolute: 0 10*3/uL (ref 0.0–0.1)
Basophils Relative: 0.6 % (ref 0.0–3.0)
Eosinophils Absolute: 0.4 10*3/uL (ref 0.0–0.7)
Eosinophils Relative: 7.9 % — ABNORMAL HIGH (ref 0.0–5.0)
HCT: 34.9 % — ABNORMAL LOW (ref 39.0–52.0)
Hemoglobin: 11.7 g/dL — ABNORMAL LOW (ref 13.0–17.0)
Lymphocytes Relative: 25 % (ref 12.0–46.0)
Lymphs Abs: 1.3 10*3/uL (ref 0.7–4.0)
MCHC: 33.4 g/dL (ref 30.0–36.0)
MCV: 97.6 fl (ref 78.0–100.0)
Monocytes Absolute: 0.7 10*3/uL (ref 0.1–1.0)
Monocytes Relative: 12.8 % — ABNORMAL HIGH (ref 3.0–12.0)
Neutro Abs: 2.8 10*3/uL (ref 1.4–7.7)
Neutrophils Relative %: 53.7 % (ref 43.0–77.0)
Platelets: 93 10*3/uL — ABNORMAL LOW (ref 150.0–400.0)
RBC: 3.58 Mil/uL — ABNORMAL LOW (ref 4.22–5.81)
RDW: 14.3 % (ref 11.5–15.5)
WBC: 5.1 10*3/uL (ref 4.0–10.5)

## 2021-08-11 LAB — PROTIME-INR
INR: 1.2 ratio — ABNORMAL HIGH (ref 0.8–1.0)
Prothrombin Time: 13.3 s — ABNORMAL HIGH (ref 9.6–13.1)

## 2021-08-11 LAB — AMMONIA: Ammonia: 26 umol/L (ref 11–35)

## 2021-08-11 MED ORDER — GADOBUTROL 1 MMOL/ML IV SOLN: 10.0000 mL | Freq: Once | INTRAVENOUS | Status: DC | PRN

## 2021-08-11 MED ORDER — GADOBUTROL 1 MMOL/ML IV SOLN
9.0000 mL | Freq: Once | INTRAVENOUS | Status: AC | PRN
  Administered 2021-08-11: 9 mL via INTRAVENOUS

## 2021-08-13 LAB — AFP TUMOR MARKER: AFP-Tumor Marker: 4.9 ng/mL (ref <6.1)

## 2021-08-16 ENCOUNTER — Other Ambulatory Visit: Payer: Self-pay | Admitting: Nurse Practitioner

## 2021-08-20 NOTE — Telephone Encounter (Signed)
Patient will fill out patient assistance program and will try to see if he can get approve. Will sent out assistance on the 9th with Dr's signature and he verbalized he will fill out and mail back or fax to company.  Patient pays roughly 80-100 for medication monthly

## 2021-08-22 ENCOUNTER — Other Ambulatory Visit: Payer: Self-pay

## 2021-08-22 MED ORDER — RIFAXIMIN 550 MG PO TABS: 550.0000 mg | ORAL_TABLET | Freq: Two times a day (BID) | ORAL | 3 refills | Status: DC

## 2021-08-27 NOTE — Telephone Encounter (Signed)
Patient's assistance program sent to pt with Dr signature. Patient made aware last week about this

## 2021-09-15 ENCOUNTER — Encounter: Payer: Self-pay | Admitting: Gastroenterology

## 2021-09-15 ENCOUNTER — Ambulatory Visit (INDEPENDENT_AMBULATORY_CARE_PROVIDER_SITE_OTHER): Payer: 59 | Admitting: Gastroenterology

## 2021-09-15 VITALS — Ht 66.0 in | Wt 202.2 lb

## 2021-09-15 DIAGNOSIS — K7682 Hepatic encephalopathy: Secondary | ICD-10-CM

## 2021-09-15 DIAGNOSIS — K7030 Alcoholic cirrhosis of liver without ascites: Secondary | ICD-10-CM | POA: Diagnosis not present

## 2021-09-15 MED ORDER — FOLIC ACID 1 MG PO TABS: 1.0000 mg | ORAL_TABLET | Freq: Every day | ORAL | 4 refills | Status: DC

## 2021-09-15 NOTE — Progress Notes (Signed)
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  No ascites. Quit ETOH 07/2020  Subclinical hepatic encephalopathy. Didn't tolerate lactulose d/t abdominal cramps.  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  Hb 6.6 on Adm s/p 2U to 8.4. 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 Next due 11/2023  Plan: -Decrease protonix '40mg'$  po QD -Continue iron BID -Continue rifaxamin '550mg'$  po BID. Pt assistance program/drug rep.  -D/W pt extensively. -FU in 6 months. At FU, recheck labs.   HPI:    Kristopher Ramos is a 66 y.o. male  For follow-up Doing very well from GI standpoint  No nausea, vomiting, heartburn, regurgitation, odynophagia or dysphagia.  No significant diarrhea or constipation.  No melena or hematochezia. No unintentional weight loss. No abdominal pain.  Compliant with low salt diet.  Has been drinking plenty of fluids.  Had "UroLift" procedure for BPH.  Has not helped.  He has FU appointment with urology coming up  Most recently had MR Abdo with contrast 07/2021 which was negative for hepatoma.  Most recent AFP was normal.  No alcohol since 07/19/2020  MELD 3.0: 10 at 08/11/2021 10:03 AM MELD-Na: 10 at 08/11/2021 10:03 AM Calculated from: Serum Creatinine: 0.97 mg/dL (Using min of 1 mg/dL) at 08/11/2021 10:03 AM Serum Sodium: 139 mEq/L (Using max of 137 mEq/L) at 08/11/2021 10:03 AM Total Bilirubin: 1.6 mg/dL at 08/11/2021 10:03 AM Serum Albumin: 3.3 g/dL at 08/11/2021 10:03 AM INR(ratio): 1.2 ratio at 08/11/2021 10:03 AM Age at listing (hypothetical): 20 years Sex: Male at 08/11/2021 10:03 AM     From previous notes: Adm to [facility] with new Dx of acute hepatitis on chronic EtOH cirrhosis 07/2020 (s/p liver bx as below).  He was found to have microcytic anemia with heme-negative stools. Hb 6.6 s/p 2U to 8.4.  He underwent EGD which showed GAVE (no EV), treated  with RFA/APC. Rpt EGD 11/2020 with significant resolution.  Does not want to go back on lactulose.  He is having problems getting rifaximin-gets 1 week at a time.  Not getting overtly confused.  Has stopped drinking all alcohol.  Last drink 07/19/2020.  Has stopped nonsteroidals as well.  Wt Readings from Last 3 Encounters:  09/15/21 202 lb 4 oz (91.7 kg)  03/03/21 202 lb 6 oz (91.8 kg)  12/09/20 192 lb 0.3 oz (87.1 kg)     Previous GI/liver work-up:  MRI Abdo with contrast 07/2021 1. No enhancing lesion within liver to suggest hepatoma. 2. Morphological changes of hepatic cirrhosis appears slightly improved from comparison exam. 3. No ascites. 4. Minimal enlargement spleen similar prior.  Last EtOH use 07/19/2020.  Liver bx 07/30/2020 -Moderate to severely active steatohepatitis (grade 2-3 of 3) with  cirrhosis (stage 4 of 4).   -neg HSV 1/2 -neg acute hep  -non reactive HBsAb 07/2020 -AFP 2/7 07/2020 -neg A1AT, ANA, AMA  EGD 07/19/2020 - GAVE. Treated with RFA followed by APC - Mild portal hypertensive gastropathy. - Gastric polyps s/p polypectomy (x 2) - No EV  EGD 12/09/2020: - Portal hypertensive gastropathy. Biopsied. - Minimal Gastric antral vascular ectasia without bleeding. No need for rpt RFA. - A few gastric polyps.   Colon 12/09/2020 - Two 4 to 6 mm polyps in the mid transverse colon and in the distal transverse colon, removed with a cold snare. Resected and retrieved. - Mild pancolonic  diverticulosis predominantly in the right colon. - Non-bleeding internal hemorrhoids. - The examination was otherwise normal on direct and retroflexion views. - Rpt in 3 yrs with 2 day prep d/t prev H/O polyps, quality of prep.  [place] 07/19/2020 Cirrhotic change of the liver. No focal intrahepatic mass identified. No ascites.   CT AP 07/2020 1. Cirrhotic liver morphology with an area of subtle heterogeneous enhancement towards the dome. MRI abdomen with and without  contrast recommended to further evaluate. 2. No intra or extrahepatic biliary duct dilatation. 3. No overt findings of portal venous hypertension.  MRI 07/2020 1. Heterogeneous signal intensity hepatic dome, corresponding with the finding on prior CT is most consistent with fibrosis. 2. Cirrhotic hepatic morphology with mild splenomegaly suggestive portal hypertension. No ascites. 3. Arterially enhancing 7 mm focus in the peripheral right lobe of the liver which demonstrates washout but without definite suspicious features, consistent with a LI-RADS category 3 hepatic lesion.  Colon - Two 4 to 6 mm polyps in the mid transverse colon and in the distal transverse colon, removed with a cold snare. Resected and retrieved. - Mild pancolonic diverticulosis predominantly in the right colon. - Non-bleeding internal hemorrhoids. - The examination was otherwise normal on direct and retroflexion views. Past Medical History:  Diagnosis Date   Alcohol abuse 07/19/2020   Benign essential hypertension 01/08/2015   Generalized anxiety disorder 07/19/2020   GERD (gastroesophageal reflux disease) 07/19/2020   Mixed hyperlipidemia 09/21/2017    Past Surgical History:  Procedure Laterality Date   BIOPSY  07/19/2020   Procedure: BIOPSY;  Surgeon: Jackquline Denmark, MD;  Location: [facility] ENDOSCOPY;  Service: Endoscopy;;   BIOPSY  12/09/2020   Procedure: BIOPSY;  Surgeon: Jackquline Denmark, MD;  Location: [facility] ENDOSCOPY;  Service: Endoscopy;;   COLONOSCOPY WITH PROPOFOL N/A 12/09/2020   Procedure: COLONOSCOPY WITH PROPOFOL;  Surgeon: Jackquline Denmark, MD;  Location: [facility] ENDOSCOPY;  Service: Endoscopy;  Laterality: N/A;   CYSTOSCOPY WITH INSERTION OF UROLIFT  06/19/2021   [place]   ESOPHAGOGASTRODUODENOSCOPY (EGD) WITH PROPOFOL N/A 07/19/2020   Procedure: ESOPHAGOGASTRODUODENOSCOPY (EGD) WITH PROPOFOL;  Surgeon: Jackquline Denmark, MD;  Location[facility] ENDOSCOPY;  Service: Endoscopy;   Laterality: N/A;   ESOPHAGOGASTRODUODENOSCOPY (EGD) WITH PROPOFOL N/A 12/09/2020   Procedure: ESOPHAGOGASTRODUODENOSCOPY (EGD) WITH PROPOFOL;  Surgeon: Jackquline Denmark, MD;  Location: [facility] ENDOSCOPY;  Service: Endoscopy;  Laterality: N/A;   GI RADIOFREQUENCY ABLATION  07/19/2020   Procedure: GI RADIOFREQUENCY ABLATION;  Surgeon: Jackquline Denmark, MD;  Location: [facility] ENDOSCOPY;  Service: Endoscopy;;   POLYPECTOMY  07/19/2020   Procedure: POLYPECTOMY;  Surgeon: Jackquline Denmark, MD;  Location: [facility] ENDOSCOPY;  Service: Endoscopy;;   POLYPECTOMY  12/09/2020   Procedure: POLYPECTOMY;  Surgeon: Jackquline Denmark, MD;  Location: [facility] ENDOSCOPY;  Service: Endoscopy;;    Family History  Problem Relation Age of Onset   High blood pressure Mother    Dementia Mother    Ulcerative colitis Father    Stomach cancer Neg Hx    Colon cancer Neg Hx    Pancreatic cancer Neg Hx     Social History   Tobacco Use   Smoking status: Never   Smokeless tobacco: Current    Types: Snuff  Vaping Use   Vaping Use: Never used  Substance Use Topics   Alcohol use: Not Currently    Current Outpatient Medications  Medication Sig Dispense Refill   citalopram (CELEXA) 40 MG tablet Take 40 mg by mouth daily.     EPINEPHrine 0.3 mg/0.3 mL  IJ SOAJ injection Inject 0.3 mg into the muscle as needed for anaphylaxis.     ferrous sulfate 325 (65 FE) MG tablet Take 325 mg by mouth 2 (two) times daily.     folic acid (FOLVITE) 1 MG tablet Take 1 tablet (1 mg total) by mouth daily. 60 tablet 0   hydrOXYzine (ATARAX/VISTARIL) 10 MG tablet Take 1 tablet (10 mg total) by mouth 3 (three) times daily. (Patient taking differently: Take 10 mg by mouth daily.) 90 tablet 0   pantoprazole (PROTONIX) 40 MG tablet Take 1 tablet (40 mg total) by mouth 2 (two) times daily. 60 tablet 3   rifaximin (XIFAXAN) 550 MG TABS tablet Take 1 tablet (550 mg total) by mouth 2 (two) times daily. 60 tablet 3   thiamine 100 MG tablet Take 1 tablet (100 mg total) by mouth  daily. 30 tablet 0   No current facility-administered medications for this visit.    Allergies  Allergen Reactions   Yellow Jacket Venom Anaphylaxis and Shortness Of Breath    Review of Systems:  neg     Physical Exam:    Ht '5\' 6"'$  (1.676 m)   Wt 202 lb 4 oz (91.7 kg)   BMI 32.64 kg/m  Wt Readings from Last 3 Encounters:  09/15/21 202 lb 4 oz (91.7 kg)  03/03/21 202 lb 6 oz (91.8 kg)  12/09/20 192 lb 0.3 oz (87.1 kg)   Constitutional:  Well-developed, in no acute distress. Psychiatric: Normal mood and affect. Behavior is normal. HEENT: Pupils normal.  Conjunctivae are normal. No scleral icterus. Cardiovascular: Normal rate, regular rhythm. No edema Pulmonary/chest: Effort normal and breath sounds normal. No wheezing, rales or rhonchi. Abdominal: Soft, nondistended. Nontender. Bowel sounds active throughout. There are no masses palpable. No hepatomegaly. Rectal: Deferred Neurological: Alert and oriented to person place and time. Skin: Skin is warm and dry. No rashes noted.  Data Reviewed: I have personally reviewed following labs and imaging studies  CBC:    Latest Ref Rng & Units 08/11/2021   10:03 AM 03/05/2021    9:50 AM 10/22/2020    2:47 PM  CBC  WBC 4.0 - 10.5 K/uL 5.1  4.9  6.8   Hemoglobin 13.0 - 17.0 g/dL 11.7  13.9  13.8   Hematocrit 39.0 - 52.0 % 34.9  40.7  40.4   Platelets 150.0 - 400.0 K/uL 93.0  97.0  110.0     CMP:    Latest Ref Rng & Units 08/11/2021   10:03 AM 03/05/2021    9:50 AM 10/22/2020    2:47 PM  CMP  Glucose 70 - 99 mg/dL 110  139  89   BUN 6 - 23 mg/dL '8  10  10   '$ Creatinine 0.40 - 1.50 mg/dL 0.97  1.00  0.75   Sodium 135 - 145 mEq/L 139  137  135   Potassium 3.5 - 5.1 mEq/L 4.1  3.9  4.1   Chloride 96 - 112 mEq/L 107  105  101   CO2 19 - 32 mEq/L '23  25  26   '$ Calcium 8.4 - 10.5 mg/dL 8.7  9.1  9.7   Total Protein 6.0 - 8.3 g/dL 6.3  7.7  7.2   Total Bilirubin 0.2 - 1.2 mg/dL 1.6  1.9  1.4   Alkaline Phos 39 - 117 U/L 194  196   144   AST 0 - 37 U/L 61  55  48   ALT 0 - 53 U/L 29  [place], MD 09/15/2021, 10:51 AM  Cc: Kristopher Glee., MD

## 2021-09-15 NOTE — Patient Instructions (Signed)
_______________________________________________________  If you are age 65 or older, your body mass index should be between 23-30. Your Body mass index is 32.64 kg/m. If this is out of the aforementioned range listed, please consider follow up with your Primary Care Provider.  If you are age 76 or younger, your body mass index should be between 19-25. Your Body mass index is 32.64 kg/m. If this is out of the aformentioned range listed, please consider follow up with your Primary Care Provider.   ________________________________________________________  The [facility] providers would like to encourage you to use [facility] to communicate with providers for non-urgent requests or questions.  Due to long hold times on the telephone, sending your provider a message by [facility] may be a faster and more efficient way to get a response.  Please allow 48 business hours for a response.  Please remember that this is for non-urgent requests.  _______________________________________________________  Decrease Protonix to '40mg'$  daily  Continue iron 2 times a day and rifaxamin 2 times a day  Please follow up in 6 months. Give [place] a call at [phone] to schedule an appointment. Thank you,  Dr. Jackquline Denmark

## 2021-10-19 HISTORY — PX: WRIST ARTHROCENTESIS: SUR48

## 2021-11-14 DIAGNOSIS — S52502A Unspecified fracture of the lower end of left radius, initial encounter for closed fracture: Secondary | ICD-10-CM | POA: Insufficient documentation

## 2021-11-23 ENCOUNTER — Encounter: Payer: Self-pay | Admitting: Gastroenterology

## 2021-11-24 ENCOUNTER — Other Ambulatory Visit: Payer: Self-pay

## 2021-11-24 MED ORDER — FERROUS SULFATE 325 (65 FE) MG PO TABS: 325.0000 mg | ORAL_TABLET | Freq: Two times a day (BID) | ORAL | 1 refills | Status: DC

## 2021-11-24 NOTE — Progress Notes (Signed)
Refill of oral iron sent to [facility]

## 2021-11-26 ENCOUNTER — Encounter: Payer: Self-pay | Admitting: Gastroenterology

## 2021-12-03 NOTE — Telephone Encounter (Signed)
Thanks for letting me know Hope he feels better soon. These medicines will not affect liver tests RG

## 2021-12-31 ENCOUNTER — Other Ambulatory Visit: Payer: Self-pay | Admitting: Gastroenterology

## 2021-12-31 ENCOUNTER — Telehealth: Payer: Self-pay | Admitting: Gastroenterology

## 2021-12-31 MED ORDER — RIFAXIMIN 550 MG PO TABS: 550.0000 mg | ORAL_TABLET | Freq: Two times a day (BID) | ORAL | 3 refills | Status: DC

## 2021-12-31 NOTE — Telephone Encounter (Signed)
Inbound call from patient requesting refill on xifaxin. Please advise.

## 2021-12-31 NOTE — Telephone Encounter (Signed)
Done

## 2022-01-23 NOTE — Telephone Encounter (Signed)
Spoke with Kristopher Ramos. Kristopher Ramos stated that he has been having diarrhea since Monday. Averaging 4 times daily, Imodium is helping some. Kristopher Ramos states that he also feels weak as well. Requesting Office visit for diarrhea and questions if labs need to be done: Kristopher Ramos was scheduled for Carl Best NP 01/30/2022 at 1:30: Kristopher Ramos made aware:   Kristopher Ramos verbalized understanding with all questions answered.

## 2022-01-30 ENCOUNTER — Encounter: Payer: Self-pay | Admitting: Nurse Practitioner

## 2022-01-30 ENCOUNTER — Telehealth: Payer: Self-pay

## 2022-01-30 ENCOUNTER — Other Ambulatory Visit (INDEPENDENT_AMBULATORY_CARE_PROVIDER_SITE_OTHER): Payer: Managed Care, Other (non HMO)

## 2022-01-30 ENCOUNTER — Ambulatory Visit: Payer: Managed Care, Other (non HMO) | Admitting: Nurse Practitioner

## 2022-01-30 VITALS — BP 130/60 | HR 80 | Ht 66.0 in | Wt 215.0 lb

## 2022-01-30 DIAGNOSIS — K7460 Unspecified cirrhosis of liver: Secondary | ICD-10-CM | POA: Diagnosis not present

## 2022-01-30 DIAGNOSIS — A09 Infectious gastroenteritis and colitis, unspecified: Secondary | ICD-10-CM

## 2022-01-30 DIAGNOSIS — R1012 Left upper quadrant pain: Secondary | ICD-10-CM

## 2022-01-30 DIAGNOSIS — R1032 Left lower quadrant pain: Secondary | ICD-10-CM | POA: Diagnosis not present

## 2022-01-30 LAB — AMMONIA: Ammonia: 41 umol/L — ABNORMAL HIGH (ref 11–35)

## 2022-01-30 LAB — PROTIME-INR
INR: 1.5 ratio — ABNORMAL HIGH (ref 0.8–1.0)
Prothrombin Time: 15.8 s — ABNORMAL HIGH (ref 9.6–13.1)

## 2022-01-30 LAB — CBC WITH DIFFERENTIAL/PLATELET
Basophils Absolute: 0 10*3/uL (ref 0.0–0.1)
Basophils Relative: 0.6 % (ref 0.0–3.0)
Eosinophils Absolute: 0.3 10*3/uL (ref 0.0–0.7)
Eosinophils Relative: 4.5 % (ref 0.0–5.0)
HCT: 31.4 % — ABNORMAL LOW (ref 39.0–52.0)
Hemoglobin: 10.5 g/dL — ABNORMAL LOW (ref 13.0–17.0)
Lymphocytes Relative: 20.9 % (ref 12.0–46.0)
Lymphs Abs: 1.2 10*3/uL (ref 0.7–4.0)
MCHC: 33.5 g/dL (ref 30.0–36.0)
MCV: 107.4 fl — ABNORMAL HIGH (ref 78.0–100.0)
Monocytes Absolute: 1 10*3/uL (ref 0.1–1.0)
Monocytes Relative: 16.8 % — ABNORMAL HIGH (ref 3.0–12.0)
Neutro Abs: 3.2 10*3/uL (ref 1.4–7.7)
Neutrophils Relative %: 57.2 % (ref 43.0–77.0)
Platelets: 94 10*3/uL — ABNORMAL LOW (ref 150.0–400.0)
RBC: 2.93 Mil/uL — ABNORMAL LOW (ref 4.22–5.81)
RDW: 16.1 % — ABNORMAL HIGH (ref 11.5–15.5)
WBC: 5.7 10*3/uL (ref 4.0–10.5)

## 2022-01-30 LAB — BASIC METABOLIC PANEL
BUN: 8 mg/dL (ref 6–23)
CO2: 24 mEq/L (ref 19–32)
Calcium: 8.4 mg/dL (ref 8.4–10.5)
Chloride: 107 mEq/L (ref 96–112)
Creatinine, Ser: 0.78 mg/dL (ref 0.40–1.50)
GFR: 93.87 mL/min (ref >60.00)
Glucose, Bld: 89 mg/dL (ref 70–99)
Potassium: 4.3 mEq/L (ref 3.5–5.1)
Sodium: 137 mEq/L (ref 135–145)

## 2022-01-30 LAB — HEPATIC FUNCTION PANEL
ALT: 40 U/L (ref 0–53)
AST: 80 U/L — ABNORMAL HIGH (ref 0–37)
Albumin: 2.9 g/dL — ABNORMAL LOW (ref 3.5–5.2)
Alkaline Phosphatase: 165 U/L — ABNORMAL HIGH (ref 39–117)
Bilirubin, Direct: 1.1 mg/dL — ABNORMAL HIGH (ref 0.0–0.3)
Total Bilirubin: 2.7 mg/dL — ABNORMAL HIGH (ref 0.2–1.2)
Total Protein: 5.9 g/dL — ABNORMAL LOW (ref 6.0–8.3)

## 2022-01-30 NOTE — Telephone Encounter (Signed)
Pt stated that he received a phone call from his insurance company at 4:00 PM this evening  with an authorization number for his CT scan: Pt stated that he did not write the number down not knowing  that he was going to need it. Pt stated that he received a call from [employer] about 4:30 stating that they are going to have to cancel his CT scan due to not having an authorization number.  Pt and pt wife are very upset and frustrated due to this. Pt was notified that our [place] team leaves at 5:00 PM, Pt was notified that our office closes at 5:00 PM as well. Apologizes provided to pt and recommendations were given to pt to call his insurance company to get the PA number and call [facility] and provide them with the number.   Pt and Pt wife were not satisfied with this recommendation and stated that its not there job to do that.  Recommendations given once more to call his insurance company to get the PA number and call [facility] and provide them with the number.
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
## 2022-01-30 NOTE — Patient Instructions (Addendum)
Your provider has requested that you go to the basement level for lab work before leaving today. Press "B" on the elevator. The lab is located at the first door on the left as you exit the elevator.   You have been scheduled for a CT scan of the abdomen and pelvis at [facility], 1st floor Radiology. You are scheduled on 02/01/22  at 11 am. You should arrive 15 minutes prior to your appointment time for registration.  We are giving you 2 bottles of contrast today that you will need to drink before arriving for the exam. The solution may taste better if refrigerated so put them in the refrigerator when you get home, but do NOT add ice or any other liquid to this solution as that would dilute it. Shake well before drinking.    You may take any medications as prescribed with a small amount of water, if necessary. If you take any of the following medications: METFORMIN, GLUCOPHAGE, GLUCOVANCE, AVANDAMET, RIOMET, FORTAMET, [place] MET, JANUMET, GLUMETZA or METAGLIP, you MAY be asked to HOLD this medication 48 hours AFTER the exam.   The purpose of you drinking the oral contrast is to aid in the visualization of your intestinal tract. The contrast solution may cause some diarrhea. Depending on your individual set of symptoms, you may also receive an intravenous injection of x-ray contrast/dye. Plan on being at [facility] for 45 minutes or longer, depending on the type of exam you are having performed.   If you have any questions regarding your exam or if you need to reschedule, you may call [facility] at [phone] between the hours of 8:00 am and 5:00 pm, Monday-Friday.     You may take any medications as prescribed with a small amount of water, if necessary. If you take any of the following medications: METFORMIN, GLUCOPHAGE, GLUCOVANCE, AVANDAMET, RIOMET, FORTAMET, [place] MET, JANUMET, GLUMETZA or METAGLIP, you MAY be asked to HOLD this medication 48 hours AFTER the exam.   The  purpose of you drinking the oral contrast is to aid in the visualization of your intestinal tract. The contrast solution may cause some diarrhea. Depending on your individual set of symptoms, you may also receive an intravenous injection of x-ray contrast/dye. Plan on being at [facility] for 45 minutes or longer, depending on the type of exam you are having performed.   If you have any questions regarding your exam or if you need to reschedule, you may call [facility] at [phone] between the hours of 8:00 am and 5:00 pm, Monday-Friday.   Go to the emergency room if you develop confusion or severe abdominal pain.  Thank you for trusting me with your gastrointestinal care!   Carl Best, CRNP

## 2022-02-01 ENCOUNTER — Ambulatory Visit (HOSPITAL_BASED_OUTPATIENT_CLINIC_OR_DEPARTMENT_OTHER): Payer: Managed Care, Other (non HMO)

## 2022-02-01 ENCOUNTER — Encounter: Payer: Self-pay | Admitting: Gastroenterology

## 2022-02-01 ENCOUNTER — Ambulatory Visit (HOSPITAL_BASED_OUTPATIENT_CLINIC_OR_DEPARTMENT_OTHER)
Admission: RE | Admit: 2022-02-01 | Discharge: 2022-02-01 | Disposition: A | Discharge status: Home / Self Care | Payer: Managed Care, Other (non HMO) | Source: Ambulatory Visit | Attending: Nurse Practitioner | Admitting: Nurse Practitioner

## 2022-02-01 ENCOUNTER — Ambulatory Visit (HOSPITAL_BASED_OUTPATIENT_CLINIC_OR_DEPARTMENT_OTHER): Payer: 59

## 2022-02-01 DIAGNOSIS — R1032 Left lower quadrant pain: Secondary | ICD-10-CM | POA: Insufficient documentation

## 2022-02-01 DIAGNOSIS — R1012 Left upper quadrant pain: Secondary | ICD-10-CM | POA: Insufficient documentation

## 2022-02-01 DIAGNOSIS — K7460 Unspecified cirrhosis of liver: Secondary | ICD-10-CM | POA: Insufficient documentation

## 2022-02-01 DIAGNOSIS — A09 Infectious gastroenteritis and colitis, unspecified: Secondary | ICD-10-CM | POA: Diagnosis not present

## 2022-02-01 MED ORDER — IOHEXOL 300 MG/ML  SOLN
100.0000 mL | Freq: Once | INTRAMUSCULAR | Status: AC | PRN
  Administered 2022-02-01: 100 mL via INTRAVENOUS

## 2022-02-02 ENCOUNTER — Encounter: Payer: Self-pay | Admitting: Gastroenterology

## 2022-02-02 ENCOUNTER — Telehealth: Payer: Self-pay | Admitting: Nurse Practitioner

## 2022-02-02 NOTE — Telephone Encounter (Signed)
Please see notes below along with My Chart messages sent

## 2022-02-02 NOTE — Telephone Encounter (Signed)
Patient called, is requesting a nurse to call him in regards to his CT results and referral that needs to be made to [facility]. Please advise.

## 2022-02-03 ENCOUNTER — Other Ambulatory Visit: Payer: Self-pay

## 2022-02-03 DIAGNOSIS — K769 Liver disease, unspecified: Secondary | ICD-10-CM

## 2022-02-03 DIAGNOSIS — K7460 Unspecified cirrhosis of liver: Secondary | ICD-10-CM

## 2022-02-03 DIAGNOSIS — R188 Other ascites: Secondary | ICD-10-CM

## 2022-02-03 MED ORDER — FUROSEMIDE 40 MG PO TABS: 40.0000 mg | ORAL_TABLET | Freq: Every day | ORAL | 2 refills | Status: DC

## 2022-02-03 MED ORDER — SPIRONOLACTONE 50 MG PO TABS: 50.0000 mg | ORAL_TABLET | Freq: Every day | ORAL | 2 refills | Status: DC

## 2022-02-03 NOTE — Telephone Encounter (Signed)
Several inbound calls from patient. Patient is very anxious and is demanding someone call him back asap.. please advise at your earliest .  Thank you

## 2022-02-03 NOTE — Telephone Encounter (Signed)
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 He has not been compliant with low-salt diet No alcohol though  Plan: -Therapeutic US guided paracentesis. Pl send fluid for cytology, cell count, albumin, TP. -Start Lasix 40 mg p.o. daily #30, spironolactone 50 mg p.o. daily #30 -2 refills for each -Repeat CMP in 1 week -Check AFP.  Can be done at the same time -MRI liver with contrast -Please make referral to [facility] liver transplant clinic-as soon as available.  They can be there earlier if there is any cancellation. -Low-salt diet strictly  RG

## 2022-02-03 NOTE — Telephone Encounter (Signed)
Plan reviewed.

## 2022-02-03 NOTE — Telephone Encounter (Signed)
Spoke with pt.  Documented in phone note: Pt verbalized understanding with all questions answered.

## 2022-02-03 NOTE — Telephone Encounter (Signed)
Pt contacted and was made aware of all of Dr. Lyndel Safe recommendations: Paracentesis scheduled for Thursday 02/05/2022  at 9:00 AM: Arrive at 8:30 AM at [facility]. Entrance  C: Pt made aware: Orders for Lasix  and Spironolactone sent to pharmacy: Pt made aware:  Orders for labs placed. Pt made aware  Pt notified to come in 1 week to have the labs drawn: Referral to [facility] faxed along with pt records: Pt made aware: Pt notified to contact our office if he has not heard anything from there office in 2 weeks: Pt notified to remain on a low salt diet strictly  Pt verbalized understanding with all questions answered.

## 2022-02-05 ENCOUNTER — Ambulatory Visit (HOSPITAL_COMMUNITY)
Admission: RE | Admit: 2022-02-05 | Discharge: 2022-02-05 | Disposition: A | Discharge status: Home / Self Care | Payer: 59 | Source: Ambulatory Visit | Attending: Gastroenterology | Admitting: Gastroenterology

## 2022-02-05 DIAGNOSIS — R188 Other ascites: Secondary | ICD-10-CM | POA: Insufficient documentation

## 2022-02-05 DIAGNOSIS — K7460 Unspecified cirrhosis of liver: Secondary | ICD-10-CM | POA: Diagnosis present

## 2022-02-05 HISTORY — PX: IR PARACENTESIS: IMG2679

## 2022-02-05 LAB — BODY FLUID CELL COUNT WITH DIFFERENTIAL
Eos, Fluid: 0 %
Lymphs, Fluid: 42 %
Monocyte-Macrophage-Serous Fluid: 38 % — ABNORMAL LOW (ref 50–90)
Neutrophil Count, Fluid: 20 % (ref 0–25)
Total Nucleated Cell Count, Fluid: 219 cu mm (ref 0–1000)

## 2022-02-05 LAB — ALBUMIN, PLEURAL OR PERITONEAL FLUID: Albumin, Fluid: <1.5 g/dL

## 2022-02-05 MED ORDER — LIDOCAINE HCL 1 % IJ SOLN
INTRAMUSCULAR | Status: AC
  Filled 2022-02-05: qty 20

## 2022-02-05 NOTE — Procedures (Signed)
PROCEDURE SUMMARY:  Successful ultrasound guided paracentesis from the right lower quadrant.  Yielded 2 L of clear yellow fluid.  No immediate complications.  The patient tolerated the procedure well.   Specimen sent for labs.  EBL < 2 mL  If the patient eventually requires >/=2 paracenteses in a 30 day period, screening evaluation by the [facility] will be assessed.  Soyla Dryer, [place] [phone] 02/05/2022, 9:59 AM

## 2022-02-05 NOTE — Telephone Encounter (Signed)
Can proceed with referral to [facility] liver transplant clinic. MRI liver is scheduled for 02/09/2022 with contrast RG

## 2022-02-06 ENCOUNTER — Telehealth: Payer: Self-pay | Admitting: Gastroenterology

## 2022-02-06 NOTE — Telephone Encounter (Signed)
Pt's MRI has been denied by [employer]. They are recommending an alternative recommendation of [id number] ABDOMEN; without contrast material.   Please advise if this is appropriate or if we need to request an appeal or peer to peer.

## 2022-02-06 NOTE — Telephone Encounter (Signed)
We absolutely need MRI liver with and without contrast as recommended by radiology.  This is being done for abnormal CT to rule out hepatocellular carcinoma. If they want peer to peer, please set it up RG

## 2022-02-06 NOTE — Telephone Encounter (Signed)
Hello, procedure was approved after clarifying cpt codes.

## 2022-02-06 NOTE — Telephone Encounter (Signed)
Please see note below and advise  

## 2022-02-06 NOTE — Telephone Encounter (Signed)
Please see notes below and assist

## 2022-02-08 NOTE — Progress Notes (Signed)
Agree with assessment/plan.  Raj Latrena Benegas, MD [facility] [phone]  

## 2022-02-09 ENCOUNTER — Other Ambulatory Visit: Payer: Self-pay

## 2022-02-09 ENCOUNTER — Ambulatory Visit (HOSPITAL_COMMUNITY)
Admission: RE | Admit: 2022-02-09 | Discharge: 2022-02-09 | Disposition: A | Discharge status: Home / Self Care | Payer: Managed Care, Other (non HMO) | Source: Ambulatory Visit | Attending: Gastroenterology | Admitting: Gastroenterology

## 2022-02-09 DIAGNOSIS — K7460 Unspecified cirrhosis of liver: Secondary | ICD-10-CM | POA: Diagnosis not present

## 2022-02-09 DIAGNOSIS — R188 Other ascites: Secondary | ICD-10-CM | POA: Insufficient documentation

## 2022-02-09 DIAGNOSIS — K769 Liver disease, unspecified: Secondary | ICD-10-CM | POA: Insufficient documentation

## 2022-02-09 MED ORDER — GADOBUTROL 1 MMOL/ML IV SOLN
10.0000 mL | Freq: Once | INTRAVENOUS | Status: AC | PRN
  Administered 2022-02-09: 10 mL via INTRAVENOUS

## 2022-02-10 ENCOUNTER — Telehealth: Payer: Self-pay

## 2022-02-10 LAB — CYTOLOGY - NON PAP

## 2022-02-10 NOTE — Telephone Encounter (Signed)
Pt stated that he has not spoke with Dr. Lyndel Safe in over 2 months and would like to discuss with him recent imaging that has been done and results in details: Pt was scheduled for an office visit with Dr. Lyndel Safe on 02/20/2022 at 1:30 PM with Dr. Lyndel Safe : Pt made aware  Pt stated that his fluid is a lot better and feels that the fluid pills are working although pt stated that he has been having some cramping in his hands and feet and noticed that his blood vessels on his hands and feet are more  prominent and more blue than before.   Pt scheduled to come and have labs drawn tomorrow. CMP, AFP:  Pease review and advise :

## 2022-02-11 ENCOUNTER — Other Ambulatory Visit (INDEPENDENT_AMBULATORY_CARE_PROVIDER_SITE_OTHER): Payer: Managed Care, Other (non HMO)

## 2022-02-11 ENCOUNTER — Other Ambulatory Visit: Payer: Self-pay

## 2022-02-11 ENCOUNTER — Ambulatory Visit: Payer: 59 | Admitting: Gastroenterology

## 2022-02-11 DIAGNOSIS — R188 Other ascites: Secondary | ICD-10-CM | POA: Diagnosis not present

## 2022-02-11 DIAGNOSIS — K7460 Unspecified cirrhosis of liver: Secondary | ICD-10-CM

## 2022-02-11 DIAGNOSIS — K769 Liver disease, unspecified: Secondary | ICD-10-CM | POA: Diagnosis not present

## 2022-02-11 LAB — COMPREHENSIVE METABOLIC PANEL
ALT: 53 U/L (ref 0–53)
AST: 100 U/L — ABNORMAL HIGH (ref 0–37)
Albumin: 3 g/dL — ABNORMAL LOW (ref 3.5–5.2)
Alkaline Phosphatase: 234 U/L — ABNORMAL HIGH (ref 39–117)
BUN: 12 mg/dL (ref 6–23)
CO2: 26 mEq/L (ref 19–32)
Calcium: 8.5 mg/dL (ref 8.4–10.5)
Chloride: 98 mEq/L (ref 96–112)
Creatinine, Ser: 0.87 mg/dL (ref 0.40–1.50)
GFR: 90.81 mL/min (ref >60.00)
Glucose, Bld: 112 mg/dL — ABNORMAL HIGH (ref 70–99)
Potassium: 3.4 mEq/L — ABNORMAL LOW (ref 3.5–5.1)
Sodium: 132 mEq/L — ABNORMAL LOW (ref 135–145)
Total Bilirubin: 1.9 mg/dL — ABNORMAL HIGH (ref 0.2–1.2)
Total Protein: 6.2 g/dL (ref 6.0–8.3)

## 2022-02-12 ENCOUNTER — Other Ambulatory Visit: Payer: Self-pay

## 2022-02-12 DIAGNOSIS — E876 Hypokalemia: Secondary | ICD-10-CM

## 2022-02-12 MED ORDER — POTASSIUM CHLORIDE CRYS ER 20 MEQ PO TBCR: 20.0000 meq | EXTENDED_RELEASE_TABLET | Freq: Every day | ORAL | 0 refills | Status: DC

## 2022-02-13 LAB — AFP TUMOR MARKER: AFP-Tumor Marker: 6.7 ng/mL — ABNORMAL HIGH (ref <6.1)

## 2022-02-20 ENCOUNTER — Encounter: Payer: Self-pay | Admitting: Gastroenterology

## 2022-02-20 ENCOUNTER — Ambulatory Visit (INDEPENDENT_AMBULATORY_CARE_PROVIDER_SITE_OTHER): Payer: Managed Care, Other (non HMO) | Admitting: Gastroenterology

## 2022-02-20 ENCOUNTER — Other Ambulatory Visit (INDEPENDENT_AMBULATORY_CARE_PROVIDER_SITE_OTHER): Payer: Managed Care, Other (non HMO)

## 2022-02-20 VITALS — BP 120/60 | HR 72 | Ht 66.0 in | Wt 196.4 lb

## 2022-02-20 DIAGNOSIS — D509 Iron deficiency anemia, unspecified: Secondary | ICD-10-CM

## 2022-02-20 DIAGNOSIS — K7030 Alcoholic cirrhosis of liver without ascites: Secondary | ICD-10-CM | POA: Diagnosis not present

## 2022-02-20 DIAGNOSIS — K7682 Hepatic encephalopathy: Secondary | ICD-10-CM

## 2022-02-20 LAB — CBC WITH DIFFERENTIAL/PLATELET
Basophils Absolute: 0 10*3/uL (ref 0.0–0.1)
Basophils Relative: 0.7 % (ref 0.0–3.0)
Eosinophils Absolute: 0.5 10*3/uL (ref 0.0–0.7)
Eosinophils Relative: 8.8 % — ABNORMAL HIGH (ref 0.0–5.0)
HCT: 34.2 % — ABNORMAL LOW (ref 39.0–52.0)
Hemoglobin: 11.9 g/dL — ABNORMAL LOW (ref 13.0–17.0)
Lymphocytes Relative: 19.7 % (ref 12.0–46.0)
Lymphs Abs: 1.1 10*3/uL (ref 0.7–4.0)
MCHC: 34.9 g/dL (ref 30.0–36.0)
MCV: 107.1 fl — ABNORMAL HIGH (ref 78.0–100.0)
Monocytes Absolute: 1 10*3/uL (ref 0.1–1.0)
Monocytes Relative: 17.6 % — ABNORMAL HIGH (ref 3.0–12.0)
Neutro Abs: 3 10*3/uL (ref 1.4–7.7)
Neutrophils Relative %: 53.2 % (ref 43.0–77.0)
Platelets: 93 10*3/uL — ABNORMAL LOW (ref 150.0–400.0)
RBC: 3.19 Mil/uL — ABNORMAL LOW (ref 4.22–5.81)
RDW: 15.1 % (ref 11.5–15.5)
WBC: 5.6 10*3/uL (ref 4.0–10.5)

## 2022-02-20 LAB — PROTIME-INR
INR: 1.4 ratio — ABNORMAL HIGH (ref 0.8–1.0)
Prothrombin Time: 15.2 s — ABNORMAL HIGH (ref 9.6–13.1)

## 2022-02-20 LAB — COMPREHENSIVE METABOLIC PANEL
ALT: 57 U/L — ABNORMAL HIGH (ref 0–53)
AST: 100 U/L — ABNORMAL HIGH (ref 0–37)
Albumin: 3.1 g/dL — ABNORMAL LOW (ref 3.5–5.2)
Alkaline Phosphatase: 217 U/L — ABNORMAL HIGH (ref 39–117)
BUN: 14 mg/dL (ref 6–23)
CO2: 24 mEq/L (ref 19–32)
Calcium: 8.5 mg/dL (ref 8.4–10.5)
Chloride: 98 mEq/L (ref 96–112)
Creatinine, Ser: 0.74 mg/dL (ref 0.40–1.50)
GFR: 95.34 mL/min (ref >60.00)
Glucose, Bld: 66 mg/dL — ABNORMAL LOW (ref 70–99)
Potassium: 4.1 mEq/L (ref 3.5–5.1)
Sodium: 129 mEq/L — ABNORMAL LOW (ref 135–145)
Total Bilirubin: 2.2 mg/dL — ABNORMAL HIGH (ref 0.2–1.2)
Total Protein: 6.5 g/dL (ref 6.0–8.3)

## 2022-02-20 LAB — MAGNESIUM: Magnesium: 1.9 mg/dL (ref 1.5–2.5)

## 2022-02-20 NOTE — Progress Notes (Unsigned)
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  No ascites. Quit ETOH 07/2020  Subclinical hepatic encephalopathy. Didn't tolerate lactulose d/t abdominal cramps.  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  Hb 6.6 on Adm s/p 2U to 8.4. 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 Next due 11/2023  Plan:  -Has appt with [facility]. -triphasic CT in 12 weeks or MRI @ [facility]. -CBC, CMP, Mg, INR -Decrease protonix '40mg'$  po QD -Continue iron BID -Continue rifaxamin '550mg'$  po BID. -Lactulose 30cc po BID -Zinc supplements. -D/W pt extensively.   HPI:    Kristopher Ramos is a 67 y.o. male  For follow-up Doing very well from GI standpoint  Cramps  constipation  No nausea, vomiting, heartburn, regurgitation, odynophagia or dysphagia.  No significant diarrhea or constipation.  No melena or hematochezia. No unintentional weight loss. No abdominal pain.  Compliant with low salt diet.  Has been drinking plenty of fluids.  Had "UroLift" procedure for BPH.  Has not helped.  He has FU appointment with urology coming up  Most recently had MR Abdo with contrast 07/2021 which was negative for hepatoma.  Most recent AFP was normal.  No alcohol since 07/19/2020  Wt Readings from Last 3 Encounters:  02/20/22 196 lb 6.4 oz (89.1 kg)  01/30/22 215 lb (97.5 kg)  09/15/21 202 lb 4 oz (91.7 kg)    MELD 3.0: 15 at 01/30/2022  3:08 PM MELD-Na: 15 at 01/30/2022  3:08 PM Calculated from: Serum Creatinine: 0.78 mg/dL (Using min of 1 mg/dL) at 01/30/2022  3:08 PM Serum Sodium: 137 mEq/L at 01/30/2022  3:08 PM Total Bilirubin: 2.7 mg/dL at 01/30/2022  3:08 PM Serum Albumin: 2.9 g/dL at 01/30/2022  3:08 PM INR(ratio): 1.5 ratio at 01/30/2022  3:08 PM Age at listing (hypothetical): 79 years Sex: Male at 01/30/2022  3:08 PM      From previous notes: Adm to [facility] with new Dx of  acute hepatitis on chronic EtOH cirrhosis 07/2020 (s/p liver bx as below).  He was found to have microcytic anemia with heme-negative stools. Hb 6.6 s/p 2U to 8.4.  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 Rpt EGD 11/2020 with significant resolution.  Does not want to go back on lactulose.  He is having problems getting rifaximin-gets 1 week at a time.  Not getting overtly confused.  Has stopped drinking all alcohol.  Last drink 07/19/2020.  Has stopped nonsteroidals as well.  Wt Readings from Last 3 Encounters:  02/20/22 196 lb 6.4 oz (89.1 kg)  01/30/22 215 lb (97.5 kg)  09/15/21 202 lb 4 oz (91.7 kg)     Previous GI/liver work-up:  MRI Abdo with contrast 07/2021 1. No enhancing lesion within liver to suggest hepatoma. 2. Morphological changes of hepatic cirrhosis appears slightly improved from comparison exam. 3. No ascites. 4. Minimal enlargement spleen similar prior.  Last EtOH use 07/19/2020.  Liver bx 07/30/2020 -Moderate to severely active steatohepatitis (grade 2-3 of 3) with  cirrhosis (stage 4 of 4).   -neg HSV 1/2 -neg acute hep  -non reactive HBsAb 07/2020 -AFP 2/7 07/2020 -neg A1AT, ANA, AMA  EGD 07/19/2020 - GAVE. Treated with RFA followed by APC - Mild portal hypertensive gastropathy. - Gastric polyps s/p polypectomy (x 2) - No EV  EGD 12/09/2020: - Portal hypertensive gastropathy. Biopsied. - Minimal  Gastric antral vascular ectasia without bleeding. No need for rpt RFA. - A few gastric polyps.   Colon 12/09/2020 - Two 4 to 6 mm polyps in the mid transverse colon and in the distal transverse colon, removed with a cold snare. Resected and retrieved. - Mild pancolonic diverticulosis predominantly in the right colon. - Non-bleeding internal hemorrhoids. - The examination was otherwise normal on direct and retroflexion views. - Rpt in 3 yrs with 2 day prep d/t prev H/O polyps, quality of prep.  [place] 07/19/2020 Cirrhotic change of the  liver. No focal intrahepatic mass identified. No ascites.   CT AP 07/2020 1. Cirrhotic liver morphology with an area of subtle heterogeneous enhancement towards the dome. MRI abdomen with and without contrast recommended to further evaluate. 2. No intra or extrahepatic biliary duct dilatation. 3. No overt findings of portal venous hypertension.  MRI 07/2020 1. Heterogeneous signal intensity hepatic dome, corresponding with the finding on prior CT is most consistent with fibrosis. 2. Cirrhotic hepatic morphology with mild splenomegaly suggestive portal hypertension. No ascites. 3. Arterially enhancing 7 mm focus in the peripheral right lobe of the liver which demonstrates washout but without definite suspicious features, consistent with a LI-RADS category 3 hepatic lesion.  Colon - Two 4 to 6 mm polyps in the mid transverse colon and in the distal transverse colon, removed with a cold snare. Resected and retrieved. - Mild pancolonic diverticulosis predominantly in the right colon. - Non-bleeding internal hemorrhoids. - The examination was otherwise normal on direct and retroflexion views. Past Medical History:  Diagnosis Date   Alcohol abuse 07/19/2020   Benign essential hypertension 01/08/2015   Broken wrist    Generalized anxiety disorder 07/19/2020   GERD (gastroesophageal reflux disease) 07/19/2020   Mixed hyperlipidemia 09/21/2017    Past Surgical History:  Procedure Laterality Date   BIOPSY  07/19/2020   Procedure: BIOPSY;  Surgeon: Jackquline Denmark, MD;  Location: [facility] ENDOSCOPY;  Service: Endoscopy;;   BIOPSY  12/09/2020   Procedure: BIOPSY;  Surgeon: Jackquline Denmark, MD;  Location: [facility] ENDOSCOPY;  Service: Endoscopy;;   COLONOSCOPY WITH PROPOFOL N/A 12/09/2020   Procedure: COLONOSCOPY WITH PROPOFOL;  Surgeon: Jackquline Denmark, MD;  Location: [facility] ENDOSCOPY;  Service: Endoscopy;  Laterality: N/A;   CYSTOSCOPY WITH INSERTION OF UROLIFT  06/19/2021   [facility]   ESOPHAGOGASTRODUODENOSCOPY (EGD) WITH PROPOFOL N/A 07/19/2020   Procedure: ESOPHAGOGASTRODUODENOSCOPY (EGD) WITH PROPOFOL;  Surgeon: Jackquline Denmark, MD;  Location: [facility] ENDOSCOPY;  Service: Endoscopy;  Laterality: N/A;   ESOPHAGOGASTRODUODENOSCOPY (EGD) WITH PROPOFOL N/A 12/09/2020   Procedure: ESOPHAGOGASTRODUODENOSCOPY (EGD) WITH PROPOFOL;  Surgeon: Jackquline Denmark, MD;  Location: [facility] ENDOSCOPY;  Service: Endoscopy;  Laterality: N/A;   GI RADIOFREQUENCY ABLATION  07/19/2020   Procedure: GI RADIOFREQUENCY ABLATION;  Surgeon: Jackquline Denmark, MD;  Location[facility] ENDOSCOPY;  Service: Endoscopy;;   IR PARACENTESIS  02/05/2022   POLYPECTOMY  07/19/2020   Procedure: POLYPECTOMY;  Surgeon: Jackquline Denmark, MD;  Location: [facility] ENDOSCOPY;  Service: Endoscopy;;   POLYPECTOMY  12/09/2020   Procedure: POLYPECTOMY;  Surgeon: Jackquline Denmark, MD;  Location: [facility] ENDOSCOPY;  Service: Endoscopy;;   WRIST ARTHROCENTESIS  10/2021    Family History  Problem Relation Age of Onset   High blood pressure Mother    Dementia Mother    Ulcerative colitis Father    Stomach cancer Neg Hx    Colon cancer Neg Hx    Pancreatic cancer Neg Hx     Social History   Tobacco Use  Smoking status: Never   Smokeless tobacco: Current    Types: Snuff  Vaping Use   Vaping Use: Never used  Substance Use Topics   Alcohol use: Not Currently    Current Outpatient Medications  Medication Sig Dispense Refill   citalopram (CELEXA) 40 MG tablet Take 40 mg by mouth daily.     EPINEPHrine 0.3 mg/0.3 mL IJ SOAJ injection Inject 0.3 mg into the muscle as needed for anaphylaxis.     ferrous sulfate 325 (65 FE) MG tablet Take 1 tablet (325 mg total) by mouth 2 (two) times daily. 299 tablet 1   folic acid (FOLVITE) 1 MG tablet Take 1 tablet (1 mg total) by mouth daily. 90 tablet 4   furosemide (LASIX) 40 MG tablet Take 1 tablet (40 mg total) by mouth daily. 30 tablet 2   hydrOXYzine (ATARAX/VISTARIL) 10 MG tablet Take 1  tablet (10 mg total) by mouth 3 (three) times daily. (Patient taking differently: Take 10 mg by mouth daily.) 90 tablet 0   pantoprazole (PROTONIX) 40 MG tablet Take 1 tablet (40 mg total) by mouth 2 (two) times daily. 60 tablet 3   rifaximin (XIFAXAN) 550 MG TABS tablet Take 1 tablet (550 mg total) by mouth 2 (two) times daily. 60 tablet 3   spironolactone (ALDACTONE) 50 MG tablet Take 1 tablet (50 mg total) by mouth daily. 30 tablet 2   thiamine 100 MG tablet Take 1 tablet (100 mg total) by mouth daily. 30 tablet 0   potassium chloride SA (KLOR-CON M) 20 MEQ tablet Take 1 tablet (20 mEq total) by mouth daily for 7 days. 7 tablet 0   No current facility-administered medications for this visit.    Allergies  Allergen Reactions   Yellow Jacket Venom Anaphylaxis and Shortness Of Breath    Review of Systems:  neg     Physical Exam:    BP 120/60   Pulse 72   Ht '5\' 6"'$  (1.676 m)   Wt 196 lb 6.4 oz (89.1 kg)   BMI 31.70 kg/m  Wt Readings from Last 3 Encounters:  02/20/22 196 lb 6.4 oz (89.1 kg)  01/30/22 215 lb (97.5 kg)  09/15/21 202 lb 4 oz (91.7 kg)   Constitutional:  Well-developed, in no acute distress. Psychiatric: Normal mood and affect. Behavior is normal. HEENT: Pupils normal.  Conjunctivae are normal. No scleral icterus. Cardiovascular: Normal rate, regular rhythm. No edema Pulmonary/chest: Effort normal and breath sounds normal. No wheezing, rales or rhonchi. Abdominal: Soft, nondistended. Nontender. Bowel sounds active throughout. There are no masses palpable. No hepatomegaly. Rectal: Deferred Neurological: Alert and oriented to person place and time. Skin: Skin is warm and dry. No rashes noted.  Data Reviewed: I have personally reviewed following labs and imaging studies  CBC:    Latest Ref Rng & Units 01/30/2022    3:08 PM 08/11/2021   10:03 AM 03/05/2021    9:50 AM  CBC  WBC 4.0 - 10.5 K/uL 5.7  5.1  4.9   Hemoglobin 13.0 - 17.0 g/dL 10.5  11.7  13.9    Hematocrit 39.0 - 52.0 % 31.4  34.9  40.7   Platelets 150.0 - 400.0 K/uL 94.0  93.0  97.0     CMP:    Latest Ref Rng & Units 02/11/2022    1:49 PM 01/30/2022    3:08 PM 08/11/2021   10:03 AM  CMP  Glucose 70 - 99 mg/dL 112  89  110   BUN 6 - 23 mg/dL  $'12  8  8   'Z$ Creatinine 0.40 - 1.50 mg/dL 0.87  0.78  0.97   Sodium 135 - 145 mEq/L 132  137  139   Potassium 3.5 - 5.1 mEq/L 3.4  4.3  4.1   Chloride 96 - 112 mEq/L 98  107  107   CO2 19 - 32 mEq/L '26  24  23   '$ Calcium 8.4 - 10.5 mg/dL 8.5  8.4  8.7   Total Protein 6.0 - 8.3 g/dL 6.2  5.9  6.3   Total Bilirubin 0.2 - 1.2 mg/dL 1.9  2.7  1.6   Alkaline Phos 39 - 117 U/L 234  165  194   AST 0 - 37 U/L 100  80  61   ALT 0 - 53 U/L 53  40  29         Carmell Austria, MD 02/20/2022, 1:30 PM  Cc: Kristopher Glee., MD

## 2022-02-20 NOTE — Patient Instructions (Addendum)
_______________________________________________________  If your blood pressure at your visit was 140/90 or greater, please contact your primary care physician to follow up on this.  _______________________________________________________  If you are age 66 or older, your body mass index should be between 23-30. Your Body mass index is 31.7 kg/m. If this is out of the aforementioned range listed, please consider follow up with your Primary Care Provider.  If you are age 39 or younger, your body mass index should be between 19-25. Your Body mass index is 31.7 kg/m. If this is out of the aformentioned range listed, please consider follow up with your Primary Care Provider.   ________________________________________________________  The [facility] providers would like to encourage you to use [facility] to communicate with providers for non-urgent requests or questions.  Due to long hold times on the telephone, sending your provider a message by [facility] may be a faster and more efficient way to get a response.  Please allow 48 business hours for a response.  Please remember that this is for non-urgent requests.  _______________________________________________________  Your provider has requested that you go to the basement level for lab work before leaving today. Press "B" on the elevator. The lab is located at the first door on the left as you exit the elevator.  Continue Iron 2 times a day and Decrease Protonix to '40mg'$  daily and Continue Xifaxan 2 times a day and Lactulose 47m 2 times a day.   Keep appointment with [facility]  Thank you,  Dr. RJackquline Denmark

## 2022-02-23 ENCOUNTER — Other Ambulatory Visit: Payer: Self-pay

## 2022-02-23 DIAGNOSIS — E871 Hypo-osmolality and hyponatremia: Secondary | ICD-10-CM

## 2022-02-24 ENCOUNTER — Telehealth: Payer: Self-pay | Admitting: Gastroenterology

## 2022-02-24 NOTE — Telephone Encounter (Signed)
Report faxed as requested

## 2022-02-24 NOTE — Telephone Encounter (Signed)
Inbound call from [facility] with [facility] stating that she needed to have patients colon\egd path report sent over to their office.   Fax:[phone]

## 2022-02-25 ENCOUNTER — Encounter: Payer: Self-pay | Admitting: Gastroenterology

## 2022-02-25 MED ORDER — LACTULOSE 10 GM/15ML PO SOLN: 20.0000 g | Freq: Two times a day (BID) | ORAL | 6 refills | Status: DC

## 2022-02-26 ENCOUNTER — Telehealth: Payer: Self-pay | Admitting: Gastroenterology

## 2022-02-26 NOTE — Telephone Encounter (Signed)
Inbound call from pt, requesting to speak with a nurse , pt states he is feeling extremely sick..Please advise

## 2022-02-26 NOTE — Telephone Encounter (Signed)
Left message for pt to call back  °

## 2022-02-27 NOTE — Telephone Encounter (Signed)
Pt stated that he is continuing to have cramping in his legs, feet, and hands, and seems to be worse at night.   Pt stated that the Lactose gives him lots of abdominal pain and stated that instead of him taking the prescribed dose that he dose 1/2 in the morning and 1/2 at night.  Please advise

## 2022-03-02 NOTE — Telephone Encounter (Signed)
Pt stated that he is following up from the [facility] on Friday in regard to the cramping  in his legs, feet, and hands, and seems to be worse at night. Pt stated that he feels that he may be dehydrated as well. Pt is voiding. On diuretics. Please see notes below as well:  Please advise

## 2022-03-02 NOTE — Telephone Encounter (Signed)
Patient called requesting to speak with you states he is still having a lot of cramping and the medication given is not helping. He feels like he is dehydrated and is seeking further advise.

## 2022-03-05 NOTE — Telephone Encounter (Signed)
Pt made aware of Dr. Lyndel Safe recommendations:  Pt stated that the Lactose gives him lots of abdominal pain and stated that instead of him taking the prescribed dose that he dose 1/2 in the morning and 1/2 at night.  Please advise

## 2022-03-05 NOTE — Telephone Encounter (Signed)
No problems. RG

## 2022-03-05 NOTE — Telephone Encounter (Signed)
Pt made aware of Dr. Lyndel Safe response. Pt verbalized understanding with all questions answered.

## 2022-03-05 NOTE — Telephone Encounter (Signed)
Can try low-dose magnesium 200 mg p.o. with dinner to see if it helps with cramps The wise will try pickle juice-shown to improve cramps RG

## 2022-03-07 ENCOUNTER — Encounter: Payer: Self-pay | Admitting: Gastroenterology

## 2022-03-09 ENCOUNTER — Telehealth: Payer: Self-pay

## 2022-03-09 ENCOUNTER — Other Ambulatory Visit: Payer: Self-pay

## 2022-03-09 DIAGNOSIS — R188 Other ascites: Secondary | ICD-10-CM

## 2022-03-09 NOTE — Telephone Encounter (Signed)
Spoke with pt. Documented in phone note 

## 2022-03-09 NOTE — Telephone Encounter (Signed)
Pt made aware of Dr. Lyndel Safe recommendations: Pt was ordered and scheduled for a paracentesis at [facility] on 03/11/2022 at 10:00 AM. Pt to arrive at 9:30 AM Entrance C. Pt made aware Orders for albumin paced. Pt verbalized understanding with all questions answered.

## 2022-03-09 NOTE — Telephone Encounter (Signed)
-   Proceed with US guided LVP (max 10 liters)  If above 5 liters, give 25% at 25 g of albumin IV  If above 7 liters, give 25% at 50 g of albumin IV. If there is prior radiology protocol in place, then please disregard above and go by protocol. -Sent ascitic fluid for cell count, albumin, total protein and cytology. -If fluid is turbid send for culture.   RG

## 2022-03-09 NOTE — Telephone Encounter (Signed)
Called pt in regard to my chart message sent by pt. Pt stated that he feels like his stomach is distended, pushing up on his diaphragm, swelling, gained 15 lbs last week. Some SOB. ? If he needs paracentesis: Please advise:

## 2022-03-11 ENCOUNTER — Ambulatory Visit (HOSPITAL_COMMUNITY)
Admission: RE | Admit: 2022-03-11 | Discharge: 2022-03-11 | Disposition: A | Discharge status: Home / Self Care | Payer: Managed Care, Other (non HMO) | Source: Ambulatory Visit | Attending: Gastroenterology | Admitting: Gastroenterology

## 2022-03-11 DIAGNOSIS — K7460 Unspecified cirrhosis of liver: Secondary | ICD-10-CM | POA: Insufficient documentation

## 2022-03-11 DIAGNOSIS — R188 Other ascites: Secondary | ICD-10-CM | POA: Insufficient documentation

## 2022-03-11 HISTORY — PX: IR PARACENTESIS: IMG2679

## 2022-03-11 LAB — BODY FLUID CELL COUNT WITH DIFFERENTIAL
Eos, Fluid: 0 %
Lymphs, Fluid: 27 %
Monocyte-Macrophage-Serous Fluid: 46 % — ABNORMAL LOW (ref 50–90)
Neutrophil Count, Fluid: 27 % — ABNORMAL HIGH (ref 0–25)
Total Nucleated Cell Count, Fluid: 350 cu mm (ref 0–1000)

## 2022-03-11 LAB — PROTEIN, PLEURAL OR PERITONEAL FLUID: Total protein, fluid: <3 g/dL

## 2022-03-11 LAB — ALBUMIN, PLEURAL OR PERITONEAL FLUID: Albumin, Fluid: <1.5 g/dL

## 2022-03-11 MED ORDER — LIDOCAINE HCL 1 % IJ SOLN
INTRAMUSCULAR | Status: AC
  Administered 2022-03-11: 10 mL
  Filled 2022-03-11: qty 20

## 2022-03-11 NOTE — Procedures (Signed)
PROCEDURE SUMMARY:  Successful ultrasound guided paracentesis from the right lower quadrant.  Yielded 900 ml of clear yellow fluid.  No immediate complications.  The patient tolerated the procedure well.   Specimen sent for labs.  EBL < 2 mL  If the patient eventually requires >/=2 paracenteses in a 30 day period, screening evaluation by the [facility] will be assessed.  Soyla Dryer,  [phone] 03/11/2022, 12:59 PM

## 2022-03-12 ENCOUNTER — Telehealth: Payer: Self-pay | Admitting: Gastroenterology

## 2022-03-12 NOTE — Telephone Encounter (Signed)
Patient is calling states he is having severe cramps and would like to speak to the nurse. Please advise

## 2022-03-12 NOTE — Telephone Encounter (Signed)
Pt stated that he is having ongoing severe cramps in his hands, feet, legs,and hands.  Pt states that the are severe. Please advise

## 2022-03-13 ENCOUNTER — Encounter: Payer: Self-pay | Admitting: Gastroenterology

## 2022-03-13 ENCOUNTER — Other Ambulatory Visit (INDEPENDENT_AMBULATORY_CARE_PROVIDER_SITE_OTHER): Payer: Managed Care, Other (non HMO)

## 2022-03-13 DIAGNOSIS — E871 Hypo-osmolality and hyponatremia: Secondary | ICD-10-CM | POA: Diagnosis not present

## 2022-03-13 LAB — BASIC METABOLIC PANEL
BUN: 13 mg/dL (ref 6–23)
CO2: 25 mEq/L (ref 19–32)
Calcium: 8 mg/dL — ABNORMAL LOW (ref 8.4–10.5)
Chloride: 97 mEq/L (ref 96–112)
Creatinine, Ser: 0.8 mg/dL (ref 0.40–1.50)
GFR: 93.08 mL/min (ref >60.00)
Glucose, Bld: 151 mg/dL — ABNORMAL HIGH (ref 70–99)
Potassium: 3.5 mEq/L (ref 3.5–5.1)
Sodium: 129 mEq/L — ABNORMAL LOW (ref 135–145)

## 2022-03-14 LAB — CYTOLOGY - NON PAP

## 2022-03-16 ENCOUNTER — Encounter (HOSPITAL_BASED_OUTPATIENT_CLINIC_OR_DEPARTMENT_OTHER): Payer: Self-pay

## 2022-03-16 ENCOUNTER — Emergency Department (HOSPITAL_BASED_OUTPATIENT_CLINIC_OR_DEPARTMENT_OTHER): Payer: 59

## 2022-03-16 ENCOUNTER — Inpatient Hospital Stay (HOSPITAL_BASED_OUTPATIENT_CLINIC_OR_DEPARTMENT_OTHER)
Admission: EM | Admit: 2022-03-16 | Discharge: 2022-03-20 | DRG: 433 | Disposition: A | Discharge status: Home / Self Care | Payer: 59 | Attending: Family Medicine | Admitting: Family Medicine

## 2022-03-16 ENCOUNTER — Telehealth: Payer: Self-pay

## 2022-03-16 DIAGNOSIS — E782 Mixed hyperlipidemia: Secondary | ICD-10-CM | POA: Diagnosis present

## 2022-03-16 DIAGNOSIS — Z720 Tobacco use: Secondary | ICD-10-CM

## 2022-03-16 DIAGNOSIS — D6959 Other secondary thrombocytopenia: Secondary | ICD-10-CM | POA: Diagnosis present

## 2022-03-16 DIAGNOSIS — Z888 Allergy status to other drugs, medicaments and biological substances status: Secondary | ICD-10-CM

## 2022-03-16 DIAGNOSIS — K3189 Other diseases of stomach and duodenum: Secondary | ICD-10-CM | POA: Diagnosis present

## 2022-03-16 DIAGNOSIS — E876 Hypokalemia: Secondary | ICD-10-CM

## 2022-03-16 DIAGNOSIS — E871 Hypo-osmolality and hyponatremia: Secondary | ICD-10-CM | POA: Diagnosis present

## 2022-03-16 DIAGNOSIS — Z79899 Other long term (current) drug therapy: Secondary | ICD-10-CM

## 2022-03-16 DIAGNOSIS — K31819 Angiodysplasia of stomach and duodenum without bleeding: Secondary | ICD-10-CM | POA: Diagnosis present

## 2022-03-16 DIAGNOSIS — I10 Essential (primary) hypertension: Secondary | ICD-10-CM | POA: Diagnosis present

## 2022-03-16 DIAGNOSIS — Z91030 Bee allergy status: Secondary | ICD-10-CM

## 2022-03-16 DIAGNOSIS — R195 Other fecal abnormalities: Secondary | ICD-10-CM

## 2022-03-16 DIAGNOSIS — K5909 Other constipation: Secondary | ICD-10-CM | POA: Diagnosis present

## 2022-03-16 DIAGNOSIS — R1012 Left upper quadrant pain: Secondary | ICD-10-CM

## 2022-03-16 DIAGNOSIS — R7303 Prediabetes: Secondary | ICD-10-CM | POA: Diagnosis present

## 2022-03-16 DIAGNOSIS — K766 Portal hypertension: Secondary | ICD-10-CM | POA: Diagnosis present

## 2022-03-16 DIAGNOSIS — K7682 Hepatic encephalopathy: Secondary | ICD-10-CM | POA: Diagnosis present

## 2022-03-16 DIAGNOSIS — K7460 Unspecified cirrhosis of liver: Secondary | ICD-10-CM

## 2022-03-16 DIAGNOSIS — G4700 Insomnia, unspecified: Secondary | ICD-10-CM | POA: Diagnosis present

## 2022-03-16 DIAGNOSIS — Z820 Family history of epilepsy and other diseases of the nervous system: Secondary | ICD-10-CM

## 2022-03-16 DIAGNOSIS — Z8249 Family history of ischemic heart disease and other diseases of the circulatory system: Secondary | ICD-10-CM

## 2022-03-16 DIAGNOSIS — I8510 Secondary esophageal varices without bleeding: Secondary | ICD-10-CM | POA: Diagnosis present

## 2022-03-16 DIAGNOSIS — K7031 Alcoholic cirrhosis of liver with ascites: Secondary | ICD-10-CM | POA: Diagnosis not present

## 2022-03-16 DIAGNOSIS — F1011 Alcohol abuse, in remission: Secondary | ICD-10-CM | POA: Diagnosis present

## 2022-03-16 DIAGNOSIS — K7290 Hepatic failure, unspecified without coma: Secondary | ICD-10-CM | POA: Diagnosis not present

## 2022-03-16 DIAGNOSIS — F411 Generalized anxiety disorder: Secondary | ICD-10-CM | POA: Diagnosis present

## 2022-03-16 DIAGNOSIS — D696 Thrombocytopenia, unspecified: Secondary | ICD-10-CM

## 2022-03-16 DIAGNOSIS — K219 Gastro-esophageal reflux disease without esophagitis: Secondary | ICD-10-CM | POA: Diagnosis present

## 2022-03-16 DIAGNOSIS — D62 Acute posthemorrhagic anemia: Secondary | ICD-10-CM | POA: Diagnosis present

## 2022-03-16 DIAGNOSIS — E669 Obesity, unspecified: Secondary | ICD-10-CM | POA: Diagnosis present

## 2022-03-16 DIAGNOSIS — Z6832 Body mass index (BMI) 32.0-32.9, adult: Secondary | ICD-10-CM

## 2022-03-16 DIAGNOSIS — K922 Gastrointestinal hemorrhage, unspecified: Secondary | ICD-10-CM

## 2022-03-16 LAB — BASIC METABOLIC PANEL
Anion gap: 7 (ref 5–15)
BUN: 11 mg/dL (ref 8–23)
CO2: 23 mmol/L (ref 22–32)
Calcium: 8.1 mg/dL — ABNORMAL LOW (ref 8.9–10.3)
Chloride: 97 mmol/L — ABNORMAL LOW (ref 98–111)
Creatinine, Ser: 0.71 mg/dL (ref 0.61–1.24)
GFR, Estimated: >60 mL/min (ref >60)
Glucose, Bld: 155 mg/dL — ABNORMAL HIGH (ref 70–99)
Potassium: 4.2 mmol/L (ref 3.5–5.1)
Sodium: 127 mmol/L — ABNORMAL LOW (ref 135–145)

## 2022-03-16 LAB — CBC WITH DIFFERENTIAL/PLATELET
Abs Immature Granulocytes: 0.03 10*3/uL (ref 0.00–0.07)
Basophils Absolute: 0.1 10*3/uL (ref 0.0–0.1)
Basophils Relative: 1 %
Eosinophils Absolute: 0.2 10*3/uL (ref 0.0–0.5)
Eosinophils Relative: 4 %
HCT: 29.3 % — ABNORMAL LOW (ref 39.0–52.0)
Hemoglobin: 10.1 g/dL — ABNORMAL LOW (ref 13.0–17.0)
Immature Granulocytes: 1 %
Lymphocytes Relative: 17 %
Lymphs Abs: 1 10*3/uL (ref 0.7–4.0)
MCH: 35.7 pg — ABNORMAL HIGH (ref 26.0–34.0)
MCHC: 34.5 g/dL (ref 30.0–36.0)
MCV: 103.5 fL — ABNORMAL HIGH (ref 80.0–100.0)
Monocytes Absolute: 1 10*3/uL (ref 0.1–1.0)
Monocytes Relative: 18 %
Neutro Abs: 3.3 10*3/uL (ref 1.7–7.7)
Neutrophils Relative %: 59 %
Platelets: 117 10*3/uL — ABNORMAL LOW (ref 150–400)
RBC: 2.83 MIL/uL — ABNORMAL LOW (ref 4.22–5.81)
RDW: 14 % (ref 11.5–15.5)
WBC: 5.6 10*3/uL (ref 4.0–10.5)
nRBC: 0 % (ref 0.0–0.2)

## 2022-03-16 LAB — COMPREHENSIVE METABOLIC PANEL
ALT: 80 U/L — ABNORMAL HIGH (ref 0–44)
AST: 135 U/L — ABNORMAL HIGH (ref 15–41)
Albumin: 2.5 g/dL — ABNORMAL LOW (ref 3.5–5.0)
Alkaline Phosphatase: 223 U/L — ABNORMAL HIGH (ref 38–126)
Anion gap: 5 (ref 5–15)
BUN: 13 mg/dL (ref 8–23)
CO2: 24 mmol/L (ref 22–32)
Calcium: 7.8 mg/dL — ABNORMAL LOW (ref 8.9–10.3)
Chloride: 95 mmol/L — ABNORMAL LOW (ref 98–111)
Creatinine, Ser: 0.89 mg/dL (ref 0.61–1.24)
GFR, Estimated: >60 mL/min (ref >60)
Glucose, Bld: 116 mg/dL — ABNORMAL HIGH (ref 70–99)
Potassium: 3.6 mmol/L (ref 3.5–5.1)
Sodium: 124 mmol/L — ABNORMAL LOW (ref 135–145)
Total Bilirubin: 2.4 mg/dL — ABNORMAL HIGH (ref 0.3–1.2)
Total Protein: 5.8 g/dL — ABNORMAL LOW (ref 6.5–8.1)

## 2022-03-16 LAB — URINALYSIS, ROUTINE W REFLEX MICROSCOPIC
Bilirubin Urine: NEGATIVE
Glucose, UA: NEGATIVE mg/dL
Hgb urine dipstick: NEGATIVE
Ketones, ur: NEGATIVE mg/dL
Leukocytes,Ua: NEGATIVE
Nitrite: NEGATIVE
Protein, ur: NEGATIVE mg/dL
Specific Gravity, Urine: 1.01 (ref 1.005–1.030)
pH: 5.5 (ref 5.0–8.0)

## 2022-03-16 LAB — TROPONIN I (HIGH SENSITIVITY)
Troponin I (High Sensitivity): 6 ng/L (ref <18)
Troponin I (High Sensitivity): 6 ng/L (ref <18)

## 2022-03-16 LAB — LACTIC ACID, PLASMA: Lactic Acid, Venous: 1.8 mmol/L (ref 0.5–1.9)

## 2022-03-16 LAB — PROTIME-INR
INR: 1.3 — ABNORMAL HIGH (ref 0.8–1.2)
Prothrombin Time: 16.3 seconds — ABNORMAL HIGH (ref 11.4–15.2)

## 2022-03-16 LAB — LIPASE, BLOOD: Lipase: 71 U/L — ABNORMAL HIGH (ref 11–51)

## 2022-03-16 LAB — AMMONIA: Ammonia: 52 umol/L — ABNORMAL HIGH (ref 9–35)

## 2022-03-16 LAB — GLUCOSE, CAPILLARY: Glucose-Capillary: 127 mg/dL — ABNORMAL HIGH (ref 70–99)

## 2022-03-16 LAB — MAGNESIUM: Magnesium: 2 mg/dL (ref 1.7–2.4)

## 2022-03-16 MED ORDER — SODIUM CHLORIDE 0.9 % IV SOLN
2.0000 g | INTRAVENOUS | Status: DC
  Administered 2022-03-16 – 2022-03-19 (×4): 2 g via INTRAVENOUS
  Filled 2022-03-16 (×4): qty 20

## 2022-03-16 MED ORDER — PANTOPRAZOLE SODIUM 40 MG PO TBEC
40.0000 mg | DELAYED_RELEASE_TABLET | Freq: Two times a day (BID) | ORAL | Status: DC
  Administered 2022-03-16 – 2022-03-20 (×7): 40 mg via ORAL
  Filled 2022-03-16 (×7): qty 1

## 2022-03-16 MED ORDER — CITALOPRAM HYDROBROMIDE 20 MG PO TABS
40.0000 mg | ORAL_TABLET | Freq: Every day | ORAL | Status: DC
  Administered 2022-03-17: 40 mg via ORAL
  Filled 2022-03-16: qty 2

## 2022-03-16 MED ORDER — POTASSIUM CHLORIDE CRYS ER 20 MEQ PO TBCR
20.0000 meq | EXTENDED_RELEASE_TABLET | Freq: Every day | ORAL | Status: DC
  Administered 2022-03-17 – 2022-03-20 (×3): 20 meq via ORAL
  Filled 2022-03-16 (×3): qty 1

## 2022-03-16 MED ORDER — FENTANYL CITRATE PF 50 MCG/ML IJ SOSY
25.0000 ug | PREFILLED_SYRINGE | Freq: Once | INTRAMUSCULAR | Status: AC
  Administered 2022-03-16: 25 ug via INTRAVENOUS
  Filled 2022-03-16: qty 1

## 2022-03-16 MED ORDER — RIFAXIMIN 550 MG PO TABS
550.0000 mg | ORAL_TABLET | Freq: Two times a day (BID) | ORAL | Status: DC
  Administered 2022-03-16 – 2022-03-20 (×7): 550 mg via ORAL
  Filled 2022-03-16 (×9): qty 1

## 2022-03-16 MED ORDER — SPIRONOLACTONE 25 MG PO TABS
50.0000 mg | ORAL_TABLET | Freq: Every day | ORAL | Status: DC
  Administered 2022-03-16: 50 mg via ORAL
  Filled 2022-03-16: qty 2

## 2022-03-16 MED ORDER — ONDANSETRON HCL 4 MG PO TABS: 4.0000 mg | ORAL_TABLET | Freq: Four times a day (QID) | ORAL | Status: DC | PRN

## 2022-03-16 MED ORDER — ALBUMIN HUMAN 25 % IV SOLN
50.0000 g | Freq: Once | INTRAVENOUS | Status: AC
  Administered 2022-03-16: 50 g via INTRAVENOUS
  Filled 2022-03-16: qty 200

## 2022-03-16 MED ORDER — THIAMINE MONONITRATE 100 MG PO TABS
100.0000 mg | ORAL_TABLET | Freq: Every day | ORAL | Status: DC
  Administered 2022-03-16 – 2022-03-20 (×4): 100 mg via ORAL
  Filled 2022-03-16 (×8): qty 1

## 2022-03-16 MED ORDER — HYDROXYZINE HCL 10 MG PO TABS
10.0000 mg | ORAL_TABLET | Freq: Every day | ORAL | Status: DC
  Administered 2022-03-17 – 2022-03-20 (×3): 10 mg via ORAL
  Filled 2022-03-16 (×3): qty 1

## 2022-03-16 MED ORDER — ONDANSETRON HCL 4 MG/2ML IJ SOLN: 4.0000 mg | Freq: Four times a day (QID) | INTRAMUSCULAR | Status: DC | PRN

## 2022-03-16 MED ORDER — ACETAMINOPHEN 325 MG PO TABS
650.0000 mg | ORAL_TABLET | Freq: Four times a day (QID) | ORAL | Status: DC | PRN
  Administered 2022-03-17 – 2022-03-18 (×2): 650 mg via ORAL
  Filled 2022-03-16 (×2): qty 2

## 2022-03-16 MED ORDER — FOLIC ACID 1 MG PO TABS
1.0000 mg | ORAL_TABLET | Freq: Every day | ORAL | Status: DC
  Administered 2022-03-16 – 2022-03-20 (×4): 1 mg via ORAL
  Filled 2022-03-16 (×4): qty 1

## 2022-03-16 MED ORDER — FERROUS SULFATE 325 (65 FE) MG PO TABS
325.0000 mg | ORAL_TABLET | Freq: Every day | ORAL | Status: DC
  Administered 2022-03-17 – 2022-03-20 (×3): 325 mg via ORAL
  Filled 2022-03-16 (×4): qty 1

## 2022-03-16 MED ORDER — FENTANYL CITRATE PF 50 MCG/ML IJ SOSY
25.0000 ug | PREFILLED_SYRINGE | INTRAMUSCULAR | Status: AC | PRN
  Administered 2022-03-16 – 2022-03-17 (×3): 25 ug via INTRAVENOUS
  Filled 2022-03-16 (×3): qty 1

## 2022-03-16 MED ORDER — LACTULOSE 10 GM/15ML PO SOLN
10.0000 g | Freq: Two times a day (BID) | ORAL | Status: DC
  Administered 2022-03-16 – 2022-03-20 (×7): 10 g via ORAL
  Filled 2022-03-16 (×7): qty 15

## 2022-03-16 MED ORDER — ACETAMINOPHEN 650 MG RE SUPP: 650.0000 mg | Freq: Four times a day (QID) | RECTAL | Status: DC | PRN

## 2022-03-16 MED ORDER — SODIUM CHLORIDE 0.9 % IV SOLN
1.0000 g | Freq: Once | INTRAVENOUS | Status: AC
  Administered 2022-03-16: 1 g via INTRAVENOUS
  Filled 2022-03-16: qty 10

## 2022-03-16 MED ORDER — POTASSIUM CHLORIDE CRYS ER 20 MEQ PO TBCR: 20.0000 meq | EXTENDED_RELEASE_TABLET | Freq: Every day | ORAL | 4 refills | Status: DC

## 2022-03-16 MED ORDER — FUROSEMIDE 10 MG/ML IJ SOLN
20.0000 mg | Freq: Once | INTRAMUSCULAR | Status: AC
  Administered 2022-03-16: 20 mg via INTRAVENOUS
  Filled 2022-03-16: qty 2

## 2022-03-16 MED ORDER — POTASSIUM CHLORIDE CRYS ER 20 MEQ PO TBCR
40.0000 meq | EXTENDED_RELEASE_TABLET | Freq: Once | ORAL | Status: AC
  Administered 2022-03-16: 40 meq via ORAL
  Filled 2022-03-16: qty 2

## 2022-03-16 MED ORDER — IOHEXOL 300 MG/ML  SOLN
100.0000 mL | Freq: Once | INTRAMUSCULAR | Status: AC | PRN
  Administered 2022-03-16: 100 mL via INTRAVENOUS

## 2022-03-16 NOTE — ED Notes (Signed)
States for the past few days has abd pain and cramping, has had a lot of nausea, no vomiting. No diarrhea. Appetite is poor per pt statement. Abd is very distended and taught. BS are active. Color WNL, GCS 15

## 2022-03-16 NOTE — ED Notes (Signed)
Patient transported to X-ray via w/c 

## 2022-03-16 NOTE — ED Notes (Signed)
ED Provider at bedside. 

## 2022-03-16 NOTE — H&P (Signed)
History and Physical    Patient: Kristopher Ramos [id number] DOB: October 19, 1955 DOA: 03/16/2022 DOS: the patient was seen and examined on 03/16/2022 PCP: Kristopher Glee., MD  Patient coming from: Home  Chief Complaint:  Chief Complaint  Patient presents with   Abdominal Pain   HPI: Kristopher Ramos is a 67 y.o. male with medical history significant of anxiety, prediabetes, hypertension, wrist fracture, generalized anxiety disorder, GERD, mixed anemia, history of alcohol abuse in remission, alcoholic hepatitis, alcoholic liver cirrhosis, portal hypertension who presented to the emergency department with abdominal pain and worsening ascites over the last few days.  He last had a paracentesis last week when they went through 900 mL of ascitic fluid. No emesis, constipation, melena or hematochezia.  No flank pain, dysuria, frequency or hematuria.He denied fever, chills, rhinorrhea, sore throat, wheezing or hemoptysis.  No chest pain, palpitations, diaphoresis, PND, orthopnea or pitting edema of the lower extremities.    No polyuria, polydipsia, polyphagia or blurred vision.   Lab work: Urinalysis was normal.  CBC showed a white count of 5.6, hemoglobin 10.1 g/dL with an MCV of 103.5 fL and platelets 117.  PT 16.3 INR 1.3.  Ammonia was 52 mol/L.  Lipase was 71 units/L.  Lactic acid was normal.  Troponin x 2 unremarkable.  CMP sodium 124, potassium 3.6, chloride 95 and CO2 24 mmol/L.  Renal function was normal.  Calcium normalizes after correction.  Glucose 116 and total bilirubin 2.4 mg/dL.  Total protein 5.8 and albumin 2.5 g/dL.  AST 135, ALT 80 and alkaline phosphatase 223 units/L.  Imaging: 2 view chest radiograph with mild atelectasis and elevation of the diaphragm likely due to ascites.  CT abdomen/pelvis with contrast read demonstrating cirrhosis and multiple stigmata of portal venous hypertension.  Apicitis volume slightly greater than previous CT.  Aortic atherosclerosis.  ED course: Initial  vital signs were temperature 98.3 F, pulse 78, respirations 26, BP 132/71 mmHg O2 sat 99% on room air.  The patient received ceftriaxone 1 g IVPB.   Review of Systems: As mentioned in the history of present illness. All other systems reviewed and are negative.  Past Medical History:  Diagnosis Date   Alcohol abuse 07/19/2020   Benign essential hypertension 01/08/2015   Broken wrist    Generalized anxiety disorder 07/19/2020   GERD (gastroesophageal reflux disease) 07/19/2020   Mixed hyperlipidemia 09/21/2017   Past Surgical History:  Procedure Laterality Date   BIOPSY  07/19/2020   Procedure: BIOPSY;  Surgeon: Jackquline Denmark, MD;  Location: [facility] ENDOSCOPY;  Service: Endoscopy;;   BIOPSY  12/09/2020   Procedure: BIOPSY;  Surgeon: Jackquline Denmark, MD;  Location: [facility] ENDOSCOPY;  Service: Endoscopy;;   COLONOSCOPY WITH PROPOFOL N/A 12/09/2020   Procedure: COLONOSCOPY WITH PROPOFOL;  Surgeon: Jackquline Denmark, MD;  Location: [facility] ENDOSCOPY;  Service: Endoscopy;  Laterality: N/A;   CYSTOSCOPY WITH INSERTION OF UROLIFT  06/19/2021   [facility]   ESOPHAGOGASTRODUODENOSCOPY (EGD) WITH PROPOFOL N/A 07/19/2020   Procedure: ESOPHAGOGASTRODUODENOSCOPY (EGD) WITH PROPOFOL;  Surgeon: Jackquline Denmark, MD;  Location: [facility] ENDOSCOPY;  Service: Endoscopy;  Laterality: N/A;   ESOPHAGOGASTRODUODENOSCOPY (EGD) WITH PROPOFOL N/A 12/09/2020   Procedure: ESOPHAGOGASTRODUODENOSCOPY (EGD) WITH PROPOFOL;  Surgeon: Jackquline Denmark, MD;  Location: [facility] ENDOSCOPY;  Service: Endoscopy;  Laterality: N/A;   GI RADIOFREQUENCY ABLATION  07/19/2020   Procedure: GI RADIOFREQUENCY ABLATION;  Surgeon: Jackquline Denmark, MD;  Location: [facility] ENDOSCOPY;  Service: Endoscopy;;   IR PARACENTESIS  02/05/2022   IR PARACENTESIS  03/11/2022  POLYPECTOMY  07/19/2020   Procedure: POLYPECTOMY;  Surgeon: Jackquline Denmark, MD;  Location: [facility] ENDOSCOPY;  Service: Endoscopy;;   POLYPECTOMY  12/09/2020   Procedure: POLYPECTOMY;   Surgeon: Jackquline Denmark, MD;  Location: [facility] ENDOSCOPY;  Service: Endoscopy;;   WRIST ARTHROCENTESIS  10/2021   Social History:  reports that he has never smoked. His smokeless tobacco use includes snuff. He reports that he does not currently use alcohol. No history on file for drug use.  Allergies  Allergen Reactions   Yellow Jacket Venom Anaphylaxis and Shortness Of Breath    Family History  Problem Relation Age of Onset   High blood pressure Mother    Dementia Mother    Ulcerative colitis Father    Stomach cancer Neg Hx    Colon cancer Neg Hx    Pancreatic cancer Neg Hx     Prior to Admission medications   Medication Sig Start Date End Date Taking? Authorizing Provider  citalopram (CELEXA) 40 MG tablet Take 40 mg by mouth daily.    [provider]  EPINEPHrine 0.3 mg/0.3 mL IJ SOAJ injection Inject 0.3 mg into the muscle as needed for anaphylaxis. 11/15/17   [provider]  ferrous sulfate 325 (65 FE) MG tablet Take 1 tablet (325 mg total) by mouth 2 (two) times daily. 11/24/21   Jackquline Denmark, MD  folic acid (FOLVITE) 1 MG tablet Take 1 tablet (1 mg total) by mouth daily. 09/15/21   Jackquline Denmark, MD  furosemide (LASIX) 40 MG tablet Take 1 tablet (40 mg total) by mouth daily. 02/03/22   Jackquline Denmark, MD  hydrOXYzine (ATARAX/VISTARIL) 10 MG tablet Take 1 tablet (10 mg total) by mouth 3 (three) times daily. Patient taking differently: Take 10 mg by mouth daily. 07/30/20   Nita Sells, MD  lactulose (CHRONULAC) 10 GM/15ML solution Take 30 mLs (20 g total) by mouth 2 (two) times daily. 02/25/22   Jackquline Denmark, MD  pantoprazole (PROTONIX) 40 MG tablet Take 1 tablet (40 mg total) by mouth 2 (two) times daily. 07/30/20   Nita Sells, MD  potassium chloride SA (KLOR-CON M) 20 MEQ tablet Take 1 tablet (20 mEq total) by mouth daily for 7 days. 02/12/22 02/19/22  Jackquline Denmark, MD  potassium chloride SA (KLOR-CON M) 20 MEQ tablet Take 1 tablet (20 mEq total) by  mouth daily. 03/16/22   Jackquline Denmark, MD  rifaximin (XIFAXAN) 550 MG TABS tablet Take 1 tablet (550 mg total) by mouth 2 (two) times daily. 12/31/21   Jackquline Denmark, MD  spironolactone (ALDACTONE) 50 MG tablet Take 1 tablet (50 mg total) by mouth daily. 02/03/22   Jackquline Denmark, MD  thiamine 100 MG tablet Take 1 tablet (100 mg total) by mouth daily. 07/31/20   Nita Sells, MD    Physical Exam: Vitals:   03/16/22 1145 03/16/22 1200 03/16/22 1215 03/16/22 1236  BP: 135/66 122/61 134/68   Pulse: 72 74 74   Resp: '15 17 14   '$ Temp:   98.1 F (36.7 C) 98.1 F (36.7 C)  TempSrc:   Oral Oral  SpO2: 100% 100% 100%   Weight:      Height:       Physical Exam Vitals and nursing note reviewed.  Constitutional:      General: He is awake. He is not in acute distress.    Appearance: He is well-developed. He is ill-appearing.     Comments: Chronically ill-appearing.  HENT:     Head: Normocephalic.     Nose: No  rhinorrhea.     Mouth/Throat:     Mouth: Mucous membranes are moist.  Eyes:     General: Scleral icterus present.     Pupils: Pupils are equal, round, and reactive to light.  Neck:     Vascular: No JVD.  Cardiovascular:     Rate and Rhythm: Normal rate and regular rhythm.     Heart sounds: S1 normal and S2 normal.  Pulmonary:     Effort: Pulmonary effort is normal.     Breath sounds: Examination of the right-lower field reveals decreased breath sounds. Examination of the left-lower field reveals decreased breath sounds. Decreased breath sounds present. No wheezing, rhonchi or rales.  Abdominal:     Palpations: Abdomen is soft.     Tenderness: There is no abdominal tenderness.  Musculoskeletal:     Cervical back: Neck supple.     Right lower leg: 1+ Edema present.     Left lower leg: 1+ Edema present.  Skin:    General: Skin is warm and dry.  Neurological:     General: No focal deficit present.     Mental Status: He is alert and oriented to person, place, and time.   Psychiatric:        Mood and Affect: Mood normal.        Behavior: Behavior normal. Behavior is cooperative.   Data Reviewed:  Results are pending, will review when available.  Assessment and Plan: Principal Problem:   Hyponatremia In the setting of:   Decompensation of cirrhosis of liver (HCC)   Portal hypertensive gastropathy (HCC) Observation/MedSurg Sodium and fluid restriction. Furosemide 20 mg IVP x 1. Albumin 50 g IVPB x 1. Continue spironolactone 50 mg p.o. daily. Monitor daily weights, intake and output. Monitor renal function and electrolytes. Soup manage electrolytes as needed. Continue ceftriaxone 1 g IVPB daily. Continue lactulose 10 g p.o. twice daily. Continue Xifaxan 550 mg p.o. twice daily. IR will perform paracentesis tomorrow.  Active Problems:   Generalized anxiety disorder Continue citalopram 40 mg p.o. daily.    GERD (gastroesophageal reflux disease) Continue pantoprazole 40 mg p.o. twice daily.    Benign essential hypertension Continue diuretics as above. Monitor BP, renal function electrolytes.    Mixed hyperlipidemia Not on therapy due to liver disease.    Prediabetes Carbohydrate modified diet. CBG monitoring before meals and bedtime.      Advance Care Planning:   Code Status: Full Code   Consults: IR will perform paracentesis in AM. [facility] will consult as needed.  Family Communication: His spouse was at bedside.  Severity of Illness: The appropriate patient status for this patient is INPATIENT. Inpatient status is judged to be reasonable and necessary in order to provide the required intensity of service to ensure the patient's safety. The patient's presenting symptoms, physical exam findings, and initial radiographic and laboratory data in the context of their chronic comorbidities is felt to place them at high risk for further clinical deterioration. Furthermore, it is not anticipated that the patient will be medically stable  for discharge from the hospital within 2 midnights of admission.   * I certify that at the point of admission it is my clinical judgment that the patient will require inpatient hospital care spanning beyond 2 midnights from the point of admission due to high intensity of service, high risk for further deterioration and high frequency of surveillance required.*  Author: Reubin Milan, MD 03/16/2022 3:39 PM  For on call review [email or web address].   This document  was prepared using Systems analyst and may contain some unintended transcription errors.

## 2022-03-16 NOTE — ED Notes (Signed)
Phone Handoff Report provided to rec RN at [facility] / 5 Museum/gallery exhibitions officer at bedside

## 2022-03-16 NOTE — Telephone Encounter (Signed)
If still with cramps, use pickle juice 1 ounce 4 times daily as needed RG

## 2022-03-16 NOTE — ED Notes (Signed)
Hospitalist Consult made to [employer]

## 2022-03-16 NOTE — Telephone Encounter (Signed)
Pl see phone note -pickle juice pl RG

## 2022-03-16 NOTE — ED Notes (Signed)
Called the IP unit attempting to give report on pt. Staff instructed me to call back in 15 minutes.

## 2022-03-16 NOTE — Telephone Encounter (Signed)
-----   Message from Jackquline Denmark, MD sent at 03/14/2022  6:49 PM EST ----- Please inform the patient. Add K-Dur 20 mg po QD #30, 4 RF Repeat BMP, magnesium in 2 weeks Send report to family physician

## 2022-03-16 NOTE — Telephone Encounter (Signed)
Chart reviewed. Pt currently admitted to hospital. Will notify pt once discharged.

## 2022-03-16 NOTE — ED Triage Notes (Signed)
C/o difficulty urinating, having BM, generalized cramping, shortness of breath. Hx of cirrhosis. Has gained 8 pounds since 2/21 when he had paracentesis with 974m output, feels fluid has built back up in abdomen, ascites noted. Pt of Dr. GLyndel Safe

## 2022-03-16 NOTE — ED Notes (Signed)
Post Void residual complete. 28m noted.

## 2022-03-16 NOTE — ED Notes (Signed)
Spoke with Marcello Moores at [place] for hospitalist consult.

## 2022-03-16 NOTE — ED Notes (Signed)
Troponin #2 obtained

## 2022-03-16 NOTE — Telephone Encounter (Signed)
Labs and medication sent. Patient currently in ED will call once out

## 2022-03-16 NOTE — Progress Notes (Signed)
Plan of Care Note for accepted transfer   Patient: Kristopher Ramos MRN: [id number]   [place]: 03/16/2022  Facility requesting transfer: [facility].  Requesting Provider: Sherrell Puller, PA-C and Regan Lemming MD. Reason for transfer: Decompensated liver cirrhosis. Facility course:  67 year old male with a past medical history of liver cirrhosis who presented to the emergency department with abdominal pain, generalized abdominal cramping, dyspnea and weight gain of 8 pounds in the last 5 days after he had paracentesis on 900 mL on 03/11/2021.  ED spoke to Dr. Lyndel Safe from [facility] who recommended admission.  IR has been contacted for US guided paracentesis.  He was given ceftriaxone to cover for spontaneous bacterial peritonitis.  Please let IR know once the patient arrives to the hospital.  Plan of care: The patient is accepted for admission to [place]  unit, at [facility].  Author: Reubin Milan, MD 03/16/2022  Check [email or web address] for on-call coverage.  Nursing staff, Please call Trail number on Amion as soon as patient's arrival, so appropriate admitting provider can evaluate the pt.

## 2022-03-16 NOTE — Telephone Encounter (Deleted)
Nurse reviewed chart and sent message to patient regarding findings from the weekend

## 2022-03-16 NOTE — ED Provider Notes (Signed)
[facility] [facility] DEPART[facility] AT [facility] [place] Provider Note   CSN: [id number] Arrival date & time: 03/16/22  0844     History Chief Complaint  Patient presents with   Abdominal Pain    Kristopher Ramos is a 67 y.o. male with history of decompensated EtOH cirrhosis, cramping, microcytic anemia, anxiety, hyperlipidemia, hypertension presents the emergency room today for evaluation of abdominal swelling, weight gain, body cramps.  He reports that he has gained 8 pounds since 03-11-2022 when he had a paracentesis with 900 mL output.  He reports the fluid he feels gets fell back into his abdomen.  He feels more short of breath but no chest pain.  Again, he feels that this is because his fluid has increased.  He reports that he feels like he is not urinating enough like he was before.  No pain when he urinates or any hematuria.  He reports that he also feels like his brain fog is been worsening for the past 3 to 4 days.  This is not a new thing however.  He reports that he also feels thirsty.  Reports some nausea but no vomiting.  No chest pain, fevers, rhinorrhea, nasal ingestion, hematochezia.  He reports that he has not had any alcohol in 2 years.  He reports that he has been talking to his GI person who recommended he increase his potassium but he told him he was going to the ER instead.  He sees Dr. Lyndel Safe with [facility].   Abdominal Pain Associated symptoms: shortness of breath   Associated symptoms: no chest pain and no fever        Home Medications Prior to Admission medications   Medication Sig Start Date End Date Taking? Authorizing Provider  citalopram (CELEXA) 40 MG tablet Take 40 mg by mouth daily.    [provider]  EPINEPHrine 0.3 mg/0.3 mL IJ SOAJ injection Inject 0.3 mg into the muscle as needed for anaphylaxis. 11/15/17   [provider]  ferrous sulfate 325 (65 FE) MG tablet Take 1 tablet (325 mg total) by mouth 2 (two) times daily. 11/24/21   Jackquline Denmark, MD  folic acid (FOLVITE) 1 MG tablet Take 1 tablet (1 mg total) by mouth daily. 09/15/21   Jackquline Denmark, MD  furosemide (LASIX) 40 MG tablet Take 1 tablet (40 mg total) by mouth daily. 02/03/22   Jackquline Denmark, MD  hydrOXYzine (ATARAX/VISTARIL) 10 MG tablet Take 1 tablet (10 mg total) by mouth 3 (three) times daily. Patient taking differently: Take 10 mg by mouth daily. 07/30/20   Nita Sells, MD  lactulose (CHRONULAC) 10 GM/15ML solution Take 30 mLs (20 g total) by mouth 2 (two) times daily. 02/25/22   Jackquline Denmark, MD  pantoprazole (PROTONIX) 40 MG tablet Take 1 tablet (40 mg total) by mouth 2 (two) times daily. 07/30/20   Nita Sells, MD  potassium chloride SA (KLOR-CON M) 20 MEQ tablet Take 1 tablet (20 mEq total) by mouth daily for 7 days. 02/12/22 02/19/22  Jackquline Denmark, MD  potassium chloride SA (KLOR-CON M) 20 MEQ tablet Take 1 tablet (20 mEq total) by mouth daily. 03/16/22   Jackquline Denmark, MD  rifaximin (XIFAXAN) 550 MG TABS tablet Take 1 tablet (550 mg total) by mouth 2 (two) times daily. 12/31/21   Jackquline Denmark, MD  spironolactone (ALDACTONE) 50 MG tablet Take 1 tablet (50 mg total) by mouth daily. 02/03/22   Jackquline Denmark, MD  thiamine 100 MG tablet Take 1 tablet (100 mg total) by  mouth daily. 07/31/20   Nita Sells, MD      Allergies    Yellow jacket venom    Review of Systems   Review of Systems  Constitutional:  Negative for fever.  Respiratory:  Positive for shortness of breath.   Cardiovascular:  Negative for chest pain.  Gastrointestinal:  Positive for abdominal distention and abdominal pain.  See HPI  Physical Exam Updated Vital Signs BP (!) 134/90 (BP Location: Right Arm)   Pulse 72   Temp 98.3 F (36.8 C) (Oral)   Resp 18   Ht '5\' 6"'$  (1.676 m)   Wt 92.1 kg   SpO2 100%   BMI 32.77 kg/m  Physical Exam Vitals and nursing note reviewed.  Constitutional:      General: He is not in acute distress.    Appearance: He is not ill-appearing  or toxic-appearing.  Eyes:     General: No scleral icterus. Cardiovascular:     Rate and Rhythm: Normal rate.  Pulmonary:     Effort: Pulmonary effort is normal. No respiratory distress.     Breath sounds: Normal breath sounds.  Abdominal:     General: Bowel sounds are normal. There is distension.     Comments: Diffuse tenderness with some voluntary guarding.  Abdomen is distended however still pliable.  He does have some telangiectasias noticed to the left abdomen.  No caput medusae. Fluid on percussion.   Skin:    General: Skin is warm and dry.  Neurological:     General: No focal deficit present.     Mental Status: He is alert and oriented to person, place, and time.     Cranial Nerves: No cranial nerve deficit.     Motor: No weakness.     ED Results / Procedures / Treatments   Labs (all labs ordered are listed, but only abnormal results are displayed) Labs Reviewed  CBC WITH DIFFERENTIAL/PLATELET - Abnormal; Notable for the following components:      Result Value   RBC 2.83 (*)    Hemoglobin 10.1 (*)    HCT 29.3 (*)    MCV 103.5 (*)    MCH 35.7 (*)    Platelets 117 (*)    All other components within normal limits  COMPREHENSIVE METABOLIC PANEL - Abnormal; Notable for the following components:   Sodium 124 (*)    Chloride 95 (*)    Glucose, Bld 116 (*)    Calcium 7.8 (*)    Total Protein 5.8 (*)    Albumin 2.5 (*)    AST 135 (*)    ALT 80 (*)    Alkaline Phosphatase 223 (*)    Total Bilirubin 2.4 (*)    All other components within normal limits  AMMONIA - Abnormal; Notable for the following components:   Ammonia 52 (*)    All other components within normal limits  LIPASE, BLOOD - Abnormal; Notable for the following components:   Lipase 71 (*)    All other components within normal limits  PROTIME-INR - Abnormal; Notable for the following components:   Prothrombin Time 16.3 (*)    INR 1.3 (*)    All other components within normal limits  LACTIC ACID, PLASMA   MAGNESIUM  URINALYSIS, ROUTINE W REFLEX MICROSCOPIC  LACTIC ACID, PLASMA  POC OCCULT BLOOD, ED  TROPONIN I (HIGH SENSITIVITY)  TROPONIN I (HIGH SENSITIVITY)    EKG EKG Interpretation  Date/Time:  Monday March 16 2022 09:00:34 EST Ventricular Rate:  77 PR Interval:  122 QRS Duration: 94 QT Interval:  468 QTC Calculation: 530 R Axis:   32 Text Interpretation: Sinus rhythm Inferior infarct, old Prolonged QT interval Confirmed by Regan Lemming ([id number]) on 03/16/2022 10:42:51 AM  Radiology CT ABDOMEN PELVIS W CONTRAST  Result Date: 03/16/2022 CLINICAL DATA:  67 year old male with cirrhosis and recurrent ascites. Weight gain following recent thoracentesis on 03/11/2022. EXAM: CT ABDOMEN AND PELVIS WITH CONTRAST TECHNIQUE: Multidetector CT imaging of the abdomen and pelvis was performed using the standard protocol following bolus administration of intravenous contrast. RADIATION DOSE REDUCTION: This exam was performed according to the departmental dose-optimization program which includes automated exposure control, adjustment of the mA and/or kV according to patient size and/or use of iterative reconstruction technique. CONTRAST:  175m OMNIPAQUE IOHEXOL 300 MG/ML  SOLN COMPARISON:  CT Abdomen and Pelvis 02/01/2022. Liver MRI 02/09/2022. FINDINGS: Lower chest: Calcified coronary artery atherosclerosis. No pericardial or pleural effusion. Small fat and ascites containing gastric hiatal hernia is stable. Visible lungs are clear. Hepatobiliary: Nodular and cirrhotic liver. Perihepatic ascites volume is very similar to that on the 02/01/2022 CT, small to moderate. Stable liver enhancement (see liver MRI last month). No acute gallbladder or biliary finding. Pancreas: Negative. Spleen: Stable splenomegaly, small volume perisplenic ascites. Ascites has simple fluid density. Adrenals/Urinary Tract: Normal adrenal glands. Nonobstructed kidneys with symmetric renal enhancement and contrast excretion.  Nondilated ureters. Unremarkable bladder. Stomach/Bowel: No dilated large or small bowel. Similar large bowel redundancy and retained stool. Evidence that the appendix remains gas-filled and normal on series 2, image 62. Mild small bowel wall thickening, and moderate gastric thickening such as from portal gastropathy. No free air. Incidental superimposed juxta phrenic gastric diverticula as well as distal duodenum diverticulum with no convincing active inflammation. Vascular/Lymphatic: Aortoiliac calcified atherosclerosis. Normal caliber abdominal aorta. Major arterial structures in the abdomen and pelvis appear patent. Portal venous system is patent with extensive varices. Bulky left side retroperitoneal varices again noted. Reproductive: Sequelae of prostate brachytherapy. Other: Small but increased volume of pelvic ascites when compared to the January CT. Simple fluid density. Musculoskeletal: No acute or suspicious osseous lesion identified. Mild for age spine degeneration. IMPRESSION: 1. Re-demonstrated Cirrhosis and multiple stigmata of portal venous hypertension. 2. Ascites volume is stable to slightly greater than that on the CT Abdomen and Pelvis 02/01/2022, after which 2 liters of fluid was obtained by Paracentesis on 02/05/2022. 3.  Aortic Atherosclerosis (ICD10-I70.0). Electronically Signed   By: HGenevie AnnM.D.   On: 03/16/2022 10:40   DG Chest 2 View  Result Date: 03/16/2022 CLINICAL DATA:  Shortness of breath.  Difficulty with urination. EXAM: CHEST - 2 VIEW COMPARISON:  01/24/2009 FINDINGS: Heart size is normal but with some left ventricular prominence. Relative elevation of the diaphragm, possibly secondary to ascites. Mild atelectasis at the lung bases associated with that. No visible effusion. No focal consolidation. IMPRESSION: Elevation of the diaphragm, possibly secondary to ascites. Mild atelectasis at the lung bases associated with that. Electronically Signed   By: MNelson ChimesM.D.   On:  03/16/2022 09:39    Procedures Procedures   Medications Ordered in ED Medications  cefTRIAXone (ROCEPHIN) 1 g in sodium chloride 0.9 % 100 mL IVPB (1 g Intravenous New Bag/Given 03/16/22 1137)  iohexol (OMNIPAQUE) 300 MG/ML solution 100 mL (100 mLs Intravenous Contrast Given 03/16/22 1013)    ED Course/ Medical Decision Making/ A&P  Medical Decision Making Amount and/or Complexity of Data Reviewed Labs: ordered. Radiology: ordered.  Risk Prescription drug management. Decision regarding hospitalization.   67 year old male presents emerged from today for evaluation of abdominal cramping, abdominal distention as well as pain with nausea.  Differential diagnosis includes is limited to ascites, decompensating cirrhosis, SBP.  Vital signs show mildly elevated blood pressure otherwise afebrile, heart rate, satting well room air without increased work of breathing.  Physical exam as noted above.  Labs and CT imaging ordered.  I independently reviewed and interpreted the patient's labs.  Mildly elevated INR 1.3.  Mildly elevated PT as well as 16.3.  Normal magnesium.  Ammonia 52.  Lactic acid within normal limits.  Lipase is elevated at 71.  Urinalysis is unremarkable.  CBC shows slight anemia at 10.1 appears to be around his baseline.  No leukocytosis.  Platelets consistent with his previous with thrombocytopenia.  CMP does show worsening hyponatremia however with 124.  Chloride decreased at 95.  Glucose at 116.  He has decreased calcium, protein, and albumin.  AST, ALT, alk phos, total bilirubin are elevated however appears to be around the baseline.  Troponins are flat at 6.  CT imaging shows . Re-demonstrated Cirrhosis and multiple stigmata of portal venous hypertension. 2. Ascites volume is stable to slightly greater than that on the CT Abdomen and Pelvis 02/01/2022, after which 2 liters of fluid was obtained by Paracentesis on 02/05/2022. 3.  Aortic Atherosclerosis    Chest x-ray shows elevation of the diaphragm, possibly secondary to ascites. Mild atelectasis at the lung bases associated with that.  Patient was given Rocephin for coverage for SBP.  I consulted gastroenterology and spoke with Dr. Lorenso Courier.  She recommends holding the diuretics as well as a low volume paracentesis between 1 to 2 L.  Does recommend admission given the patient's worsening electrolyte abnormalities.  I discussed admission with the patient he is amenable.  Will admit to [facility] hospitalist.  I discussed this case with my attending physician who cosigned this note including patient's presenting symptoms, physical exam, and planned diagnostics and interventions. Attending physician stated agreement with plan or made changes to plan which were implemented.    Final Clinical Impression(s) / ED Diagnoses Final diagnoses:  Ascites due to alcoholic cirrhosis [facility])    Rx / DC Orders ED Discharge Orders     None         Sherrell Puller, Hershal Coria 03/16/22 2052    Regan Lemming, MD 03/17/22 [phone]

## 2022-03-17 ENCOUNTER — Inpatient Hospital Stay (HOSPITAL_COMMUNITY): Payer: 59

## 2022-03-17 ENCOUNTER — Other Ambulatory Visit: Payer: Self-pay

## 2022-03-17 DIAGNOSIS — K7682 Hepatic encephalopathy: Secondary | ICD-10-CM | POA: Diagnosis present

## 2022-03-17 DIAGNOSIS — D500 Iron deficiency anemia secondary to blood loss (chronic): Secondary | ICD-10-CM

## 2022-03-17 DIAGNOSIS — R7303 Prediabetes: Secondary | ICD-10-CM | POA: Diagnosis present

## 2022-03-17 DIAGNOSIS — Z8249 Family history of ischemic heart disease and other diseases of the circulatory system: Secondary | ICD-10-CM | POA: Diagnosis not present

## 2022-03-17 DIAGNOSIS — Z820 Family history of epilepsy and other diseases of the nervous system: Secondary | ICD-10-CM | POA: Diagnosis not present

## 2022-03-17 DIAGNOSIS — E782 Mixed hyperlipidemia: Secondary | ICD-10-CM | POA: Diagnosis present

## 2022-03-17 DIAGNOSIS — I10 Essential (primary) hypertension: Secondary | ICD-10-CM | POA: Diagnosis present

## 2022-03-17 DIAGNOSIS — Z6832 Body mass index (BMI) 32.0-32.9, adult: Secondary | ICD-10-CM | POA: Diagnosis not present

## 2022-03-17 DIAGNOSIS — D62 Acute posthemorrhagic anemia: Secondary | ICD-10-CM | POA: Diagnosis present

## 2022-03-17 DIAGNOSIS — K766 Portal hypertension: Secondary | ICD-10-CM | POA: Diagnosis present

## 2022-03-17 DIAGNOSIS — Z888 Allergy status to other drugs, medicaments and biological substances status: Secondary | ICD-10-CM | POA: Diagnosis not present

## 2022-03-17 DIAGNOSIS — K7290 Hepatic failure, unspecified without coma: Secondary | ICD-10-CM

## 2022-03-17 DIAGNOSIS — F411 Generalized anxiety disorder: Secondary | ICD-10-CM | POA: Diagnosis present

## 2022-03-17 DIAGNOSIS — R1012 Left upper quadrant pain: Secondary | ICD-10-CM | POA: Diagnosis not present

## 2022-03-17 DIAGNOSIS — K7460 Unspecified cirrhosis of liver: Secondary | ICD-10-CM | POA: Diagnosis not present

## 2022-03-17 DIAGNOSIS — D6959 Other secondary thrombocytopenia: Secondary | ICD-10-CM | POA: Diagnosis present

## 2022-03-17 DIAGNOSIS — K219 Gastro-esophageal reflux disease without esophagitis: Secondary | ICD-10-CM | POA: Diagnosis present

## 2022-03-17 DIAGNOSIS — I8510 Secondary esophageal varices without bleeding: Secondary | ICD-10-CM | POA: Diagnosis present

## 2022-03-17 DIAGNOSIS — K31819 Angiodysplasia of stomach and duodenum without bleeding: Secondary | ICD-10-CM | POA: Diagnosis present

## 2022-03-17 DIAGNOSIS — E669 Obesity, unspecified: Secondary | ICD-10-CM | POA: Diagnosis present

## 2022-03-17 DIAGNOSIS — Z720 Tobacco use: Secondary | ICD-10-CM | POA: Diagnosis not present

## 2022-03-17 DIAGNOSIS — K5909 Other constipation: Secondary | ICD-10-CM | POA: Diagnosis present

## 2022-03-17 DIAGNOSIS — D696 Thrombocytopenia, unspecified: Secondary | ICD-10-CM

## 2022-03-17 DIAGNOSIS — G4700 Insomnia, unspecified: Secondary | ICD-10-CM | POA: Diagnosis present

## 2022-03-17 DIAGNOSIS — R188 Other ascites: Secondary | ICD-10-CM

## 2022-03-17 DIAGNOSIS — Z91030 Bee allergy status: Secondary | ICD-10-CM | POA: Diagnosis not present

## 2022-03-17 DIAGNOSIS — F1011 Alcohol abuse, in remission: Secondary | ICD-10-CM | POA: Diagnosis present

## 2022-03-17 DIAGNOSIS — K7031 Alcoholic cirrhosis of liver with ascites: Principal | ICD-10-CM

## 2022-03-17 DIAGNOSIS — K3189 Other diseases of stomach and duodenum: Secondary | ICD-10-CM | POA: Diagnosis not present

## 2022-03-17 DIAGNOSIS — E871 Hypo-osmolality and hyponatremia: Secondary | ICD-10-CM

## 2022-03-17 DIAGNOSIS — Z79899 Other long term (current) drug therapy: Secondary | ICD-10-CM | POA: Diagnosis not present

## 2022-03-17 HISTORY — PX: IR PARACENTESIS: IMG2679

## 2022-03-17 LAB — COMPREHENSIVE METABOLIC PANEL
ALT: 62 U/L — ABNORMAL HIGH (ref 0–44)
AST: 103 U/L — ABNORMAL HIGH (ref 15–41)
Albumin: 2.8 g/dL — ABNORMAL LOW (ref 3.5–5.0)
Alkaline Phosphatase: 122 U/L (ref 38–126)
Anion gap: 6 (ref 5–15)
BUN: 11 mg/dL (ref 8–23)
CO2: 22 mmol/L (ref 22–32)
Calcium: 8.2 mg/dL — ABNORMAL LOW (ref 8.9–10.3)
Chloride: 100 mmol/L (ref 98–111)
Creatinine, Ser: 0.82 mg/dL (ref 0.61–1.24)
GFR, Estimated: >60 mL/min (ref >60)
Glucose, Bld: 105 mg/dL — ABNORMAL HIGH (ref 70–99)
Potassium: 3.9 mmol/L (ref 3.5–5.1)
Sodium: 128 mmol/L — ABNORMAL LOW (ref 135–145)
Total Bilirubin: 2.8 mg/dL — ABNORMAL HIGH (ref 0.3–1.2)
Total Protein: 5.3 g/dL — ABNORMAL LOW (ref 6.5–8.1)

## 2022-03-17 LAB — GLUCOSE, CAPILLARY
Glucose-Capillary: 115 mg/dL — ABNORMAL HIGH (ref 70–99)
Glucose-Capillary: 109 mg/dL — ABNORMAL HIGH (ref 70–99)
Glucose-Capillary: 65 mg/dL — ABNORMAL LOW (ref 70–99)
Glucose-Capillary: 95 mg/dL (ref 70–99)
Glucose-Capillary: 97 mg/dL (ref 70–99)
Glucose-Capillary: 171 mg/dL — ABNORMAL HIGH (ref 70–99)

## 2022-03-17 LAB — ALBUMIN, PLEURAL OR PERITONEAL FLUID: Albumin, Fluid: <1.5 g/dL

## 2022-03-17 LAB — GRAM STAIN

## 2022-03-17 LAB — CBC
HCT: 27.3 % — ABNORMAL LOW (ref 39.0–52.0)
Hemoglobin: 9.2 g/dL — ABNORMAL LOW (ref 13.0–17.0)
MCH: 36.5 pg — ABNORMAL HIGH (ref 26.0–34.0)
MCHC: 33.7 g/dL (ref 30.0–36.0)
MCV: 108.3 fL — ABNORMAL HIGH (ref 80.0–100.0)
Platelets: 103 10*3/uL — ABNORMAL LOW (ref 150–400)
RBC: 2.52 MIL/uL — ABNORMAL LOW (ref 4.22–5.81)
RDW: 14.3 % (ref 11.5–15.5)
WBC: 4.7 10*3/uL (ref 4.0–10.5)
nRBC: 0 % (ref 0.0–0.2)

## 2022-03-17 LAB — LACTATE DEHYDROGENASE, PLEURAL OR PERITONEAL FLUID: LD, Fluid: 31 U/L — ABNORMAL HIGH (ref 3–23)

## 2022-03-17 LAB — BODY FLUID CELL COUNT WITH DIFFERENTIAL
Eos, Fluid: 0 %
Lymphs, Fluid: 13 %
Monocyte-Macrophage-Serous Fluid: 68 % (ref 50–90)
Neutrophil Count, Fluid: 19 % (ref 0–25)
Total Nucleated Cell Count, Fluid: 364 cu mm (ref 0–1000)

## 2022-03-17 LAB — GLUCOSE, PLEURAL OR PERITONEAL FLUID: Glucose, Fluid: 174 mg/dL

## 2022-03-17 LAB — PROTEIN, PLEURAL OR PERITONEAL FLUID: Total protein, fluid: <3 g/dL

## 2022-03-17 LAB — FOLATE: Folate: 25.2 ng/mL (ref >5.9)

## 2022-03-17 LAB — VITAMIN B12: Vitamin B-12: 1087 pg/mL — ABNORMAL HIGH (ref 180–914)

## 2022-03-17 LAB — HIV ANTIBODY (ROUTINE TESTING W REFLEX): HIV Screen 4th Generation wRfx: NONREACTIVE

## 2022-03-17 MED ORDER — LIDOCAINE HCL 1 % IJ SOLN
INTRAMUSCULAR | Status: AC
  Filled 2022-03-17: qty 20

## 2022-03-17 MED ORDER — MELATONIN 5 MG PO TABS
5.0000 mg | ORAL_TABLET | Freq: Every evening | ORAL | Status: DC | PRN
  Administered 2022-03-17 – 2022-03-18 (×2): 5 mg via ORAL
  Filled 2022-03-17 (×3): qty 1

## 2022-03-17 MED ORDER — SPIRONOLACTONE 100 MG PO TABS
100.0000 mg | ORAL_TABLET | Freq: Every day | ORAL | Status: DC
  Administered 2022-03-17 – 2022-03-19 (×3): 100 mg via ORAL
  Filled 2022-03-17 (×3): qty 1

## 2022-03-17 NOTE — Telephone Encounter (Signed)
Chart reviewed.  Phone note received. Pt currently in hospital admitted.  Will follow up with pt once discharged from the hospital.

## 2022-03-17 NOTE — Progress Notes (Signed)
  Transition of Care [facility]) Screening Note   Patient Details  Name: Kristopher Ramos Date of Birth: 08/18/1955   Transition of Care [facility]) CM/SW Contact:    Vassie Moselle, LCSW Phone Number: 03/17/2022, 1:43 PM    Transition of Care Department [facility]) has reviewed patient and no TOC needs have been identified at this time. We will continue to monitor patient advancement through interdisciplinary progression rounds. If new patient transition needs arise, please place a TOC consult.

## 2022-03-17 NOTE — Procedures (Signed)
PROCEDURE SUMMARY:  Successful ultrasound guided paracentesis from the left lower quadrant.  Yielded 1.5 L of straw colored fluid.  No immediate complications.  The patient tolerated the procedure well.   Specimen was sent for labs.  EBL < 58m  The patient has required >/=2 paracenteses in a 30 day period and a screening evaluation by the [facility] has been arranged.
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
## 2022-03-17 NOTE — Progress Notes (Addendum)
[facility]                                                                              Kristopher Ramos, is a 67 y.o. male, DOB - Jan 12, 1956, UK:[id number] Admit date - 03/16/2022    Outpatient Primary MD for the patient is Kristopher Glee., MD  LOS - 0  days  Chief Complaint  Patient presents with   Abdominal Pain       Brief summary   Patient is a 67 year old male with  anxiety, prediabetes, hypertension, wrist fracture, generalized anxiety disorder, GERD, mixed anemia, history of alcohol abuse in remission, alcoholic hepatitis, alcoholic liver cirrhosis, portal hypertension presented to ED with abdominal pain and worsening ascites over the last few days.  He a paracentesis last week and 900 cc of ascitic fluid was removed.  No emesis, melena, hematochezia, states compliance with his medications. In ED, ammonia 52, lipase 71, lactic acid normal.  Troponins x 2 unremarkable.  Sodium 124, potassium 3.6, CO2 24.  Renal function normal.  AST 135, ALT 80, alk phos 223 Chest x-ray showed mild atelectasis, elevation of the diaphragm likely due to ascites CT abdomen showed cirrhosis and multiple stigmata of portal hypertension, ascites volume stable to slightly larger than the previous CT on 02/01/2022   Assessment & Plan    Principal Problem:   Decompensation of cirrhosis of liver (HCC) with worsening ascites, portal hypertensive gastropathy -Ultrasound-guided paracentesis ordered, follow studies. Received Lasix 20 mg IV x 1, albumin 50 g IV x 1 -Placed on IV Rocephin, continue rifaximin, Aldactone, lactulose -GI consulted, follow recommendations -Currently alert and oriented x3, continue lactulose  Active Problems: Acute on chronic hyponatremia -Baseline sodium around 129, presented with sodium 124 -Improving, 128 today  Macrocytic anemia, chronic thrombocytopenia due to alcoholic liver disease -Due to #1, follow counts -Obtain B12, folate    Generalized  anxiety disorder -Continue escitalopram    GERD (gastroesophageal reflux disease) -Continue PPI     Benign essential hypertension -BP stable, continue Aldactone, received IV Lasix    Prediabetes  - CBG stable, diet controlled  Obesity Estimated body mass index is 32.77 kg/m as calculated from the following:   Height as of this encounter: '5\' 6"'$  (1.676 m).   Weight as of this encounter: 92.1 kg.  Code Status: full code  DVT Prophylaxis:  SCDs Start: 03/16/22 1533   Level of Care: Level of care: Med-Surg Family Communication: Updated patient Disposition Plan:      Remains inpatient appropriate: Pending workup  Procedures:    Consultants:   GI  Antimicrobials:   Anti-infectives (From admission, onward)    Start     Dose/Rate Route Frequency Ordered Stop   03/16/22 2200  cefTRIAXone (ROCEPHIN) 2 g in sodium chloride 0.9 % 100 mL IVPB        2 g 200 mL/hr over 30 Minutes Intravenous Every 24 hours 03/16/22 1630     03/16/22 2200  rifaximin (XIFAXAN) tablet 550 mg        550 mg Oral 2 times daily 03/16/22 1635     03/16/22 1130  cefTRIAXone (ROCEPHIN) 1 g in sodium chloride  0.9 % 100 mL IVPB        1 g 200 mL/hr over 30 Minutes Intravenous  Once 03/16/22 1126 03/16/22 1212          Medications  citalopram  40 mg Oral Daily   ferrous sulfate  325 mg Oral Q breakfast   folic acid  1 mg Oral Daily   hydrOXYzine  10 mg Oral Daily   lactulose  10 g Oral BID   pantoprazole  40 mg Oral BID   potassium chloride SA  20 mEq Oral Daily   rifaximin  550 mg Oral BID   spironolactone  50 mg Oral QHS   thiamine  100 mg Oral Daily      Subjective:   Kristopher Ramos was seen and examined today.  Still having abdominal pain, abdominal distention and swelling in the legs, jaundice.  No fevers, nausea vomiting, chest pain or shortness of breath.  Objective:   Vitals:   03/16/22 1215 03/16/22 1236 03/16/22 1520 03/16/22 1944  BP: 134/68  (!) 140/62 (!) 141/69  Pulse:  74  72 82  Resp: '14  16 18  '$ Temp: 98.1 F (36.7 C) 98.1 F (36.7 C) 98.1 F (36.7 C) 98.2 F (36.8 C)  TempSrc: Oral Oral Oral Oral  SpO2: 100%  100% 97%  Weight:      Height:        Intake/Output Summary (Last 24 hours) at 03/17/2022 1042 Last data filed at 03/17/2022 1019 Gross per 24 hour  Intake 100 ml  Output 1000 ml  Net -900 ml     Wt Readings from Last 3 Encounters:  03/16/22 92.1 kg  02/20/22 89.1 kg  01/30/22 97.5 kg     Exam General: Alert and oriented x 3, NAD, ill-appearing HEENT: scleral icterus + Cardiovascular: S1 S2 auscultated,  RRR Respiratory: Diminished breath sound at bases  Gastrointestinal: Soft, nontender, nondistended, + bowel sounds Ext: 1+ pedal edema bilaterally Neuro: no new deficits Psych: Normal affect    Data Reviewed:  I have personally reviewed following labs    CBC Lab Results  Component Value Date   WBC 4.7 03/17/2022   RBC 2.52 (L) 03/17/2022   HGB 9.2 (L) 03/17/2022   HCT 27.3 (L) 03/17/2022   MCV 108.3 (H) 03/17/2022   MCH 36.5 (H) 03/17/2022   PLT 103 (L) 03/17/2022   MCHC 33.7 03/17/2022   RDW 14.3 03/17/2022   LYMPHSABS 1.0 03/16/2022   MONOABS 1.0 03/16/2022   EOSABS 0.2 03/16/2022   BASOSABS 0.1 0000000     Last metabolic panel Lab Results  Component Value Date   NA 128 (L) 03/17/2022   K 3.9 03/17/2022   CL 100 03/17/2022   CO2 22 03/17/2022   BUN 11 03/17/2022   CREATININE 0.82 03/17/2022   GLUCOSE 105 (H) 03/17/2022   GFRNONAA >60 03/17/2022   GFRAA  01/22/2009    >60        The eGFR has been calculated using the MDRD equation. This calculation has not been validated in all clinical situations. eGFR's persistently <60 mL/min signify possible Chronic Kidney Disease.   CALCIUM 8.2 (L) 03/17/2022   PHOS 3.9 07/21/2020   PHOS 3.9 07/21/2020   PROT 5.3 (L) 03/17/2022   ALBUMIN 2.8 (L) 03/17/2022   BILITOT 2.8 (H) 03/17/2022   ALKPHOS 122 03/17/2022   AST 103 (H) 03/17/2022   ALT 62  (H) 03/17/2022   ANIONGAP 6 03/17/2022    CBG (last 3)  Recent Labs  03/16/22 2146 03/17/22 0751  GLUCAP 127* 115*      Coagulation Profile: Recent Labs  Lab 03/16/22 0914  INR 1.3*     Radiology Studies: I have personally reviewed the imaging studies  CT ABDOMEN PELVIS W CONTRAST  Result Date: 03/16/2022 CLINICAL DATA:  67 year old male with cirrhosis and recurrent ascites. Weight gain following recent thoracentesis on 03/11/2022. EXAM: CT ABDOMEN AND PELVIS WITH CONTRAST TECHNIQUE: Multidetector CT imaging of the abdomen and pelvis was performed using the standard protocol following bolus administration of intravenous contrast. RADIATION DOSE REDUCTION: This exam was performed according to the departmental dose-optimization program which includes automated exposure control, adjustment of the mA and/or kV according to patient size and/or use of iterative reconstruction technique. CONTRAST:  154m OMNIPAQUE IOHEXOL 300 MG/ML  SOLN COMPARISON:  CT Abdomen and Pelvis 02/01/2022. Liver MRI 02/09/2022. FINDINGS: Lower chest: Calcified coronary artery atherosclerosis. No pericardial or pleural effusion. Small fat and ascites containing gastric hiatal hernia is stable. Visible lungs are clear. Hepatobiliary: Nodular and cirrhotic liver. Perihepatic ascites volume is very similar to that on the 02/01/2022 CT, small to moderate. Stable liver enhancement (see liver MRI last month). No acute gallbladder or biliary finding. Pancreas: Negative. Spleen: Stable splenomegaly, small volume perisplenic ascites. Ascites has simple fluid density. Adrenals/Urinary Tract: Normal adrenal glands. Nonobstructed kidneys with symmetric renal enhancement and contrast excretion. Nondilated ureters. Unremarkable bladder. Stomach/Bowel: No dilated large or small bowel. Similar large bowel redundancy and retained stool. Evidence that the appendix remains gas-filled and normal on series 2, image 62. Mild small bowel wall  thickening, and moderate gastric thickening such as from portal gastropathy. No free air. Incidental superimposed juxta phrenic gastric diverticula as well as distal duodenum diverticulum with no convincing active inflammation. Vascular/Lymphatic: Aortoiliac calcified atherosclerosis. Normal caliber abdominal aorta. Major arterial structures in the abdomen and pelvis appear patent. Portal venous system is patent with extensive varices. Bulky left side retroperitoneal varices again noted. Reproductive: Sequelae of prostate brachytherapy. Other: Small but increased volume of pelvic ascites when compared to the January CT. Simple fluid density. Musculoskeletal: No acute or suspicious osseous lesion identified. Mild for age spine degeneration. IMPRESSION: 1. Re-demonstrated Cirrhosis and multiple stigmata of portal venous hypertension. 2. Ascites volume is stable to slightly greater than that on the CT Abdomen and Pelvis 02/01/2022, after which 2 liters of fluid was obtained by Paracentesis on 02/05/2022. 3.  Aortic Atherosclerosis (ICD10-I70.0). Electronically Signed   By: HGenevie AnnM.D.   On: 03/16/2022 10:40   DG Chest 2 View  Result Date: 03/16/2022 CLINICAL DATA:  Shortness of breath.  Difficulty with urination. EXAM: CHEST - 2 VIEW COMPARISON:  01/24/2009 FINDINGS: Heart size is normal but with some left ventricular prominence. Relative elevation of the diaphragm, possibly secondary to ascites. Mild atelectasis at the lung bases associated with that. No visible effusion. No focal consolidation. IMPRESSION: Elevation of the diaphragm, possibly secondary to ascites. Mild atelectasis at the lung bases associated with that. Electronically Signed   By: MNelson ChimesM.D.   On: 03/16/2022 09:39       Zacharia Sowles M.D. [facility] 03/17/2022, 10:42 AM  Available via Epic secure chat 7am-7pm After 7 pm, please refer to night coverage provider listed on amion.

## 2022-03-18 ENCOUNTER — Encounter (HOSPITAL_COMMUNITY): Payer: Self-pay

## 2022-03-18 ENCOUNTER — Inpatient Hospital Stay (HOSPITAL_COMMUNITY): Payer: 59 | Admitting: Certified Registered"

## 2022-03-18 ENCOUNTER — Telehealth: Payer: Self-pay | Admitting: Nurse Practitioner

## 2022-03-18 ENCOUNTER — Encounter (HOSPITAL_COMMUNITY)
Admission: EM | Disposition: A | Discharge status: Home / Self Care | Payer: Self-pay | Source: Home / Self Care | Attending: Internal Medicine

## 2022-03-18 DIAGNOSIS — F419 Anxiety disorder, unspecified: Secondary | ICD-10-CM

## 2022-03-18 DIAGNOSIS — K31819 Angiodysplasia of stomach and duodenum without bleeding: Secondary | ICD-10-CM

## 2022-03-18 DIAGNOSIS — K3189 Other diseases of stomach and duodenum: Secondary | ICD-10-CM

## 2022-03-18 DIAGNOSIS — K7031 Alcoholic cirrhosis of liver with ascites: Secondary | ICD-10-CM | POA: Diagnosis not present

## 2022-03-18 DIAGNOSIS — D62 Acute posthemorrhagic anemia: Secondary | ICD-10-CM

## 2022-03-18 DIAGNOSIS — D696 Thrombocytopenia, unspecified: Secondary | ICD-10-CM | POA: Diagnosis not present

## 2022-03-18 DIAGNOSIS — K766 Portal hypertension: Secondary | ICD-10-CM

## 2022-03-18 DIAGNOSIS — K922 Gastrointestinal hemorrhage, unspecified: Secondary | ICD-10-CM

## 2022-03-18 DIAGNOSIS — R195 Other fecal abnormalities: Secondary | ICD-10-CM

## 2022-03-18 DIAGNOSIS — I8510 Secondary esophageal varices without bleeding: Secondary | ICD-10-CM

## 2022-03-18 DIAGNOSIS — E871 Hypo-osmolality and hyponatremia: Secondary | ICD-10-CM | POA: Diagnosis not present

## 2022-03-18 DIAGNOSIS — K7290 Hepatic failure, unspecified without coma: Secondary | ICD-10-CM | POA: Diagnosis not present

## 2022-03-18 HISTORY — PX: ESOPHAGOGASTRODUODENOSCOPY (EGD) WITH PROPOFOL: SHX5813

## 2022-03-18 HISTORY — PX: HOT HEMOSTASIS: SHX5433

## 2022-03-18 LAB — CBC
HCT: 28.3 % — ABNORMAL LOW (ref 39.0–52.0)
Hemoglobin: 9.4 g/dL — ABNORMAL LOW (ref 13.0–17.0)
MCH: 36.4 pg — ABNORMAL HIGH (ref 26.0–34.0)
MCHC: 33.2 g/dL (ref 30.0–36.0)
MCV: 109.7 fL — ABNORMAL HIGH (ref 80.0–100.0)
Platelets: 96 10*3/uL — ABNORMAL LOW (ref 150–400)
RBC: 2.58 MIL/uL — ABNORMAL LOW (ref 4.22–5.81)
RDW: 14.3 % (ref 11.5–15.5)
WBC: 3.6 10*3/uL — ABNORMAL LOW (ref 4.0–10.5)
nRBC: 0 % (ref 0.0–0.2)

## 2022-03-18 LAB — IRON AND TIBC
Iron: 68 ug/dL (ref 45–182)
Saturation Ratios: 32 % (ref 17.9–39.5)
TIBC: 211 ug/dL — ABNORMAL LOW (ref 250–450)
UIBC: 143 ug/dL

## 2022-03-18 LAB — GLUCOSE, CAPILLARY
Glucose-Capillary: 116 mg/dL — ABNORMAL HIGH (ref 70–99)
Glucose-Capillary: 130 mg/dL — ABNORMAL HIGH (ref 70–99)
Glucose-Capillary: 155 mg/dL — ABNORMAL HIGH (ref 70–99)

## 2022-03-18 LAB — COMPREHENSIVE METABOLIC PANEL
ALT: 59 U/L — ABNORMAL HIGH (ref 0–44)
AST: 98 U/L — ABNORMAL HIGH (ref 15–41)
Albumin: 2.6 g/dL — ABNORMAL LOW (ref 3.5–5.0)
Alkaline Phosphatase: 138 U/L — ABNORMAL HIGH (ref 38–126)
Anion gap: 5 (ref 5–15)
BUN: 11 mg/dL (ref 8–23)
CO2: 24 mmol/L (ref 22–32)
Calcium: 8.1 mg/dL — ABNORMAL LOW (ref 8.9–10.3)
Chloride: 102 mmol/L (ref 98–111)
Creatinine, Ser: 0.86 mg/dL (ref 0.61–1.24)
GFR, Estimated: >60 mL/min (ref >60)
Glucose, Bld: 99 mg/dL (ref 70–99)
Potassium: 3.8 mmol/L (ref 3.5–5.1)
Sodium: 131 mmol/L — ABNORMAL LOW (ref 135–145)
Total Bilirubin: 1.7 mg/dL — ABNORMAL HIGH (ref 0.3–1.2)
Total Protein: 5 g/dL — ABNORMAL LOW (ref 6.5–8.1)

## 2022-03-18 LAB — FERRITIN: Ferritin: 45 ng/mL (ref 24–336)

## 2022-03-18 SURGERY — ESOPHAGOGASTRODUODENOSCOPY (EGD) WITH PROPOFOL
Anesthesia: Monitor Anesthesia Care

## 2022-03-18 MED ORDER — SUCRALFATE 1 G PO TABS
1.0000 g | ORAL_TABLET | Freq: Three times a day (TID) | ORAL | Status: DC
  Administered 2022-03-18 – 2022-03-20 (×5): 1 g via ORAL
  Filled 2022-03-18 (×6): qty 1

## 2022-03-18 MED ORDER — PROPOFOL 1000 MG/100ML IV EMUL
INTRAVENOUS | Status: AC
  Filled 2022-03-18: qty 100

## 2022-03-18 MED ORDER — SODIUM CHLORIDE 0.9 % IV SOLN: INTRAVENOUS | Status: DC

## 2022-03-18 MED ORDER — PROPOFOL 10 MG/ML IV BOLUS
INTRAVENOUS | Status: DC | PRN
  Administered 2022-03-18 (×3): 25 mg via INTRAVENOUS

## 2022-03-18 MED ORDER — LACTATED RINGERS IV SOLN: INTRAVENOUS | Status: DC | PRN

## 2022-03-18 MED ORDER — PHENYLEPHRINE 80 MCG/ML (10ML) SYRINGE FOR IV PUSH (FOR BLOOD PRESSURE SUPPORT)
PREFILLED_SYRINGE | INTRAVENOUS | Status: DC | PRN
  Administered 2022-03-18 (×2): 160 ug via INTRAVENOUS

## 2022-03-18 MED ORDER — FUROSEMIDE 40 MG PO TABS
40.0000 mg | ORAL_TABLET | Freq: Every day | ORAL | Status: DC
  Administered 2022-03-18 – 2022-03-20 (×3): 40 mg via ORAL
  Filled 2022-03-18 (×3): qty 1

## 2022-03-18 MED ORDER — PROPOFOL 500 MG/50ML IV EMUL
INTRAVENOUS | Status: AC
  Filled 2022-03-18: qty 50

## 2022-03-18 MED ORDER — PROPOFOL 500 MG/50ML IV EMUL
INTRAVENOUS | Status: DC | PRN
  Administered 2022-03-18: 125 ug/kg/min via INTRAVENOUS

## 2022-03-18 MED ORDER — LIDOCAINE 2% (20 MG/ML) 5 ML SYRINGE
INTRAMUSCULAR | Status: DC | PRN
  Administered 2022-03-18: 80 mg via INTRAVENOUS

## 2022-03-18 SURGICAL SUPPLY — 15 items

## 2022-03-18 NOTE — Progress Notes (Signed)
[facility]                                                                              Edie Botti, is a 67 y.o. male, DOB - May 18, 1955, UK:[id number] Admit date - 03/16/2022    Outpatient Primary MD for the patient is Kristopher Glee., MD  LOS - 1  days  Chief Complaint  Patient presents with   Abdominal Pain       Brief summary   Patient is a 67 year old male with  anxiety, prediabetes, hypertension, wrist fracture, generalized anxiety disorder, GERD, mixed anemia, history of alcohol abuse in remission, alcoholic hepatitis, alcoholic liver cirrhosis, portal hypertension presented to ED with abdominal pain and worsening ascites over the last few days.  He a paracentesis last week and 900 cc of ascitic fluid was removed.  No emesis, melena, hematochezia, states compliance with his medications. In ED, ammonia 52, lipase 71, lactic acid normal.  Troponins x 2 unremarkable.  Sodium 124, potassium 3.6, CO2 24.  Renal function normal.  AST 135, ALT 80, alk phos 223 Chest x-ray showed mild atelectasis, elevation of the diaphragm likely due to ascites CT abdomen showed cirrhosis and multiple stigmata of portal hypertension, ascites volume stable to slightly larger than the previous CT on 02/01/2022   Assessment & Plan    Principal Problem:   Decompensation of cirrhosis of liver (HCC) with worsening ascites, portal hypertensive gastropathy -Ultrasound-guided paracentesis ordered, follow studies. Received Lasix 20 mg IV x 1, albumin 50 g IV x 1 -Placed on IV Rocephin, continue rifaximin, Aldactone, lactulose -Plan for endoscopy today -Status post ultrasound-guided paracentesis, 1.5 L removed, follow cultures -Resume Lasix  Active Problems: Macrocytic anemia, chronic thrombocytopenia due to alcoholic liver disease -Due to #1, follow counts -B12, folate normal -Plan for endoscopy today  Acute on chronic hyponatremia -Baseline sodium around 129, presented with  sodium 124 - improving    Generalized anxiety disorder -Continue escitalopram    GERD (gastroesophageal reflux disease) -Continue PPI     Benign essential hypertension -BP stable, continue Aldactone, received IV Lasix    Prediabetes  - CBG stable, diet controlled  Obesity Estimated body mass index is 32.77 kg/m as calculated from the following:   Height as of this encounter: '5\' 6"'$  (1.676 m).   Weight as of this encounter: 92.1 kg.  Code Status: full code  DVT Prophylaxis:  SCDs Start: 03/16/22 1533   Level of Care: Level of care: Med-Surg Family Communication: Updated patient Disposition Plan:      Remains inpatient appropriate: Pending workup  Procedures:    Consultants:   GI  Antimicrobials:   Anti-infectives (From admission, onward)    Start     Dose/Rate Route Frequency Ordered Stop   03/16/22 2200  [MAR Hold]  cefTRIAXone (ROCEPHIN) 2 g in sodium chloride 0.9 % 100 mL IVPB        (MAR Hold since Wed 03/18/2022 at 1157.Hold Reason: Transfer to a Procedural area)   2 g 200 mL/hr over 30 Minutes Intravenous Every 24 hours 03/16/22 1630     03/16/22 2200  [MAR Hold]  rifaximin (XIFAXAN) tablet 550 mg        (  MAR Hold since Wed 03/18/2022 at 1157.Hold Reason: Transfer to a Procedural area)   550 mg Oral 2 times daily 03/16/22 1635     03/16/22 1130  cefTRIAXone (ROCEPHIN) 1 g in sodium chloride 0.9 % 100 mL IVPB        1 g 200 mL/hr over 30 Minutes Intravenous  Once 03/16/22 1126 03/16/22 1212          Medications  [MAR Hold] ferrous sulfate  325 mg Oral Q breakfast   [MAR Hold] folic acid  1 mg Oral Daily   [MAR Hold] hydrOXYzine  10 mg Oral Daily   [MAR Hold] lactulose  10 g Oral BID   [MAR Hold] pantoprazole  40 mg Oral BID   [MAR Hold] potassium chloride SA  20 mEq Oral Daily   [MAR Hold] rifaximin  550 mg Oral BID   [MAR Hold] spironolactone  100 mg Oral QHS   [MAR Hold] thiamine  100 mg Oral Daily      Subjective:   Heru Gunzenhauser was seen  and examined today.  Still complaining of abdominal pain, abdominal distention is improving after paracentesis yesterday.  States he has crampy and constant abdominal pain in lower quadrants.  No nausea vomiting, fevers.  Objective:   Vitals:   03/17/22 1411 03/17/22 2026 03/18/22 0528 03/18/22 1204  BP: (!) 129/59 (!) 117/58 130/66 121/65  Pulse: 73 78 60 68  Resp: '19 18 16 11  '$ Temp: 97.9 F (36.6 C) 98.1 F (36.7 C) 97.7 F (36.5 C) (!) 97.2 F (36.2 C)  TempSrc: Oral Oral Oral Temporal  SpO2: 100% 99% 99% 100%  Weight:      Height:        Intake/Output Summary (Last 24 hours) at 03/18/2022 1233 Last data filed at 03/18/2022 0600 Gross per 24 hour  Intake 840 ml  Output 1800 ml  Net -960 ml     Wt Readings from Last 3 Encounters:  03/16/22 92.1 kg  02/20/22 89.1 kg  01/30/22 97.5 kg   Physical Exam General: Alert and oriented x 3, NAD HEENT: scleral icterus+ Cardiovascular: S1 S2 clear, RRR.  Respiratory: CTAB, no wheezing, rales or rhonchi Gastrointestinal: Soft, diffuse TTP,  nondistended, NBS Ext: 1+ pedal edema bilaterally Neuro: no new deficits Psych: Normal affect    Data Reviewed:  I have personally reviewed following labs    CBC Lab Results  Component Value Date   WBC 3.6 (L) 03/18/2022   RBC 2.58 (L) 03/18/2022   HGB 9.4 (L) 03/18/2022   HCT 28.3 (L) 03/18/2022   MCV 109.7 (H) 03/18/2022   MCH 36.4 (H) 03/18/2022   PLT 96 (L) 03/18/2022   MCHC 33.2 03/18/2022   RDW 14.3 03/18/2022   LYMPHSABS 1.0 03/16/2022   MONOABS 1.0 03/16/2022   EOSABS 0.2 03/16/2022   BASOSABS 0.1 0000000     Last metabolic panel Lab Results  Component Value Date   NA 131 (L) 03/18/2022   K 3.8 03/18/2022   CL 102 03/18/2022   CO2 24 03/18/2022   BUN 11 03/18/2022   CREATININE 0.86 03/18/2022   GLUCOSE 99 03/18/2022   GFRNONAA >60 03/18/2022   GFRAA  01/22/2009    >60        The eGFR has been calculated using the MDRD equation. This calculation has  not been validated in all clinical situations. eGFR's persistently <60 mL/min signify possible Chronic Kidney Disease.   CALCIUM 8.1 (L) 03/18/2022   PHOS 3.9 07/21/2020   PHOS 3.9  07/21/2020   PROT 5.0 (L) 03/18/2022   ALBUMIN 2.6 (L) 03/18/2022   BILITOT 1.7 (H) 03/18/2022   ALKPHOS 138 (H) 03/18/2022   AST 98 (H) 03/18/2022   ALT 59 (H) 03/18/2022   ANIONGAP 5 03/18/2022    CBG (last 3)  Recent Labs    03/17/22 2151 03/18/22 0747 03/18/22 1139  GLUCAP 171* 116* 130*      Coagulation Profile: Recent Labs  Lab 03/16/22 0914  INR 1.3*     Radiology Studies: I have personally reviewed the imaging studies  IR Paracentesis  Result Date: 03/17/2022 INDICATION: Patient history of alcoholic cirrhosis with recurrent ascites. Request is for therapeutic and diagnostic paracentesis EXAM: ULTRASOUND GUIDED THERAPEUTIC AND DIAGNOSTIC PARACENTESIS MEDICATIONS: Lidocaine 1% 10 mL COMPLICATIONS: None immediate. PROCEDURE: Informed written consent was obtained from the patient after a discussion of the risks, benefits and alternatives to treatment. A timeout was performed prior to the initiation of the procedure. Initial ultrasound scanning demonstrates a small amount of ascites within the left lower abdominal quadrant. The left lower abdomen was prepped and draped in the usual sterile fashion. 1% lidocaine was used for local anesthesia. Following this, a 19 gauge, 5-cm, Yueh catheter was introduced. An ultrasound image was saved for documentation purposes. The paracentesis was performed. The catheter was removed and a dressing was applied. The patient tolerated the procedure well without immediate post procedural complication. FINDINGS: A total of approximately 1.5 L of straw-colored fluid was removed. Samples were sent to the laboratory as requested by the clinical team. IMPRESSION: Successful ultrasound-guided therapeutic and diagnostic paracentesis yielding 1.5 liters of peritoneal fluid.  Read by: Rushie Nyhan, NP PLAN: The patient has required >/=2 paracenteses in a 30 day period and a formal evaluation by the [facility] has been arranged. Michaelle Birks, MD [facility] Radiology Electronically Signed   By: Michaelle Birks M.D.   On: 03/17/2022 14:37       Sandy Haye M.D. [facility] 03/18/2022, 12:33 PM  Available via Epic secure chat 7am-7pm After 7 pm, please refer to night coverage provider listed on amion.

## 2022-03-18 NOTE — Anesthesia Preprocedure Evaluation (Addendum)
Anesthesia Evaluation  Patient identified by MRN, date of birth, ID band Patient awake    Reviewed: Allergy & Precautions, NPO status , Patient's Chart, lab work & pertinent test results  History of Anesthesia Complications Negative for: history of anesthetic complications  Airway Mallampati: II  TM Distance: >3 FB Neck ROM: Full    Dental no notable dental hx.    Pulmonary neg pulmonary ROS   Pulmonary exam normal        Cardiovascular hypertension, Normal cardiovascular exam     Neuro/Psych   Anxiety     negative neurological ROS     GI/Hepatic ,GERD  Medicated,,(+) Cirrhosis     substance abuse  alcohol use, Hepatitis -melena   Endo/Other  negative endocrine ROS    Renal/GU negative Renal ROS  negative genitourinary   Musculoskeletal negative musculoskeletal ROS (+)    Abdominal   Peds  Hematology  (+) Blood dyscrasia (Hgb 9.4), anemia   Anesthesia Other Findings Day of surgery medications reviewed with patient.  Reproductive/Obstetrics negative OB ROS                              Anesthesia Physical Anesthesia Plan  ASA: 3  Anesthesia Plan: MAC   Post-op Pain Management: Minimal or no pain anticipated   Induction:   PONV Risk Score and Plan: 1 and Propofol infusion and Treatment may vary due to age or medical condition  Airway Management Planned: Natural Airway and Nasal Cannula  Additional Equipment: None  Intra-op Plan:   Post-operative Plan:   Informed Consent: I have reviewed the patients History and Physical, chart, labs and discussed the procedure including the risks, benefits and alternatives for the proposed anesthesia with the patient or authorized representative who has indicated his/her understanding and acceptance.       Plan Discussed with: CRNA  Anesthesia Plan Comments:          Anesthesia Quick Evaluation

## 2022-03-18 NOTE — Interval H&P Note (Signed)
History and Physical Interval Note:  03/18/2022 12:59 PM  Kristopher Ramos  has presented today for surgery, with the diagnosis of Anemia and black stools, cirrhosis.  The various methods of treatment have been discussed with the patient and family. After consideration of risks, benefits and other options for treatment, the patient has consented to  Procedure(s): ESOPHAGOGASTRODUODENOSCOPY (EGD) WITH PROPOFOL (N/A) as a surgical intervention.  The patient's history has been reviewed, patient examined, no change in status, stable for surgery.  I have reviewed the patient's chart and labs.  Questions were answered to the patient's satisfaction.     Pricilla Riffle. Fuller Plan

## 2022-03-18 NOTE — Anesthesia Procedure Notes (Signed)
Procedure Name: MAC Date/Time: 03/18/2022 1:07 PM  Performed by: Renato Shin, CRNAPre-anesthesia Checklist: Patient identified, Emergency Drugs available, Suction available and Patient being monitored Patient Re-evaluated:Patient Re-evaluated prior to induction Oxygen Delivery Method: Nasal cannula Preoxygenation: Pre-oxygenation with 100% oxygen Induction Type: IV induction Airway Equipment and Method: Bite block Placement Confirmation: positive ETCO2 and breath sounds checked- equal and bilateral Dental Injury: Teeth and Oropharynx as per pre-operative assessment

## 2022-03-18 NOTE — Telephone Encounter (Signed)
Remo Lipps, pls contact patient and schedule him for a hospital follow up with Dr. Lyndel Safe as he will likely be discharged home today.  Pls send patient to our lab in one week to have a CBC, CMP and INR level. THX

## 2022-03-18 NOTE — Transfer of Care (Signed)
Immediate Anesthesia Transfer of Care Note  Patient: Kristopher Ramos  Procedure(s) Performed: ESOPHAGOGASTRODUODENOSCOPY (EGD) WITH PROPOFOL HOT HEMOSTASIS (ARGON PLASMA COAGULATION/BICAP)  Patient Location: PACU and Endoscopy Unit  Anesthesia Type:MAC  Level of Consciousness: awake and patient cooperative  Airway & Oxygen Therapy: Patient Spontanous Breathing and Patient connected to nasal cannula oxygen  Post-op Assessment: Report given to RN and Post -op Vital signs reviewed and stable  Post vital signs: Reviewed and stable  Last Vitals:  Vitals Value Taken Time  BP 158/78 03/18/22 1355  Temp 36.2 C 03/18/22 1323  Pulse 94 03/18/22 1359  Resp 25 03/18/22 1359  SpO2 100 % 03/18/22 1359  Vitals shown include unvalidated device data.  Last Pain:  Vitals:   03/18/22 1343  TempSrc:   PainSc: 0-No pain      Patients Stated Pain Goal: 0 ([id number])  Complications: No notable events documented.

## 2022-03-18 NOTE — Anesthesia Postprocedure Evaluation (Signed)
Anesthesia Post Note  Patient: Kristopher Ramos  Procedure(s) Performed: ESOPHAGOGASTRODUODENOSCOPY (EGD) WITH PROPOFOL HOT HEMOSTASIS (ARGON PLASMA COAGULATION/BICAP)     Patient location during evaluation: PACU Anesthesia Type: MAC Level of consciousness: awake and alert Pain management: pain level controlled Vital Signs Assessment: post-procedure vital signs reviewed and stable Respiratory status: spontaneous breathing, nonlabored ventilation and respiratory function stable Cardiovascular status: blood pressure returned to baseline Postop Assessment: no apparent nausea or vomiting Anesthetic complications: no   No notable events documented.  Last Vitals:  Vitals:   03/18/22 1338 03/18/22 1343  BP: (!) 112/45 (!) 109/55  Pulse: (!) 56 (!) 58  Resp: 13 15  Temp:    SpO2: 100% 97%    Last Pain:  Vitals:   03/18/22 1343  TempSrc:   PainSc: 0-No pain                 Marthenia Rolling

## 2022-03-18 NOTE — Op Note (Addendum)
[facility] Patient Name: Kristopher Ramos Procedure Date: 03/18/2022 MRN: [id number] Attending MD: Ladene Artist , MD, [id number] Date of Birth: Dec 06, 1955 CSN: [id number] Age: 67 Admit Type: Inpatient Procedure:                Upper GI endoscopy Indications:              Acute post hemorrhagic anemia, Dark stools,                            Suspected upper gastrointestinal bleeding Providers:                Pricilla Riffle. Fuller Plan, MD, Elmer Ramp. Tilden Dome, RN, Frazier Richards, Technician Referring MD:             [facility] Medicines:                Monitored Anesthesia Care Complications:            No immediate complications. Estimated Blood Loss:     Estimated blood loss was minimal. Procedure:                Pre-Anesthesia Assessment:                           - Prior to the procedure, a History and Physical                            was performed, and patient medications and                            allergies were reviewed. The patient's tolerance of                            previous anesthesia was also reviewed. The risks                            and benefits of the procedure and the sedation                            options and risks were discussed with the patient.                            All questions were answered, and informed consent                            was obtained. Prior Anticoagulants: The patient has                            taken no anticoagulant or antiplatelet agents. ASA                            Grade Assessment: III - A patient with severe  systemic disease. After reviewing the risks and                            benefits, the patient was deemed in satisfactory                            condition to undergo the procedure.                           After obtaining informed consent, the endoscope was                            passed under direct vision. Throughout the                             procedure, the patient's blood pressure, pulse, and                            oxygen saturations were monitored continuously. The                            [id number] Olympus endoscope was introduced                            through the mouth, and advanced to the second part                            of duodenum. The upper GI endoscopy was                            accomplished without difficulty. The patient                            tolerated the procedure well. 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 They were 4 mm in       largest diameter.      The exam of the esophagus was otherwise normal.      Moderate portal hypertensive gastropathy was found in the entire       examined stomach.      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 Fulguration to ablate the lesion to prevent bleeding       by argon plasma was successful.      The exam of the stomach was otherwise normal.      Diffuse moderately erythematous mucosa without active bleeding and with       no stigmata of bleeding was found in the duodenal bulb and in the second       portion of the duodenum c/w portal duodenopathy. Impression:               - No specimens collected. Moderate Sedation:      Not Applicable - Patient had care per Anesthesia. Recommendation:           - Return patient to hospital ward for ongoing care.                           -  Resume heart healthy 2g Na diet.                           - Continue present medications at discharge                            including pantoprazole 40 mg bid, Lasix 40 mg qd                            and Aldactone 100 mg qd.                           - Carafate 1 g tid for 2 weeks.                           - OK for discharge from GI standpoint.                           - GI signing off.                           - Outpatient follow up with [facility]  liver transplant                            clinic and Dr. Lyndel Safe Procedure Code(s):        --- Professional ---                           [phone], Esophagogastroduodenoscopy, flexible,                            transoral; with control of bleeding, any method Diagnosis Code(s):        --- Professional ---                           D62, Acute posthemorrhagic anemia CPT copyright 2022 [employer]. All rights reserved. The codes documented in this report are preliminary and upon coder review may  be revised to meet current compliance requirements. Ladene Artist, MD 03/18/2022 1:31:52 PM This report has been signed electronically. Number of Addenda: 0

## 2022-03-19 ENCOUNTER — Other Ambulatory Visit: Payer: Self-pay

## 2022-03-19 ENCOUNTER — Inpatient Hospital Stay (HOSPITAL_COMMUNITY): Payer: 59

## 2022-03-19 DIAGNOSIS — R1012 Left upper quadrant pain: Secondary | ICD-10-CM

## 2022-03-19 DIAGNOSIS — K7290 Hepatic failure, unspecified without coma: Secondary | ICD-10-CM

## 2022-03-19 DIAGNOSIS — K7030 Alcoholic cirrhosis of liver without ascites: Secondary | ICD-10-CM

## 2022-03-19 DIAGNOSIS — E876 Hypokalemia: Secondary | ICD-10-CM

## 2022-03-19 DIAGNOSIS — D509 Iron deficiency anemia, unspecified: Secondary | ICD-10-CM

## 2022-03-19 DIAGNOSIS — D696 Thrombocytopenia, unspecified: Secondary | ICD-10-CM

## 2022-03-19 DIAGNOSIS — K7460 Unspecified cirrhosis of liver: Secondary | ICD-10-CM

## 2022-03-19 DIAGNOSIS — E871 Hypo-osmolality and hyponatremia: Secondary | ICD-10-CM

## 2022-03-19 LAB — CBC
HCT: 27.5 % — ABNORMAL LOW (ref 39.0–52.0)
Hemoglobin: 9.2 g/dL — ABNORMAL LOW (ref 13.0–17.0)
MCH: 36.8 pg — ABNORMAL HIGH (ref 26.0–34.0)
MCHC: 33.5 g/dL (ref 30.0–36.0)
MCV: 110 fL — ABNORMAL HIGH (ref 80.0–100.0)
Platelets: 97 10*3/uL — ABNORMAL LOW (ref 150–400)
RBC: 2.5 MIL/uL — ABNORMAL LOW (ref 4.22–5.81)
RDW: 14.3 % (ref 11.5–15.5)
WBC: 5.3 10*3/uL (ref 4.0–10.5)
nRBC: 0 % (ref 0.0–0.2)

## 2022-03-19 LAB — COMPREHENSIVE METABOLIC PANEL
ALT: 57 U/L — ABNORMAL HIGH (ref 0–44)
AST: 99 U/L — ABNORMAL HIGH (ref 15–41)
Albumin: 2.5 g/dL — ABNORMAL LOW (ref 3.5–5.0)
Alkaline Phosphatase: 147 U/L — ABNORMAL HIGH (ref 38–126)
Anion gap: 7 (ref 5–15)
BUN: 10 mg/dL (ref 8–23)
CO2: 20 mmol/L — ABNORMAL LOW (ref 22–32)
Calcium: 7.5 mg/dL — ABNORMAL LOW (ref 8.9–10.3)
Chloride: 100 mmol/L (ref 98–111)
Creatinine, Ser: 0.91 mg/dL (ref 0.61–1.24)
GFR, Estimated: >60 mL/min (ref >60)
Glucose, Bld: 104 mg/dL — ABNORMAL HIGH (ref 70–99)
Potassium: 3.8 mmol/L (ref 3.5–5.1)
Sodium: 127 mmol/L — ABNORMAL LOW (ref 135–145)
Total Bilirubin: 1.7 mg/dL — ABNORMAL HIGH (ref 0.3–1.2)
Total Protein: 4.8 g/dL — ABNORMAL LOW (ref 6.5–8.1)

## 2022-03-19 LAB — GLUCOSE, CAPILLARY
Glucose-Capillary: 110 mg/dL — ABNORMAL HIGH (ref 70–99)
Glucose-Capillary: 100 mg/dL — ABNORMAL HIGH (ref 70–99)
Glucose-Capillary: 138 mg/dL — ABNORMAL HIGH (ref 70–99)
Glucose-Capillary: 97 mg/dL (ref 70–99)
Glucose-Capillary: 106 mg/dL — ABNORMAL HIGH (ref 70–99)

## 2022-03-19 LAB — LIPASE, BLOOD: Lipase: 66 U/L — ABNORMAL HIGH (ref 11–51)

## 2022-03-19 MED ORDER — DICYCLOMINE HCL 10 MG PO CAPS
10.0000 mg | ORAL_CAPSULE | Freq: Three times a day (TID) | ORAL | Status: DC
  Administered 2022-03-19 – 2022-03-20 (×4): 10 mg via ORAL
  Filled 2022-03-19 (×4): qty 1

## 2022-03-19 MED ORDER — HYDROMORPHONE HCL 1 MG/ML IJ SOLN
1.0000 mg | INTRAMUSCULAR | Status: DC | PRN
  Administered 2022-03-19 (×2): 1 mg via INTRAVENOUS
  Filled 2022-03-19 (×2): qty 1

## 2022-03-19 NOTE — Progress Notes (Addendum)
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 Abstinent from alcohol since 07/2020.  Refer to initial GI consult note 03/16/2022.  He was admitted to the hospital 02/20/2025 with abdominal pain and decompensated cirrhosis with worsening ascites. Admission labs showed a WBC count of 5.6.  Hemoglobin 10.1.  Hematocrit 29.3.  Platelet 117.  Potassium 3.6.  BUN 13.  Creatinine 0.89.  Total bili 2.4.  Alk phos 223.  AST 135.  ALT 80. Sodium 124.  INR 1.3.  MELD 13. MELD Na 23. CTAP showed cirrhosis with ascites and portal venous hypertension without acute intra-abdominal/pelvic pathology to explain his abdominal pain. He was placed on Rocephin empirically. He underwent a paracentesis 03/17/2022, 1.5 L peritoneal fluid removed without evidence of SBP.  His hemoglobin level 3 weeks prior to admission was 11.9 and drifted down to 10.1 -> 9.2.  He noted passing very dark black stools. He underwent an EGD 03/18/2022 by Dr. Fuller Plan which identified grade 1 varices in the distal esophagus, moderate portal hypertensive gastropathy, moderate gastric antral vascular ectasia without bleeding in the gastric antrum, fulguration to ablate the lesion to prevent bleeding by APC was successful.  He was instructed to restart heart healthy diet, Pantoprazole 40 mg p.o. twice daily, Carafate 1 g 3 times daily for 2 weeks, Lasix 40 mg daily, Aldactone 100 mg daily and deemed okay for discharge from GI perspective and our service signed off.  Patient was to proceed with his liver transplant evaluation at [facility] next week.  A repeat GI consult was requested today by Dr. Tana Coast due to the patient exhibiting worsening left sided abdominal pain. Abdominal x-ray today showed persistent wall  thickening of small bowel loops in the left abdomen possibly due to portal hypertensive enteropathy and increased small bowel gas without evidence of obstruction, ileus not excluded.  He stated having worse left abdominal pain last night and this morning is different from his chronic right-sided abdominal pain.  No nausea or vomiting.  He passed a black loose stool yesterday, no bright red per rectum.  He stated his stools are always black since he started taking oral iron. No chest pain or shortness of breath.  He feels a little foggy headed but is not confused.  No family at the bedside at this time.  Labs today showed a WBC count of 5.3.  Hemoglobin 9.2.  Platelet 97.  Bili 1.7.  Alk phos 147.  AST 99.  ALT 57.  Albumin 2.5.  BUN 10.  Creatinine 0.91.  Sodium 127.  PRIOR IMAGE STUDIES:  Abdominal MRI 02/09/2022:  Study is degraded by motion limiting sensitivity and specificity. Additionally patient motion between postcontrast pulse sequences causes loss of portions of the hepatic dome from the acquired field-of-view. Within this context:   1. Corresponding with the abnormality seen on prior CT in the dome of the liver there is a ill-defined T1 hypointense area which is subtly T2 hyperintense common no discernible arterial hyperenhancement however given the location near the end field-of-view and respiratory motion evaluation for is degraded. Additionally, on delayed postcontrast pulse sequences this portion of the hepatic dome is excluded from the field of view limiting the evaluation for both potential washout and delayed enhancement. While this is favored to reflect an LI-RADS 3 lesion (  focal area of fibrosis), but given that is incompletely evaluated on postcontrast pulse sequences warrants short interval follow-up by multiphase hepatic protocol CT of the abdomen more definitive assessment as it will be less affected by motion. 2. Similar morphologic changes compatible with advanced  cirrhosis with portal hypertension including splenomegaly, portosystemic collateral vessels and portal enterocolopathy.   Abdominal MRI 08/11/2021: 1. No enhancing lesion within liver to suggest hepatoma. 2. Morphological changes of hepatic cirrhosis appears slightly improved from comparison exam. 3. No ascites. 4. 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 They were 4 mm in largest diameter. Findings: The exam of the esophagus was otherwise normal. Moderate portal hypertensive gastropathy was found in the entire examined stomach. 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 Fulguration to ablate the lesion to prevent bleeding by argon plasma was successful. The exam of the stomach was otherwise normal. Diffuse moderately erythematous mucosa without active bleeding and with no stigmata of bleeding was found in the duodenal bulb and in the second portion of the duodenum c/w portal duodenopathy. - No specimens collected.  Paracentesis 03/17/2022, 03/11/2022 and 02/05/2022 without evidence of SBP  Liver bx 07/30/2020 -Moderate to severely active steatohepatitis (grade 2-3 of 3) with  cirrhosis (stage 4 of 4).  EGD 07/19/2020 - GAVE. Treated with RFA followed by APC - Mild portal hypertensive gastropathy. - Gastric polyps s/p polypectomy (x 2) - No EV  EGD 12/09/2020: -Portal hypertensive gastropathy -Minimal gastric antral vascular ectasia without bleeding -Few gastric polyps   Colonoscopy 12/09/2020: -Two 4 to 6 mm polyps in the mid transverse colon and in the distal transverse colon, removed with a cold snare.  Resected and retrieved. Mild pancolonic diverticulosis predominantly in the right colon. Nonbleeding internal hemorrhoids. The examination was otherwise normal on direct and retroflexion views. -Recall colonoscopy 3 years   A. GASTRIC, BODY,  ANTRUM, BIOPSY:  - Antral mucosa with slight chronic inflammation.  - Warthin-Starry negative for Helicobacter pylori.   B. COLON, TRANSVERSE, POLYPECTOMY:  - Tubular adenoma (s) without high grade dysplasia.   Objective:  Vital signs in last 24 hours: Temp:  [97.2 F (36.2 C)-98.1 F (36.7 C)] 97.6 F (36.4 C) (02/29 [id number]) Pulse Rate:  [56-78] 66 (02/29 0608) Resp:  [11-18] 18 (02/29 [id number]) BP: (96-137)/(36-65) 117/57 (02/29 0608) SpO2:  [97 %-100 %] 99 % (02/29 [id number]) Last BM Date : 03/17/22 General: 67 year old chronically ill-appearing male in no acute distress. Eyes: No scleral icterus. Heart: Regular rate and rhythm, no murmurs. Pulm: Breath sounds clear throughout. Abdomen: Soft, nondistended. Moderate tenderness to the LUQ area which radiates to the LLQ and across to the right mid abdomen without rebound or guarding.  No significant ascites. Abdomen is not tense.  Positive bowel sounds to all 4 quadrants.  No palpable mass. LLQ paracentesis site drsg dry and intact.  Extremities: No lower extremity edema. Neurologic:  Alert and  oriented x 4.  Speech is clear.  He moves all extremities.  Hands are tremulous without asterixis. Psych:  Alert and cooperative. Normal mood and affect.  Intake/Output from previous day: 02/28 0701 - 02/29 0700 In: 200 [I.V.:200] Out: -  Intake/Output this shift: No intake/output data recorded.  Lab Results: Recent Labs    03/17/22 0557 03/18/22 0533 03/19/22 0524  WBC 4.7 3.6* 5.3  HGB 9.2* 9.4* 9.2*  HCT 27.3* 28.3* 27.5*  PLT 103* 96* 97*  BMET Recent Labs    03/17/22 0557 03/18/22 0533 03/19/22 0524  NA 128* 131* 127*  K 3.9 3.8 3.8  CL 100 102 100  CO2 22 24 20*  GLUCOSE 105* 99 104*  BUN '11 11 10  '$ CREATININE 0.82 0.86 0.91  CALCIUM 8.2* 8.1* 7.5*   LFT Recent Labs    03/19/22 0524  PROT 4.8*  ALBUMIN 2.5*  AST 99*  ALT 57*  ALKPHOS 147*  BILITOT 1.7*     DG Abd Portable 2V  Result Date:  03/19/2022 CLINICAL DATA:  Diffuse abdominal pain. EXAM: PORTABLE ABDOMEN - 2 VIEW COMPARISON:  CT abdomen and pelvis 03/16/2022 FINDINGS: Increased gas is present in multiple loops of mildly prominent but not frankly dilated small bowel in the right mid abdomen. There is again evidence of wall thickening of multiple small bowel loops in the left abdomen. Stool and gas are present in nondilated colon. No intraperitoneal free air is identified. The lung bases are grossly clear. Postsurgical changes are noted in the region of the prostate. IMPRESSION: 1. Increased small bowel gas without evidence of mechanical obstruction. Mild ileus is not excluded. 2. Persistent wall thickening of small bowel loops in the left abdomen which may reflect portal hypertensive enteropathy. Electronically Signed   By: Logan Bores M.D.   On: 03/19/2022 09:10   IR Paracentesis  Result Date: 03/17/2022 INDICATION: Patient history of alcoholic cirrhosis with recurrent ascites. Request is for therapeutic and diagnostic paracentesis EXAM: ULTRASOUND GUIDED THERAPEUTIC AND DIAGNOSTIC PARACENTESIS MEDICATIONS: Lidocaine 1% 10 mL COMPLICATIONS: None immediate. PROCEDURE: Informed written consent was obtained from the patient after a discussion of the risks, benefits and alternatives to treatment. A timeout was performed prior to the initiation of the procedure. Initial ultrasound scanning demonstrates a small amount of ascites within the left lower abdominal quadrant. The left lower abdomen was prepped and draped in the usual sterile fashion. 1% lidocaine was used for local anesthesia. Following this, a 19 gauge, 5-cm, Yueh catheter was introduced. An ultrasound image was saved for documentation purposes. The paracentesis was performed. The catheter was removed and a dressing was applied. The patient tolerated the procedure well without immediate post procedural complication. FINDINGS: A total of approximately 1.5 L of straw-colored fluid was  removed. Samples were sent to the laboratory as requested by the clinical team. IMPRESSION: Successful ultrasound-guided therapeutic and diagnostic paracentesis yielding 1.5 liters of peritoneal fluid. Read by: Rushie Nyhan, NP PLAN: The patient has required >/=2 paracenteses in a 30 day period and a formal evaluation by the [facility] has been arranged. Michaelle Birks, MD [facility] Electronically Signed   By: Michaelle Birks M.D.   On: 03/17/2022 14:37    Assessment / Plan:  67 year old male admitted to the hospital with decompensated alcohol associated cirrhosis with ascites, EV, portal hypertension, splenomegaly and abdominal pain. MELD 13. MELD Na 23. Paracentesis 2/27, 1.5 L peritoneal fluid removed without evidence of SBP. Patient developed worsening left sided abdominal pain last night which persisted this morning. Abdominal xray today showed persistent wall thickening of small bowel loops in the left abdomen possibly due to portal hypertensive enteropathy and increased small bowel gas without evidence of obstruction, ileus not excluded. WBC 5.3. LFTs stable.  On Rocephin IV.  He is afebrile.  Hemodynamically stable. -Keep K+ > 4 < 5 and Magnesium 2 - 2.5 to optimize intestinal mobility -Repeat CTAP if abdominal pain worsens -Continue Lasix 40 mg daily and  Aldactone 1 mg daily for now -Clear liquid diet today then advance as tolerated if abdominal pain improves -Pain management per the hospitalist -Await further recommendations per Dr. Fuller Plan   Subclinical hepatic encephalopathy on Xifaxan 500 mg twice daily. No overt HE at this time. -Continue Xifaxan 550 mg 1 p.o. twice daily -Continue Lactulose 10 g p.o. twice daily  Acute on chronic anemia. 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 Chronic black stools on oral iron.  Hg 10.1 -> 9.2.  EGD 03/18/2022 by Dr. Fuller Plan which identified  grade 1 varices in the distal esophagus, moderate portal hypertensive gastropathy, moderate gastric antral vascular ectasia without bleeding in the gastric antrum, fulguration to ablate the lesion to prevent bleeding by APC was successful.  Patient chronically passes black solid or loose stools on oral iron. -Pantoprazole 40 mg twice daily -Carafate 1 g p.o. 3 times daily for 2 weeks  Liver lesion. MR 01/2022 LI-RADS3, somewhat limited eval. FU CT at earlier interval. AFP 6.7 (prev 4.9, 8.1).  Stable on CT 03/16/2022.   History of GERD  Pantoprazole 40 mg p.o. bid  History of tubular adenomatous colon polyps -Next colonoscopy due 11/2023    LOS: 2 days   Noralyn Pick  03/19/2022, 11:56AM   Attending Physician Note   I have taken an interval history, reviewed the chart and examined the patient. I performed a substantive portion of this encounter, including complete performance of at least one of the key components, in conjunction with the APP. I agree with the APP's note, impression and recommendations with my edits. My additional impressions and recommendations are as follows.   Asked to evaluate abdominal pain. Right sided abdominal pain for several weeks however late yesterday, following his EGD, LUQ, LLQ, suprapubic pain started. Abd films show increased SB gas, ileus not excluded. New abdominal pain potentially from gastric irritation post APC or early ileus. Clear liquids for now, start dicyclomine, continue pantoprazole and sucralfate. If symptoms do not improve then repeat CT AP.   Lucio Edward, MD [facility] See AMION, [facility], for our on call provider

## 2022-03-19 NOTE — Progress Notes (Signed)
[facility]                                                                              Kristopher Ramos, is a 67 y.o. male, DOB - 04/11/1955, [id number] Admit date - 03/16/2022    Outpatient Primary MD for the patient is Kristopher Glee., MD  LOS - 2  days  Chief Complaint  Patient presents with   Abdominal Pain       Brief summary   Patient is a 67 year old male with  anxiety, prediabetes, hypertension, wrist fracture, generalized anxiety disorder, GERD, mixed anemia, history of alcohol abuse in remission, alcoholic hepatitis, alcoholic liver cirrhosis, portal hypertension presented to ED with abdominal pain and worsening ascites over the last few days.  He a paracentesis last week and 900 cc of ascitic fluid was removed.  No emesis, melena, hematochezia, states compliance with his medications. In ED, ammonia 52, lipase 71, lactic acid normal.  Troponins x 2 unremarkable.  Sodium 124, potassium 3.6, CO2 24.  Renal function normal.  AST 135, ALT 80, alk phos 223 Chest x-ray showed mild atelectasis, elevation of the diaphragm likely due to ascites CT abdomen showed cirrhosis and multiple stigmata of portal hypertension, ascites volume stable to slightly larger than the previous CT on 02/01/2022   Assessment & Plan    Principal Problem:   Decompensation of cirrhosis of liver (HCC) with worsening ascites, portal hypertensive gastropathy -Ultrasound-guided paracentesis ordered, follow studies. Received Lasix 20 mg IV x 1, albumin 50 g IV x 1 -Placed on IV Rocephin, continue rifaximin, Aldactone, lactulose --Status post ultrasound-guided paracentesis, 1.5 L removed, cultures negative -Resumed oral Lasix   Active Problems: Acute on chronic abdominal pain -Patient complaining of worse abdominal pain today, he does have chronic abdominal pain -Stat abdominal x-ray showed increased small bowel gas without evidence of mechanical obstruction, mild ileus not excluded,  persistent wall thickening of small bowel loops in the left abdomen may reflect portal hypertensive enteropathy -Lipase 66, LFTs improving -Continue pain control, changed to clear liquid diet, GI following -BM yesterday, on lactulose 10 g twice daily  Macrocytic anemia, chronic thrombocytopenia due to alcoholic liver disease -Due to #1, follow counts -B12, folate normal -EGD 2/28 showed grade 1 varices, moderate portal hypertensive gastropathy in the entire stomach, moderate gastric antral vascular ectasia without bleeding in the gastric antrum, diffuse moderately erythematous mucosa without active bleeding in the duodenal bulb and second portion of duodenum consistent with portal adenopathy. -Continue Carafate 1 g 3 times daily, PPI  Acute on chronic hyponatremia -Baseline sodium around 129 -Sodium 127     Generalized anxiety disorder -Continue escitalopram    GERD (gastroesophageal reflux disease) -Continue PPI     Benign essential hypertension -BP stable, continue Aldactone, Lasix    Prediabetes  - CBG stable, diet controlled  Obesity Estimated body mass index is 32.77 kg/m as calculated from the following:   Height as of this encounter: '5\' 6"'$  (1.676 m).   Weight as of this encounter: 92.1 kg.  Code Status: full code  DVT Prophylaxis:  SCDs Start: 03/16/22 1533   Level of Care: Level of care: Med-Surg Family  Communication: Updated patient Disposition Plan:      Remains inpatient appropriate: Pending workup, having abdominal pain today  Procedures:  EGD  Consultants:   GI  Antimicrobials:   Anti-infectives (From admission, onward)    Start     Dose/Rate Route Frequency Ordered Stop   03/16/22 2200  cefTRIAXone (ROCEPHIN) 2 g in sodium chloride 0.9 % 100 mL IVPB        2 g 200 mL/hr over 30 Minutes Intravenous Every 24 hours 03/16/22 1630     03/16/22 2200  rifaximin (XIFAXAN) tablet 550 mg        550 mg Oral 2 times daily 03/16/22 1635     03/16/22 1130   cefTRIAXone (ROCEPHIN) 1 g in sodium chloride 0.9 % 100 mL IVPB        1 g 200 mL/hr over 30 Minutes Intravenous  Once 03/16/22 1126 03/16/22 1212          Medications  ferrous sulfate  325 mg Oral Q breakfast   folic acid  1 mg Oral Daily   furosemide  40 mg Oral Daily   hydrOXYzine  10 mg Oral Daily   lactulose  10 g Oral BID   pantoprazole  40 mg Oral BID   potassium chloride SA  20 mEq Oral Daily   rifaximin  550 mg Oral BID   spironolactone  100 mg Oral QHS   sucralfate  1 g Oral TID AC   thiamine  100 mg Oral Daily      Subjective:   Kristopher Ramos was seen and examined today.  Complaining of worsening abdominal pain, acute on chronic, on no acute nausea or vomiting, no BM today.  No fevers, ongoing nausea vomiting.  Objective:   Vitals:   03/18/22 1343 03/18/22 1508 03/18/22 2146 03/19/22 0608  BP: (!) 109/55 137/61 (!) 122/54 (!) 117/57  Pulse: (!) 58 62 78 66  Resp: '15 18 17 18  '$ Temp:  (!) 97.4 F (36.3 C) 98.1 F (36.7 C) 97.6 F (36.4 C)  TempSrc:  Oral Oral Oral  SpO2: 97% 100% 100% 99%  Weight:      Height:        Intake/Output Summary (Last 24 hours) at 03/19/2022 1052 Last data filed at 03/18/2022 1316 Gross per 24 hour  Intake 200 ml  Output --  Net 200 ml     Wt Readings from Last 3 Encounters:  03/16/22 92.1 kg  02/20/22 89.1 kg  01/30/22 97.5 kg   Physical Exam General: Alert and oriented x 3, uncomfortable Cardiovascular: S1 S2 clear, RRR.  Respiratory: CTAB, no wheezing, rales or rhonchi Gastrointestinal: Soft, diffuse TTP, + distention , NBS Ext: 1+ pedal edema bilaterally Neuro: no new deficits Psych: anxious    Data Reviewed:  I have personally reviewed following labs    CBC Lab Results  Component Value Date   WBC 5.3 03/19/2022   RBC 2.50 (L) 03/19/2022   HGB 9.2 (L) 03/19/2022   HCT 27.5 (L) 03/19/2022   MCV 110.0 (H) 03/19/2022   MCH 36.8 (H) 03/19/2022   PLT 97 (L) 03/19/2022   MCHC 33.5 03/19/2022   RDW  14.3 03/19/2022   LYMPHSABS 1.0 03/16/2022   MONOABS 1.0 03/16/2022   EOSABS 0.2 03/16/2022   BASOSABS 0.1 0000000     Last metabolic panel Lab Results  Component Value Date   NA 127 (L) 03/19/2022   K 3.8 03/19/2022   CL 100 03/19/2022   CO2 20 (L) 03/19/2022  BUN 10 03/19/2022   CREATININE 0.91 03/19/2022   GLUCOSE 104 (H) 03/19/2022   GFRNONAA >60 03/19/2022   GFRAA  01/22/2009    >60        The eGFR has been calculated using the MDRD equation. This calculation has not been validated in all clinical situations. eGFR's persistently <60 mL/min signify possible Chronic Kidney Disease.   CALCIUM 7.5 (L) 03/19/2022   PHOS 3.9 07/21/2020   PHOS 3.9 07/21/2020   PROT 4.8 (L) 03/19/2022   ALBUMIN 2.5 (L) 03/19/2022   BILITOT 1.7 (H) 03/19/2022   ALKPHOS 147 (H) 03/19/2022   AST 99 (H) 03/19/2022   ALT 57 (H) 03/19/2022   ANIONGAP 7 03/19/2022    CBG (last 3)  Recent Labs    03/18/22 1649 03/19/22 0610 03/19/22 0811  GLUCAP 155* 110* 100*      Coagulation Profile: Recent Labs  Lab 03/16/22 0914  INR 1.3*     Radiology Studies: I have personally reviewed the imaging studies  DG Abd Portable 2V  Result Date: 03/19/2022 CLINICAL DATA:  Diffuse abdominal pain. EXAM: PORTABLE ABDOMEN - 2 VIEW COMPARISON:  CT abdomen and pelvis 03/16/2022 FINDINGS: Increased gas is present in multiple loops of mildly prominent but not frankly dilated small bowel in the right mid abdomen. There is again evidence of wall thickening of multiple small bowel loops in the left abdomen. Stool and gas are present in nondilated colon. No intraperitoneal free air is identified. The lung bases are grossly clear. Postsurgical changes are noted in the region of the prostate. IMPRESSION: 1. Increased small bowel gas without evidence of mechanical obstruction. Mild ileus is not excluded. 2. Persistent wall thickening of small bowel loops in the left abdomen which may reflect portal hypertensive  enteropathy. Electronically Signed   By: Logan Bores M.D.   On: 03/19/2022 09:10   IR Paracentesis  Result Date: 03/17/2022 INDICATION: Patient history of alcoholic cirrhosis with recurrent ascites. Request is for therapeutic and diagnostic paracentesis EXAM: ULTRASOUND GUIDED THERAPEUTIC AND DIAGNOSTIC PARACENTESIS MEDICATIONS: Lidocaine 1% 10 mL COMPLICATIONS: None immediate. PROCEDURE: Informed written consent was obtained from the patient after a discussion of the risks, benefits and alternatives to treatment. A timeout was performed prior to the initiation of the procedure. Initial ultrasound scanning demonstrates a small amount of ascites within the left lower abdominal quadrant. The left lower abdomen was prepped and draped in the usual sterile fashion. 1% lidocaine was used for local anesthesia. Following this, a 19 gauge, 5-cm, Yueh catheter was introduced. An ultrasound image was saved for documentation purposes. The paracentesis was performed. The catheter was removed and a dressing was applied. The patient tolerated the procedure well without immediate post procedural complication. FINDINGS: A total of approximately 1.5 L of straw-colored fluid was removed. Samples were sent to the laboratory as requested by the clinical team. IMPRESSION: Successful ultrasound-guided therapeutic and diagnostic paracentesis yielding 1.5 liters of peritoneal fluid. Read by: Rushie Nyhan, NP PLAN: The patient has required >/=2 paracenteses in a 30 day period and a formal evaluation by the [facility] has been arranged. Michaelle Birks, MD [facility] Electronically Signed   By: Michaelle Birks M.D.   On: 03/17/2022 14:37       Camille Thau M.D. [facility] 03/19/2022, 10:52 AM  Available via Epic secure chat 7am-7pm After 7 pm, please refer to night coverage provider listed on amion.

## 2022-03-19 NOTE — Telephone Encounter (Signed)
Chart reviewed Pt still in hospital: Pt scheduled for an office visit with Dr. Lyndel Safe on 03/24/2022 at 10:20 AM.  Orders for labs placed in Epic. Will contact pt once discharged from hospital.

## 2022-03-19 NOTE — Telephone Encounter (Signed)
Chart reviewed. Pt currently admitted to hospital. Will notify pt once discharged.

## 2022-03-20 ENCOUNTER — Other Ambulatory Visit (HOSPITAL_COMMUNITY): Payer: Self-pay

## 2022-03-20 ENCOUNTER — Encounter: Payer: Self-pay | Admitting: Family Medicine

## 2022-03-20 DIAGNOSIS — R1012 Left upper quadrant pain: Secondary | ICD-10-CM

## 2022-03-20 LAB — COMPREHENSIVE METABOLIC PANEL
ALT: 60 U/L — ABNORMAL HIGH (ref 0–44)
AST: 99 U/L — ABNORMAL HIGH (ref 15–41)
Albumin: 2.5 g/dL — ABNORMAL LOW (ref 3.5–5.0)
Alkaline Phosphatase: 140 U/L — ABNORMAL HIGH (ref 38–126)
Anion gap: 7 (ref 5–15)
BUN: 8 mg/dL (ref 8–23)
CO2: 23 mmol/L (ref 22–32)
Calcium: 8.3 mg/dL — ABNORMAL LOW (ref 8.9–10.3)
Chloride: 100 mmol/L (ref 98–111)
Creatinine, Ser: 0.82 mg/dL (ref 0.61–1.24)
GFR, Estimated: >60 mL/min (ref >60)
Glucose, Bld: 109 mg/dL — ABNORMAL HIGH (ref 70–99)
Potassium: 3.8 mmol/L (ref 3.5–5.1)
Sodium: 130 mmol/L — ABNORMAL LOW (ref 135–145)
Total Bilirubin: 2.2 mg/dL — ABNORMAL HIGH (ref 0.3–1.2)
Total Protein: 5.4 g/dL — ABNORMAL LOW (ref 6.5–8.1)

## 2022-03-20 LAB — GLUCOSE, CAPILLARY
Glucose-Capillary: 88 mg/dL (ref 70–99)
Glucose-Capillary: 137 mg/dL — ABNORMAL HIGH (ref 70–99)

## 2022-03-20 LAB — LIPASE, BLOOD: Lipase: 51 U/L (ref 11–51)

## 2022-03-20 LAB — CBC
HCT: 30.5 % — ABNORMAL LOW (ref 39.0–52.0)
Hemoglobin: 10 g/dL — ABNORMAL LOW (ref 13.0–17.0)
MCH: 36.6 pg — ABNORMAL HIGH (ref 26.0–34.0)
MCHC: 32.8 g/dL (ref 30.0–36.0)
MCV: 111.7 fL — ABNORMAL HIGH (ref 80.0–100.0)
Platelets: 91 K/uL — ABNORMAL LOW (ref 150–400)
RBC: 2.73 MIL/uL — ABNORMAL LOW (ref 4.22–5.81)
RDW: 14.3 % (ref 11.5–15.5)
WBC: 4.9 K/uL (ref 4.0–10.5)
nRBC: 0 % (ref 0.0–0.2)

## 2022-03-20 LAB — MAGNESIUM: Magnesium: 2 mg/dL (ref 1.7–2.4)

## 2022-03-20 MED ORDER — LACTULOSE 10 GM/15ML PO SOLN: 10.0000 g | Freq: Two times a day (BID) | ORAL | 6 refills | Status: DC

## 2022-03-20 MED ORDER — THIAMINE HCL 100 MG PO TABS
100.0000 mg | ORAL_TABLET | Freq: Every day | ORAL | 4 refills | Status: AC
  Filled 2022-03-20: qty 30, 30d supply, fill #0

## 2022-03-20 MED ORDER — SUCRALFATE 1 G PO TABS
1.0000 g | ORAL_TABLET | Freq: Three times a day (TID) | ORAL | 1 refills | Status: DC
  Filled 2022-03-20: qty 90, 30d supply, fill #0

## 2022-03-20 MED ORDER — SPIRONOLACTONE 100 MG PO TABS
100.0000 mg | ORAL_TABLET | Freq: Every day | ORAL | 0 refills | Status: AC
  Filled 2022-03-20: qty 31, 31d supply, fill #0

## 2022-03-20 MED ORDER — CITALOPRAM HYDROBROMIDE 40 MG PO TABS
40.0000 mg | ORAL_TABLET | Freq: Every day | ORAL | 0 refills | Status: AC
  Filled 2022-03-20: qty 30, 30d supply, fill #0

## 2022-03-20 MED ORDER — ONDANSETRON HCL 4 MG PO TABS
4.0000 mg | ORAL_TABLET | Freq: Three times a day (TID) | ORAL | 0 refills | Status: AC | PRN
  Filled 2022-03-20: qty 9, 3d supply, fill #0

## 2022-03-20 MED ORDER — DICYCLOMINE HCL 10 MG PO CAPS
10.0000 mg | ORAL_CAPSULE | Freq: Three times a day (TID) | ORAL | 1 refills | Status: DC
  Filled 2022-03-20: qty 90, 23d supply, fill #0
  Filled 2022-04-27: qty 90, 23d supply, fill #1

## 2022-03-20 MED ORDER — FOLIC ACID 1 MG PO TABS
1.0000 mg | ORAL_TABLET | Freq: Every day | ORAL | 4 refills | Status: AC
  Filled 2022-03-20: qty 83, 83d supply, fill #0

## 2022-03-20 MED ORDER — HYDROXYZINE HCL 10 MG PO TABS
10.0000 mg | ORAL_TABLET | Freq: Every day | ORAL | 0 refills | Status: AC
  Filled 2022-03-20: qty 31, 31d supply, fill #0
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
## 2022-03-20 NOTE — Telephone Encounter (Signed)
Chart reviewed. Pt currently admitted to hospital. Will notify pt once discharged.

## 2022-03-20 NOTE — Progress Notes (Addendum)
[facility] Progress Note  CC:  Abdominal pain, decompensated cirrhosis   Subjective: He is feeling much better today.  He stated his left abdominal pain has significantly improved.  He passed a loose dark bowel movement around 4 AM.  No nausea or vomiting.  No chest pain or shortness of breath.   Objective:  Vital signs in last 24 hours: Temp:  [97.9 F (36.6 C)-98.2 F (36.8 C)] 98.2 F (36.8 C) (03/01 0445) Pulse Rate:  [70-75] 70 (03/01 0445) Resp:  [15-18] 15 (03/01 0445) BP: (92-120)/(52-74) 92/52 (03/01 0445) SpO2:  [98 %-100 %] 98 % (03/01 0445) Last BM Date : 03/18/22 General: Alert 67 year old male in no acute distress. Heart: Regular rate and rhythm, no murmurs. Pulm: Breath sounds clear throughout. Abdomen: Soft, nondistended.  Mild tenderness throughout the lower left upper and left lower abdomen without rebound or guarding.  No palpable ascites. + Hepatomegaly.  Positive bowel sounds to all 4 quadrants. Extremities: No edema. Neurologic:  Alert and  oriented x 4. Grossly normal neurologically. Psych:  Alert and cooperative. Normal mood and affect.  Intake/Output from previous day: No intake/output data recorded. Intake/Output this shift: No intake/output data recorded.  Lab Results: Recent Labs    03/18/22 0533 03/19/22 0524  WBC 3.6* 5.3  HGB 9.4* 9.2*  HCT 28.3* 27.5*  PLT 96* 97*   BMET Recent Labs    03/18/22 0533 03/19/22 0524 03/20/22 0609  NA 131* 127* 130*  K 3.8 3.8 3.8  CL 102 100 100  CO2 24 20* 23  GLUCOSE 99 104* 109*  BUN '11 10 8  '$ CREATININE 0.86 0.91 0.82  CALCIUM 8.1* 7.5* 8.3*   LFT Recent Labs    03/20/22 0609  PROT 5.4*  ALBUMIN 2.5*  AST 99*  ALT 60*  ALKPHOS 140*  BILITOT 2.2*   PT/INR No results for input(s): "LABPROT", "INR" in the last 72 hours. Hepatitis Panel No results for input(s): "HEPBSAG", "HCVAB", "HEPAIGM", "HEPBIGM" in the last 72 hours.  DG Abd Portable 2V  Result Date:  03/19/2022 CLINICAL DATA:  Diffuse abdominal pain. EXAM: PORTABLE ABDOMEN - 2 VIEW COMPARISON:  CT abdomen and pelvis 03/16/2022 FINDINGS: Increased gas is present in multiple loops of mildly prominent but not frankly dilated small bowel in the right mid abdomen. There is again evidence of wall thickening of multiple small bowel loops in the left abdomen. Stool and gas are present in nondilated colon. No intraperitoneal free air is identified. The lung bases are grossly clear. Postsurgical changes are noted in the region of the prostate. IMPRESSION: 1. Increased small bowel gas without evidence of mechanical obstruction. Mild ileus is not excluded. 2. Persistent wall thickening of small bowel loops in the left abdomen which may reflect portal hypertensive enteropathy. Electronically Signed   By: Logan Bores M.D.   On: 03/19/2022 09:10    Assessment / Plan:  67 year old male admitted to the hospital with decompensated alcohol associated cirrhosis with ascites, EV, portal hypertension, splenomegaly and abdominal pain. MELD 13. MELD Na 23. Paracentesis 2/27, 1.5 L peritoneal fluid removed without evidence of SBP. Patient developed worsening left sided abdominal 2/29. Abdominal xray showed persistent wall thickening of small bowel loops in the left abdomen possibly due to portal hypertensive enteropathy and increased small bowel gas without evidence of obstruction, ileus not excluded. Abdominal pain significantly reduced today.  T. Bili 1.7 -> 2.2. Alk phos 140. AST 99. ALT 60. Lipase 51. On Rocephin IV.  He is afebrile.  Hemodynamically stable. -2 g low-sodium diet -Continue Dicyclomine 10 mg p.o. every 8 hours, to continue at time of discharge -Keep K+ > 4 < 5 and Magnesium 2 - 2.5 to optimize intestinal mobility -Repeat CTAP if abdominal pain worsens -Continue Lasix 40 mg daily and Aldactone 100 mg daily for now -Discontinue Rocephin after today's dose, first dose received 2/26 -Pain management per the  hospitalist -Patient is scheduled for outpatient follow-up appointment with Dr. Lyndel Safe on 03/24/2022 at 10:20 AM.  CBC, CMP and INR in 1 week in our lab. -Await further recommendations per Dr. 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 No overt HE at this time. -Continue Xifaxan 550 mg 1 p.o. twice daily -Continue Lactulose 10 g p.o. twice daily  Hyponatremia secondary to cirrhosis and diuretics. Na+ 127 -> 130. Normal renal function.    Acute on chronic anemia. 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 Chronic black stools on oral iron.  Hg 10.1 -> 9.2.  EGD 03/18/2022 by Dr. Fuller Plan which identified grade 1 varices in the distal esophagus, moderate portal hypertensive gastropathy, moderate gastric antral vascular ectasia without bleeding in the gastric antrum, fulguration to ablate the lesion to prevent bleeding by APC was successful.  Patient chronically passes black solid or loose stools on oral iron. -Pantoprazole 40 mg twice daily -Carafate 1 g p.o. 3 times daily for 2 weeks   Liver lesion. MR 01/2022 LI-RADS3, somewhat limited eval. FU CT at earlier interval. AFP 6.7 (prev 4.9, 8.1). Stable on CT 03/16/2022.    History of GERD  Pantoprazole 40 mg p.o. bid   History of tubular adenomatous colon polyps -Next colonoscopy due 11/2023     LOS: 3 days   Noralyn Pick  03/20/2022, 8:56AM   Attending Physician Note   I have taken an interval history, reviewed the chart and examined the patient. I performed a substantive portion of this encounter, including complete performance of at least one of the key components, in conjunction with the APP. I agree with the APP's note, impression and recommendations with my edits. My additional impressions and recommendations are as follows.   Abdominal pain significantly improved on dicyclomine 10 mg tid - continue at discharge. Additional recommendations and outpatient follow up as outlined above. OK for discharge  from GI standpoint. GI signing off.   Lucio Edward, MD [facility] See [facility], [facility], for our on call provider

## 2022-03-22 ENCOUNTER — Encounter (HOSPITAL_COMMUNITY): Payer: Self-pay | Admitting: Gastroenterology

## 2022-03-22 LAB — CULTURE, BODY FLUID W GRAM STAIN -BOTTLE: Culture: NO GROWTH

## 2022-03-23 NOTE — Telephone Encounter (Signed)
My chart message sent to pt.

## 2022-03-24 ENCOUNTER — Ambulatory Visit: Payer: 59 | Admitting: Gastroenterology

## 2022-03-25 ENCOUNTER — Other Ambulatory Visit: Payer: Self-pay

## 2022-03-25 DIAGNOSIS — K7460 Unspecified cirrhosis of liver: Secondary | ICD-10-CM

## 2022-03-25 DIAGNOSIS — R188 Other ascites: Secondary | ICD-10-CM

## 2022-03-25 NOTE — Telephone Encounter (Signed)
-   Proceed with US guided LVP (max 7 liters)  If above 5 liters, give 25% at 25 g of albumin IV  If there is prior radiology protocol in place, then please disregard above and go by protocol. -Sent ascitic fluid for cell count, albumin, total protein and cytology. -If fluid is turbid send for culture. -Check weight after paracentesis  RG

## 2022-03-25 NOTE — Telephone Encounter (Signed)
Patient is messaging nurse about scheduling an appointment

## 2022-03-26 ENCOUNTER — Other Ambulatory Visit: Payer: Self-pay | Admitting: Gastroenterology

## 2022-03-26 ENCOUNTER — Ambulatory Visit (HOSPITAL_COMMUNITY)
Admission: RE | Admit: 2022-03-26 | Discharge: 2022-03-26 | Disposition: A | Discharge status: Home / Self Care | Payer: 59 | Source: Ambulatory Visit | Attending: Gastroenterology | Admitting: Gastroenterology

## 2022-03-26 ENCOUNTER — Telehealth: Payer: Self-pay | Admitting: Nurse Practitioner

## 2022-03-26 DIAGNOSIS — K7460 Unspecified cirrhosis of liver: Secondary | ICD-10-CM | POA: Diagnosis present

## 2022-03-26 DIAGNOSIS — R188 Other ascites: Secondary | ICD-10-CM | POA: Insufficient documentation

## 2022-03-26 MED ORDER — LIDOCAINE HCL 1 % IJ SOLN
INTRAMUSCULAR | Status: AC
  Filled 2022-03-26: qty 20

## 2022-03-26 NOTE — Telephone Encounter (Signed)
Patient called requesting to speak with nurse Remo Lipps, patient is requesting a call back to discuss. Please advise.

## 2022-03-26 NOTE — Progress Notes (Signed)
Pt presented to [place] for paracentesis. Upon US examination, there were small pockets of fluid noted to R and L quadrants that were not large enough to safely access.  Exam was ended and explained to patient.  Patient was in agreement with findings.      Narda Rutherford, AGNP-BC 03/26/2022, 1:38 PM

## 2022-03-26 NOTE — Telephone Encounter (Signed)
Patient is requesting a call from [place]. Please advise

## 2022-03-26 NOTE — Telephone Encounter (Signed)
Appointment confirmed for paracentesis: Pt verbalized understanding with all questions answered.

## 2022-03-27 NOTE — Telephone Encounter (Signed)
Pt requested a hospital follow up to see Dr. Lyndel Safe. Pt was scheduled to see Dr. Lyndel Safe on 04/22/2022 at 1:50 PM: Pt made aware. Pt verbalized understanding with all questions answered.

## 2022-03-31 ENCOUNTER — Other Ambulatory Visit: Payer: Self-pay | Admitting: Gastroenterology

## 2022-03-31 DIAGNOSIS — E876 Hypokalemia: Secondary | ICD-10-CM

## 2022-04-01 ENCOUNTER — Telehealth: Payer: Self-pay | Admitting: Gastroenterology

## 2022-04-01 NOTE — Telephone Encounter (Signed)
Inbound call from patient, request to speak to [facility] urgently, patient states he needs a note in regards to stent placement.

## 2022-04-02 NOTE — Telephone Encounter (Signed)
Pt stated that he is requesting a work excuse for this week due to the increase in diarrhea as a result of the Lactulose: Dr. Lyndel Safe was notified and received orders to Provide work note and to notify pt  to decrease lactulose to titrate to 2-3 BMs  a day:  Work note created for pt. Pt made aware. Pt to pick up note on 2nd floor. Pt notified to decrease lactulose to titrate to 2-3 BMs  a day:  Pt verbalized understanding with all questions answered.

## 2022-04-03 ENCOUNTER — Encounter: Payer: Self-pay | Admitting: Gastroenterology

## 2022-04-05 ENCOUNTER — Other Ambulatory Visit: Payer: Self-pay | Admitting: Gastroenterology

## 2022-04-06 ENCOUNTER — Emergency Department (HOSPITAL_BASED_OUTPATIENT_CLINIC_OR_DEPARTMENT_OTHER): Payer: 59

## 2022-04-06 ENCOUNTER — Emergency Department (HOSPITAL_BASED_OUTPATIENT_CLINIC_OR_DEPARTMENT_OTHER)
Admission: EM | Admit: 2022-04-06 | Discharge: 2022-04-07 | Disposition: A | Discharge status: Home / Self Care | Payer: 59 | Attending: Emergency Medicine | Admitting: Emergency Medicine

## 2022-04-06 ENCOUNTER — Encounter (HOSPITAL_BASED_OUTPATIENT_CLINIC_OR_DEPARTMENT_OTHER): Payer: Self-pay

## 2022-04-06 DIAGNOSIS — E871 Hypo-osmolality and hyponatremia: Secondary | ICD-10-CM | POA: Diagnosis not present

## 2022-04-06 DIAGNOSIS — R4182 Altered mental status, unspecified: Secondary | ICD-10-CM | POA: Diagnosis not present

## 2022-04-06 DIAGNOSIS — Z1152 Encounter for screening for COVID-19: Secondary | ICD-10-CM | POA: Insufficient documentation

## 2022-04-06 DIAGNOSIS — R7401 Elevation of levels of liver transaminase levels: Secondary | ICD-10-CM | POA: Insufficient documentation

## 2022-04-06 DIAGNOSIS — R001 Bradycardia, unspecified: Secondary | ICD-10-CM | POA: Insufficient documentation

## 2022-04-06 LAB — COMPREHENSIVE METABOLIC PANEL
ALT: 53 U/L — ABNORMAL HIGH (ref 0–44)
AST: 95 U/L — ABNORMAL HIGH (ref 15–41)
Albumin: 2.9 g/dL — ABNORMAL LOW (ref 3.5–5.0)
Alkaline Phosphatase: 186 U/L — ABNORMAL HIGH (ref 38–126)
Anion gap: 6 (ref 5–15)
BUN: 16 mg/dL (ref 8–23)
CO2: 24 mmol/L (ref 22–32)
Calcium: 8.4 mg/dL — ABNORMAL LOW (ref 8.9–10.3)
Chloride: 93 mmol/L — ABNORMAL LOW (ref 98–111)
Creatinine, Ser: 1.02 mg/dL (ref 0.61–1.24)
GFR, Estimated: >60 mL/min (ref >60)
Glucose, Bld: 175 mg/dL — ABNORMAL HIGH (ref 70–99)
Potassium: 4.5 mmol/L (ref 3.5–5.1)
Sodium: 123 mmol/L — ABNORMAL LOW (ref 135–145)
Total Bilirubin: 2.4 mg/dL — ABNORMAL HIGH (ref 0.3–1.2)
Total Protein: 6.7 g/dL (ref 6.5–8.1)

## 2022-04-06 LAB — URINALYSIS, ROUTINE W REFLEX MICROSCOPIC
Bilirubin Urine: NEGATIVE
Glucose, UA: NEGATIVE mg/dL
Hgb urine dipstick: NEGATIVE
Ketones, ur: NEGATIVE mg/dL
Leukocytes,Ua: NEGATIVE
Nitrite: NEGATIVE
Protein, ur: NEGATIVE mg/dL
Specific Gravity, Urine: 1.015 (ref 1.005–1.030)
pH: 7 (ref 5.0–8.0)

## 2022-04-06 LAB — CBC WITH DIFFERENTIAL/PLATELET
Abs Immature Granulocytes: 0.03 10*3/uL (ref 0.00–0.07)
Basophils Absolute: 0 10*3/uL (ref 0.0–0.1)
Basophils Relative: 1 %
Eosinophils Absolute: 0.3 10*3/uL (ref 0.0–0.5)
Eosinophils Relative: 5 %
HCT: 35.2 % — ABNORMAL LOW (ref 39.0–52.0)
Hemoglobin: 12.1 g/dL — ABNORMAL LOW (ref 13.0–17.0)
Immature Granulocytes: 1 %
Lymphocytes Relative: 16 %
Lymphs Abs: 0.9 10*3/uL (ref 0.7–4.0)
MCH: 35.6 pg — ABNORMAL HIGH (ref 26.0–34.0)
MCHC: 34.4 g/dL (ref 30.0–36.0)
MCV: 103.5 fL — ABNORMAL HIGH (ref 80.0–100.0)
Monocytes Absolute: 0.5 10*3/uL (ref 0.1–1.0)
Monocytes Relative: 10 %
Neutro Abs: 3.8 10*3/uL (ref 1.7–7.7)
Neutrophils Relative %: 67 %
Platelets: 109 10*3/uL — ABNORMAL LOW (ref 150–400)
RBC: 3.4 MIL/uL — ABNORMAL LOW (ref 4.22–5.81)
RDW: 14 % (ref 11.5–15.5)
WBC: 5.6 10*3/uL (ref 4.0–10.5)
nRBC: 0 % (ref 0.0–0.2)

## 2022-04-06 LAB — PROTIME-INR
INR: 1.3 — ABNORMAL HIGH (ref 0.8–1.2)
Prothrombin Time: 15.7 seconds — ABNORMAL HIGH (ref 11.4–15.2)

## 2022-04-06 LAB — RESP PANEL BY RT-PCR (RSV, FLU A&B, COVID)  RVPGX2
Influenza A by PCR: NEGATIVE
Influenza B by PCR: NEGATIVE
Resp Syncytial Virus by PCR: NEGATIVE
SARS Coronavirus 2 by RT PCR: NEGATIVE

## 2022-04-06 LAB — APTT: aPTT: 33 seconds (ref 24–36)

## 2022-04-06 LAB — AMMONIA: Ammonia: 44 umol/L — ABNORMAL HIGH (ref 9–35)

## 2022-04-06 MED ORDER — RIFAXIMIN 550 MG PO TABS
550.0000 mg | ORAL_TABLET | Freq: Two times a day (BID) | ORAL | Status: DC
  Filled 2022-04-06: qty 1

## 2022-04-06 MED ORDER — SUCRALFATE 1 G PO TABS
1.0000 g | ORAL_TABLET | Freq: Three times a day (TID) | ORAL | Status: DC
  Filled 2022-04-06: qty 1

## 2022-04-06 MED ORDER — VANCOMYCIN HCL 500 MG IV SOLR: 500.0000 mg | Freq: Once | INTRAVENOUS | Status: DC

## 2022-04-06 MED ORDER — PIPERACILLIN-TAZOBACTAM 3.375 G IVPB 30 MIN
3.3750 g | Freq: Once | INTRAVENOUS | Status: AC
  Administered 2022-04-06: 3.375 g via INTRAVENOUS
  Filled 2022-04-06: qty 50

## 2022-04-06 MED ORDER — THIAMINE MONONITRATE 100 MG PO TABS: 100.0000 mg | ORAL_TABLET | Freq: Every day | ORAL | Status: DC

## 2022-04-06 MED ORDER — PIPERACILLIN-TAZOBACTAM 3.375 G IVPB
3.3750 g | Freq: Three times a day (TID) | INTRAVENOUS | Status: DC
  Administered 2022-04-06 – 2022-04-07 (×3): 3.375 g via INTRAVENOUS
  Filled 2022-04-06 (×3): qty 50

## 2022-04-06 MED ORDER — VANCOMYCIN HCL 1750 MG/350ML IV SOLN
1750.0000 mg | INTRAVENOUS | Status: DC
  Filled 2022-04-06: qty 350

## 2022-04-06 MED ORDER — VANCOMYCIN HCL 1250 MG/250ML IV SOLN
1250.0000 mg | INTRAVENOUS | Status: DC
  Filled 2022-04-06: qty 250

## 2022-04-06 MED ORDER — MAGNESIUM OXIDE -MG SUPPLEMENT 400 (240 MG) MG PO TABS: 200.0000 mg | ORAL_TABLET | Freq: Every day | ORAL | Status: DC

## 2022-04-06 MED ORDER — SODIUM CHLORIDE 0.9 % IV BOLUS
1000.0000 mL | Freq: Once | INTRAVENOUS | Status: AC
  Administered 2022-04-06: 1000 mL via INTRAVENOUS

## 2022-04-06 MED ORDER — FOLIC ACID 1 MG PO TABS: 1.0000 mg | ORAL_TABLET | Freq: Every day | ORAL | Status: DC

## 2022-04-06 MED ORDER — VANCOMYCIN HCL 500 MG IV SOLR
500.0000 mg | Freq: Once | INTRAVENOUS | Status: AC
  Administered 2022-04-06: 500 mg via INTRAVENOUS

## 2022-04-06 MED ORDER — SPIRONOLACTONE 12.5 MG HALF TABLET: 100.0000 mg | ORAL_TABLET | Freq: Every day | ORAL | Status: DC

## 2022-04-06 MED ORDER — LACTULOSE 10 GM/15ML PO SOLN
30.0000 g | Freq: Four times a day (QID) | ORAL | Status: DC
  Administered 2022-04-06: 30 g via ORAL

## 2022-04-06 MED ORDER — FUROSEMIDE 40 MG PO TABS: 40.0000 mg | ORAL_TABLET | Freq: Every day | ORAL | Status: DC

## 2022-04-06 MED ORDER — CITALOPRAM HYDROBROMIDE 40 MG PO TABS
40.0000 mg | ORAL_TABLET | Freq: Every day | ORAL | Status: DC
  Filled 2022-04-06: qty 1

## 2022-04-06 MED ORDER — VANCOMYCIN HCL IN DEXTROSE 1-5 GM/200ML-% IV SOLN
1000.0000 mg | Freq: Once | INTRAVENOUS | Status: AC
  Administered 2022-04-06: 1000 mg via INTRAVENOUS
  Filled 2022-04-06: qty 200

## 2022-04-06 NOTE — Progress Notes (Addendum)
Pharmacy Antibiotic Note  Kristopher Ramos is a 67 y.o. male admitted on 04/06/2022 with sepsis.  Pharmacy has been consulted for vancomycin and Zosyn dosing. SCr 1.02 and around baseline. CrCl 72 ml/min. WBC 5.6 and afebrile.    Plan: Zosyn 3.375g IV q8h (4 hour infusion). Vancomycin 1500 mg IV once followed by 1250 mg IV Q24H (eAUC 527.1, Scr 1.02, Vd 0.5 for BMI > 30) Monitor renal function and signs/symptoms of infection     Height: 5\' 5"  (165.1 cm) Weight: 83.9 kg (185 lb) IBW/kg (Calculated) : 61.5   Temp (24hrs), Avg:98.1 F (36.7 C), Min:98.1 F (36.7 C), Max:98.1 F (36.7 C)   Last Labs     Recent Labs  Lab 04/06/22 1238  WBC 5.6  CREATININE 1.02      Estimated Creatinine Clearance: 72 mL/min (by C-G formula based on SCr of 1.02 mg/dL).         Allergies  Allergen Reactions   Yellow Jacket Venom Anaphylaxis and Shortness Of Breath      Antimicrobials this admission: Zosyn 3/18 >>  Vancomycin 3/18 >>    Dose adjustments this admission:     Microbiology results:     Thank you for allowing pharmacy to be a part of this patient's care.  Lorelei Pont, PharmD, BCPS 04/06/2022 2:46 PM ED Clinical Pharmacist -  [phone]

## 2022-04-06 NOTE — ED Notes (Signed)
Pt. Has complete dexterity with both hands and feet and arms and legs.  Pt. In no distress with no resp. Distress or confusion noted.

## 2022-04-06 NOTE — ED Notes (Signed)
500 ml urine from in and out cath

## 2022-04-06 NOTE — ED Provider Notes (Signed)
[facility] Provider Note   CSN: [id number] Arrival date & time: 04/06/22  1207     History  Chief Complaint  Patient presents with   Altered Mental Status    Kristopher Ramos is a 67 y.o. male with a history of liver failure on a transplant list at [facility], coronary artery disease status post cardiac catheterization and stenting 04/03/22, on Brilinta, presenting to the ED with worsening confusion.  His wife reports that the patient had his cardiac cath performed on Friday and was doing well today afterwards.  However yesterday he appeared weaker than normal and this morning he is exhausted, difficulty walking, states that he is walking like a drunk person, dropping things, also reporting a headache.  She has been giving him the lactulose 30 mg 4 times a day and he has been taking this and having bowel movements, but there transplant service told him by phone that there may be concern for hepatic encephalopathy she should bring him to the ER.  External record show the patient did have a cardiac cath performed 3 days ago he was noted to have multivessel disease, severe 90% proximal and 80% mid LAD stenosis for which she had stent placement.  Today, the patient's wife at bedside tells me that the [place] transplant coordinator had encouraged her to come to the emergency department, to [facility] if possible. However, this emergency department was in closer proximity.  HPI     Home Medications Prior to Admission medications   Medication Sig Start Date End Date Taking? Authorizing Provider  citalopram (CELEXA) 40 MG tablet Take 1 tablet (40 mg total) by mouth daily. 03/20/22   Nita Sells, MD  dicyclomine (BENTYL) 10 MG capsule Take 1 capsule (10 mg total) by mouth 4 (four) times daily -  before meals and at bedtime. 03/20/22   Nita Sells, MD  EPINEPHrine 0.3 mg/0.3 mL IJ SOAJ injection Inject 0.3 mg into the muscle as needed for anaphylaxis.  11/15/17   [provider]  ferrous sulfate 325 (65 FE) MG tablet Take 1 tablet (325 mg total) by mouth 2 (two) times daily. Patient taking differently: Take 325 mg by mouth at bedtime. 11/24/21   Jackquline Denmark, MD  folic acid (FOLVITE) 1 MG tablet Take 1 tablet (1 mg total) by mouth daily. 03/20/22   Nita Sells, MD  furosemide (LASIX) 40 MG tablet Take 1 tablet (40 mg total) by mouth daily. 02/03/22   Jackquline Denmark, MD  hydrOXYzine (ATARAX) 10 MG tablet Take 1 tablet (10 mg total) by mouth daily. 03/20/22   Nita Sells, MD  lactulose (CHRONULAC) 10 GM/15ML solution TAKE 30ML TWICE A DAY 04/06/22   Jackquline Denmark, MD  Magnesium 250 MG TABS Take 250 mg by mouth daily.    [provider]  ondansetron (ZOFRAN) 4 MG tablet Take 1 tablet (4 mg total) by mouth every 8 (eight) hours as needed for nausea or vomiting. 03/20/22   Nita Sells, MD  pantoprazole (PROTONIX) 40 MG tablet Take 1 tablet (40 mg total) by mouth 2 (two) times daily. Patient taking differently: Take 40 mg by mouth daily before breakfast. 07/30/20   Nita Sells, MD  rifaximin (XIFAXAN) 550 MG TABS tablet Take 1 tablet (550 mg total) by mouth 2 (two) times daily. 12/31/21   Jackquline Denmark, MD  spironolactone (ALDACTONE) 100 MG tablet Take 1 tablet (100 mg total) by mouth at bedtime. 03/20/22   Nita Sells, MD  sucralfate (CARAFATE) 1 g tablet  Take 1 tablet (1 g total) by mouth 3 (three) times daily before meals. 03/20/22   Nita Sells, MD  thiamine (VITAMIN B1) 100 MG tablet Take 1 tablet (100 mg total) by mouth daily. 03/20/22   Nita Sells, MD      Allergies    Yellow jacket venom    Review of Systems   Review of Systems  Physical Exam Updated Vital Signs BP (!) 105/54   Pulse (!) 58   Temp 98.1 F (36.7 C)   Resp 11   Ht 5\' 5"  (1.651 m)   Wt 83.9 kg   SpO2 99%   BMI 30.79 kg/m  Physical Exam  ED Results / Procedures / Treatments   Labs (all labs  ordered are listed, but only abnormal results are displayed) Labs Reviewed  COMPREHENSIVE METABOLIC PANEL - Abnormal; Notable for the following components:      Result Value   Sodium 123 (*)    Chloride 93 (*)    Glucose, Bld 175 (*)    Calcium 8.4 (*)    Albumin 2.9 (*)    AST 95 (*)    ALT 53 (*)    Alkaline Phosphatase 186 (*)    Total Bilirubin 2.4 (*)    All other components within normal limits  CBC WITH DIFFERENTIAL/PLATELET - Abnormal; Notable for the following components:   RBC 3.40 (*)    Hemoglobin 12.1 (*)    HCT 35.2 (*)    MCV 103.5 (*)    MCH 35.6 (*)    Platelets 109 (*)    All other components within normal limits  PROTIME-INR - Abnormal; Notable for the following components:   Prothrombin Time 15.7 (*)    INR 1.3 (*)    All other components within normal limits  AMMONIA - Abnormal; Notable for the following components:   Ammonia 44 (*)    All other components within normal limits  RESP PANEL BY RT-PCR (RSV, FLU A&B, COVID)  RVPGX2  CULTURE, BLOOD (ROUTINE X 2)  CULTURE, BLOOD (ROUTINE X 2)  APTT  URINALYSIS, ROUTINE W REFLEX MICROSCOPIC  LACTIC ACID, PLASMA  LACTIC ACID, PLASMA    EKG EKG Interpretation  Date/Time:  Monday April 06 2022 12:17:09 EDT Ventricular Rate:  62 PR Interval:  175 QRS Duration: 100 QT Interval:  489 QTC Calculation: 497 R Axis:   51 Text Interpretation: Sinus rhythm Borderline prolonged QT interval Confirmed by Octaviano Glow [phone]) on 04/06/2022 12:38:29 PM  Radiology CT Head Wo Contrast  Result Date: 04/06/2022 CLINICAL DATA:  Mental status change, patient keep falling asleep. Confusion since last Tuesday. Cardiac catheterization on Friday. EXAM: CT HEAD WITHOUT CONTRAST TECHNIQUE: Contiguous axial images were obtained from the base of the skull through the vertex without intravenous contrast. RADIATION DOSE REDUCTION: This exam was performed according to the departmental dose-optimization program which includes  automated exposure control, adjustment of the mA and/or kV according to patient size and/or use of iterative reconstruction technique. COMPARISON:  None Available. FINDINGS: Brain: No evidence of acute infarction, hemorrhage, hydrocephalus, extra-axial collection or mass lesion/mass effect. Vascular: No hyperdense vessel or unexpected calcification. Skull: Normal. Negative for fracture or focal lesion. Sinuses/Orbits: No acute finding. Other: None. IMPRESSION: No acute intracranial pathology. Electronically Signed   By: Keane Police D.O.   On: 04/06/2022 13:32   DG Chest Port 1 View  Result Date: 04/06/2022 CLINICAL DATA:  Sepsis EXAM: PORTABLE CHEST 1 VIEW COMPARISON:  Chest x-ray 03/16/22 FINDINGS: Overlapping cardiac leads. Normal cardiopericardial silhouette  without consolidation, pneumothorax, effusion or edema. Underinflation. Eventration of the right hemidiaphragm. IMPRESSION: No acute cardiopulmonary disease Electronically Signed   By: Jill Side M.D.   On: 04/06/2022 13:23    Procedures Procedures    Medications Ordered in ED Medications  lactulose (CHRONULAC) 10 GM/15ML solution 30 g (has no administration in time range)  rifaximin (XIFAXAN) tablet 550 mg (has no administration in time range)  citalopram (CELEXA) tablet 40 mg (has no administration in time range)  folic acid (FOLVITE) tablet 1 mg (has no administration in time range)  furosemide (LASIX) tablet 40 mg (has no administration in time range)  magnesium oxide (MAG-OX) tablet 200 mg (has no administration in time range)  spironolactone (ALDACTONE) tablet 100 mg (has no administration in time range)  thiamine (VITAMIN B1) tablet 100 mg (has no administration in time range)  sucralfate (CARAFATE) tablet 1 g (has no administration in time range)  piperacillin-tazobactam (ZOSYN) IVPB 3.375 g (3.375 g Intravenous New Bag/Given 04/06/22 1531)    Followed by  piperacillin-tazobactam (ZOSYN) IVPB 3.375 g (has no administration in  time range)  vancomycin (VANCOCIN) IVPB 1000 mg/200 mL premix (1,000 mg Intravenous New Bag/Given 04/06/22 1535)  vancomycin (VANCOCIN) 500 mg in sodium chloride 0.9 % 100 mL IVPB (has no administration in time range)  vancomycin (VANCOREADY) IVPB 1750 mg/350 mL (has no administration in time range)  sodium chloride 0.9 % bolus 1,000 mL (1,000 mLs Intravenous New Bag/Given 04/06/22 1357)    ED Course/ Medical Decision Making/ A&P Clinical Course as of 04/06/22 1541  Mon Apr 06, 2022  1350 Patient reassessed remains extremely somnolent.  Does not clear what the cause of this is.  His ammonia is only 44 today and likely not a clear cause of hepatic encephalopathy.  Given that he was recently hospitalized for cath and had cardiac instrumentation I think that sending blood cultures and starting broad-spectrum antibiotics for bacteremia coverage would be reasonable.  I have also asked for In-N-Out catheterization if he cannot provide a urine.  I will reach out to [facility] for a transfer [MT]  1509 We are still awaiting call back from [facility] transfer center.  His blood pressure remains softer while sleeping, will continue to monitor closely [MT]    Clinical Course User Index [MT] Teralyn Mullins, Carola Rhine, MD                             Medical Decision Making Amount and/or Complexity of Data Reviewed Labs: ordered. Radiology: ordered. ECG/medicine tests: ordered.  Risk OTC drugs. Prescription drug management.   This patient presents to the Emergency Department with complaint of altered mental status.  This involves an extensive number of treatment options, and is a complaint that carries with it a high risk of complications and morbidity.  The differential diagnosis includes hypoglycemia vs metabolic encephalopathy vs infection (including cystitis) vs ICH vs stroke vs polypharmacy vs other  I ordered, reviewed, and interpreted labs, including UA without signs of infection.  COVID and flu are negative.   Ammonia level 44, INR 1.3.  White blood cell count within normal limits.  Platelets 109.  Sodium 123, albumin 2.9.  Mild transaminitis.  T. bili 2.4 I ordered medication normal saline infusion for hyponatremia.  Home lactulose and rifaximin doses also ordered.  Given patient's recent hospitalization and instrumentation for cardiac catheterization I think antibiotics are reasonable at this point although he has impending bacteremia or infection rule out.  Will  order broad-spectrum antibiotics with vancomycin and Zosyn.  I ordered imaging studies which included CT scan of the head, x-ray of the chest  I independently visualized and interpreted imaging which showed no emergent fine and the monitor tracing which showed sinus rhythm and sinus bradycardia  Additional history was obtained from the patient's wife at bedside  Previous records obtained and reviewed showing LHC report from [place], [place] transplant service evaluation  I personally reviewed the patients ECG which showed sinus rhythm with no acute ischemic findings.  With no chest pain or discomfort this does not appear consistent with ACS, aortic dissection or PE.  Disposition:  I am attempting to transfer the patient to [facility] where he was recently hospitalized for his cardiac catheterization and for his transplant service  The patient was signed out to Dr Luan Pulling at 3:30 pm awaiting callback from [facility] for anticipated admission.  The [facility] transfer center clarified with me that they are attempting to reach a hospitalist for admission.  The patient remains somnolent, MAP > 65 mmhg, now on antibiotics.  He is not demonstrating evidence of shock at this time or requiring vasopressors.   He is maintaining his airway and oxygen levels on room air.  His wife remains at bedside, and understands we are making efforts to prioritize a transfer to [facility].          Final Clinical Impression(s) / ED Diagnoses Final diagnoses:  Altered  mental status, unspecified altered mental status type    Rx / DC Orders ED Discharge Orders     None         Wyvonnia Dusky, MD 04/06/22 [id number]

## 2022-04-06 NOTE — ED Triage Notes (Signed)
Caregiver reports pt has had confusion since last Tuesday that improved but did not completely go away and now is back again. Per caregiver pt failed stress test so they did a heart cath on Friday at [facility]. Pt is in process of trying to get on liver transplant list.

## 2022-04-06 NOTE — ED Notes (Signed)
Pt. Is awake and up to the restroom at present time.  Pt. Wife is taking the Pt. To the restroom in a w/c  to have a bm and the Pt. Is awake talking asking what the plan of care is.

## 2022-04-06 NOTE — ED Provider Notes (Signed)
3:09 PM Assumed care of patient from off-going team. For more details, please see note from same day.  In brief, this is a 67 y.o. male w/ HTN, HLD, cirrhosis w/ portal hypertensive gastropathy, GERD, GAD, now in known liver failure on transplant list who had cath 3 days with 2 stents placed at [facility] who now p/w AMS. Gets lactulose QID, hasn't missed any doses. No fevers/ no pain. Ammonia 44, na 123 from 127 a few weeks ago, lactate neg, WBC 53. HFP is relatively stable. Neg UA, resp panel. CTH and CXR unremarkable. Blood cultures drawn, getting vanc/zosyn.   Plan/Dispo at time of sign-out & ED Course since sign-out: [ ]  [facility] for transfer   BP (!) 102/53   Pulse (!) 57   Temp 98.1 F (36.7 C)   Resp 15   Ht 5\' 5"  (1.651 m)   Wt 83.9 kg   SpO2 99%   BMI 30.79 kg/m    ED Course:   Clinical Course as of 04/18/22 1556  Mon Apr 06, 2022  1350 Patient reassessed remains extremely somnolent.  Does not clear what the cause of this is.  His ammonia is only 44 today and likely not a clear cause of hepatic encephalopathy.  Given that he was recently hospitalized for cath and had cardiac instrumentation I think that sending blood cultures and starting broad-spectrum antibiotics for bacteremia coverage would be reasonable.  I have also asked for In-N-Out catheterization if he cannot provide a urine.  I will reach out to [facility] for a transfer [MT]  1509 We are still awaiting call back from [facility] transfer center.  His blood pressure remains softer while sleeping, will continue to monitor closely [MT]  1618 Received handoff from Dr. Langston Masker. Awaiting callback from [facility] hospitalist. [HN]  1628 Admitted to stepdown with hospitalist Dr. Illene Bolus at [facility] for [place] [HN]  Tue Apr 07, 2022  1220 [place] for care plan update.  Reviewed current meds administered, plan of care. Facility bed to be available this afternoon. [CC]    Clinical Course User Index [CC] Tretha Sciara, MD [HN] Audley Hose, MD [MT] Langston Masker Carola Rhine, MD    Dispo: Transfer ------------------------------- Cindee Lame, MD Emergency Medicine  This note was created using dictation software, which may contain spelling or grammatical errors.   Audley Hose, MD 04/18/22 [phone]

## 2022-04-06 NOTE — ED Notes (Signed)
Gave Pt. Snacks and oral hydration.  Pt. Wife Roselyn Reef is going home for a while to rest.  Pt. Knows to call out if needs help.

## 2022-04-06 NOTE — ED Notes (Signed)
Pt. Is here after he has had a Cardiac cath on last Friday and now is having lethargy per his wife and unable to ambulate well.  Pt. Is sliding his feet to walk  Pt. Is not having normal dexterity and is more sleepy than normal.  Per the pt. Wife the Pt. Is going to his appts. For a Liver transplant and is on a strict protocol to follow until he has met criteria.    Pt. Has been alcohol free for 2 years and is on lactulose for his liver failure and has increased the doses since his increased lethargy.    Today the Pt. Skin is dusky yellow/greenish and he is dry with no energy.  Pt. [place] is 14 due to he opens eyes to voice.

## 2022-04-06 NOTE — ED Notes (Signed)
Called [facility] or consult and Transfer @14 :08.  Spoke with Margarita Grizzle.  Faxed Face Sheet to her at [phone].  Transferred call to Dr. Langston Masker.

## 2022-04-06 NOTE — Progress Notes (Signed)
Pharmacy Antibiotic Note  Kristopher Ramos is a 67 y.o. male admitted on 04/06/2022 with sepsis.  Pharmacy has been consulted for vancomycin and Zosyn dosing. SCr 1.02 and around baseline. CrCl 72 ml/min. WBC 5.6 and afebrile.   Plan: Zosyn 3.375g IV q8h (4 hour infusion). Vancomycin 1500 mg IV once followed by 1750 mg IV Q24H (eAUC 512, Scr 1.02, Vd 0.72) Monitor renal function and signs/symptoms of infection   Height: 5\' 5"  (165.1 cm) Weight: 83.9 kg (185 lb) IBW/kg (Calculated) : 61.5  Temp (24hrs), Avg:98.1 F (36.7 C), Min:98.1 F (36.7 C), Max:98.1 F (36.7 C)  Recent Labs  Lab 04/06/22 1238  WBC 5.6  CREATININE 1.02    Estimated Creatinine Clearance: 72 mL/min (by C-G formula based on SCr of 1.02 mg/dL).    Allergies  Allergen Reactions   Yellow Jacket Venom Anaphylaxis and Shortness Of Breath    Antimicrobials this admission: Zosyn 3/18 >>  Vancomycin 3/18 >>   Dose adjustments this admission:   Microbiology results:   Thank you for allowing pharmacy to be a part of this patient's care.  Jeneen Rinks [phone] [id number] PM

## 2022-04-07 ENCOUNTER — Telehealth: Payer: Self-pay | Admitting: Gastroenterology

## 2022-04-07 ENCOUNTER — Other Ambulatory Visit: Payer: Self-pay

## 2022-04-07 LAB — BASIC METABOLIC PANEL
Anion gap: 3 — ABNORMAL LOW (ref 5–15)
BUN: 11 mg/dL (ref 8–23)
CO2: 20 mmol/L — ABNORMAL LOW (ref 22–32)
Calcium: 7 mg/dL — ABNORMAL LOW (ref 8.9–10.3)
Chloride: 101 mmol/L (ref 98–111)
Creatinine, Ser: 0.69 mg/dL (ref 0.61–1.24)
GFR, Estimated: >60 mL/min (ref >60)
Glucose, Bld: 111 mg/dL — ABNORMAL HIGH (ref 70–99)
Potassium: 3.7 mmol/L (ref 3.5–5.1)
Sodium: 124 mmol/L — ABNORMAL LOW (ref 135–145)

## 2022-04-07 LAB — CBC WITH DIFFERENTIAL/PLATELET
Abs Immature Granulocytes: 0.02 10*3/uL (ref 0.00–0.07)
Basophils Absolute: 0 10*3/uL (ref 0.0–0.1)
Basophils Relative: 0 %
Eosinophils Absolute: 0.3 10*3/uL (ref 0.0–0.5)
Eosinophils Relative: 5 %
HCT: 27.5 % — ABNORMAL LOW (ref 39.0–52.0)
Hemoglobin: 9.5 g/dL — ABNORMAL LOW (ref 13.0–17.0)
Immature Granulocytes: 0 %
Lymphocytes Relative: 15 %
Lymphs Abs: 0.7 10*3/uL (ref 0.7–4.0)
MCH: 36.4 pg — ABNORMAL HIGH (ref 26.0–34.0)
MCHC: 34.5 g/dL (ref 30.0–36.0)
MCV: 105.4 fL — ABNORMAL HIGH (ref 80.0–100.0)
Monocytes Absolute: 0.9 10*3/uL (ref 0.1–1.0)
Monocytes Relative: 17 %
Neutro Abs: 3.1 10*3/uL (ref 1.7–7.7)
Neutrophils Relative %: 63 %
Platelets: 76 10*3/uL — ABNORMAL LOW (ref 150–400)
RBC: 2.61 MIL/uL — ABNORMAL LOW (ref 4.22–5.81)
RDW: 14.2 % (ref 11.5–15.5)
Smear Review: NORMAL
WBC: 5 10*3/uL (ref 4.0–10.5)
nRBC: 0 % (ref 0.0–0.2)

## 2022-04-07 MED ORDER — VANCOMYCIN HCL IN DEXTROSE 1-5 GM/200ML-% IV SOLN: 1000.0000 mg | Freq: Two times a day (BID) | INTRAVENOUS | Status: DC

## 2022-04-07 NOTE — ED Notes (Signed)
Pt took his own home meds as prescribed:  See Mar for dosage  Iron, ASA, Carvedilol, Plavix, Bentyl, Gabapentin, Protonix

## 2022-04-07 NOTE — Telephone Encounter (Signed)
We received a fax for patient FMLA forms to be filled out. I called patient to inform him he needed to come by the office to fill out 2 forms before Dr.Gupta could do the paperwork, wife answered and I relayed message to her. Wife became upset stating patient is in the hospital and can't come by our office or have someone come as she needs to stay with him at all times. She requested to have them emailed but informed wife that we can not send through email as it contains confidential patient information. She is requesting "this one time for our office to help her". Please advise.

## 2022-04-07 NOTE — Progress Notes (Signed)
Pharmacy Antibiotic Note  Kristopher Ramos is a 67 y.o. male admitted on 04/06/2022 with sepsis.  Pharmacy has been consulted for vancomycin and Zosyn dosing. SCr improved to 0.69.   Plan: Continue Zosyn 3.375g IV q8h (4 hour infusion). Change vancomycin 1g IV Q12H Monitor renal function and signs/symptoms of infection   Height: 5\' 5"  (165.1 cm) Weight: 83.9 kg (185 lb) IBW/kg (Calculated) : 61.5  Temp (24hrs), Avg:98.1 F (36.7 C), Min:98.1 F (36.7 C), Max:98.2 F (36.8 C)  Recent Labs  Lab 04/06/22 1238 04/07/22 1323  WBC 5.6 5.0  CREATININE 1.02 0.69     Estimated Creatinine Clearance: 91.8 mL/min (by C-G formula based on SCr of 0.69 mg/dL).    Allergies  Allergen Reactions   Yellow Jacket Venom Anaphylaxis and Shortness Of Breath    Antimicrobials this admission: Zosyn 3/18 >>  Vancomycin 3/18 >>   Dose adjustments this admission:   Microbiology results: 3/18 Blood - NGTD  Thank you for allowing pharmacy to be a part of this patient's care.  Othon Guardia, Rande Lawman 04/07/2022 3:07 PM

## 2022-04-07 NOTE — ED Notes (Signed)
Pt has been going to the restroom every 15 minutes due to diarrhea.  Pt does not wish to use BSC.  Pt has steady gait and is ambulating well

## 2022-04-07 NOTE — ED Notes (Signed)
[facility] for update on Transfer.  Advised if no Bed available at this time we would need to transfer to ED.  Per Cristie Hem, their ED is full and she is not sure they can accept.  I requested they the ED transfer be made.   I will call back at 4:30pm for update.

## 2022-04-07 NOTE — ED Notes (Addendum)
Pt reports taking following home medications: Folic acid 1mg  K+ 20Meq Gabapentin  300 mg Carafate 1g Bentyl 10 mg

## 2022-04-07 NOTE — ED Notes (Signed)
Dione Housekeeper, Report given to RN at [facility]. Awaiting followup if possible, when [facility] when they're enroute to pickup the patient.

## 2022-04-07 NOTE — ED Notes (Signed)
Both IV sites had leaky sites under the tegaderm and would not pull back. RAC redressed and still would not pull back with repositioning, but flushes without incident. LAC was adjusted, cleaned, and redressed, and pulled back after multiple flushes and site prep. Lab sample pulled off of same.

## 2022-04-07 NOTE — ED Notes (Signed)
[place] for update.  Spoke with Kristopher Ramos she sent message to ED provider for ED transfer.    Will call back at 5pm

## 2022-04-07 NOTE — ED Notes (Signed)
Pt's wife is distraught in the room, on the phone with someone. Complained that staff last night told her "there are sick people here" and "they didn't offer him water. They'd give a dog water, but not him" last night. She was advising that she wishes they could go to [facility] or something instead of waiting on [facility] and insisted that no one understands "the torture" she is going through. Tried to console her, not willing to discuss with me while she was on the phone.   Husband was ambulatory without assistance to the bathroom and his IV was hooked back up.

## 2022-04-07 NOTE — ED Notes (Addendum)
Spoke with pts wife regarding wait for transfer to [facility], expl that not in our control. Also addressed that we are stand alone ED and do not have cafeteria. Pts wife will provide meals. Acknowledges understanding

## 2022-04-07 NOTE — ED Notes (Signed)
[facility] Transfer Center.   Per [place], this has been escalated to Market researcher at [facility] and they are waiting on direction.  She stated he normally responds quickly.  Again advised of situations and need to transfer ED to ED.

## 2022-04-07 NOTE — ED Notes (Signed)
Pt ambulated with standby assist to restroom ?

## 2022-04-07 NOTE — ED Notes (Signed)
[place] @7 :35am and spoke with [place].  She stated hopefully would have a room around noon and they will call with room assignment and report call to number.

## 2022-04-07 NOTE — ED Notes (Addendum)
Update - [facility] will pick patient up.  Called [facility] and advised of transport change.   Landscape architect for Transport.  Spoke with Ander Purpura.   Patient going to [facility] , [address], [place]  [address], [place].  Call report to [phone].  Accepting Physician Shawnie Dapper

## 2022-04-07 NOTE — ED Notes (Signed)
Recliner in patients room for family members comfort  ?

## 2022-04-07 NOTE — ED Notes (Signed)
Called [facility] for consult to Hospitalist for [place] per ED Physician.  Spoke Kim.

## 2022-04-07 NOTE — ED Notes (Signed)
Bedside report given to [place]. Pt ambulatory to the bed.

## 2022-04-08 ENCOUNTER — Encounter: Payer: Self-pay | Admitting: Gastroenterology

## 2022-04-08 NOTE — Telephone Encounter (Signed)
Spoke with Kristopher Ramos and told her I would call her back on March 28th once I get a signature

## 2022-04-08 NOTE — Telephone Encounter (Signed)
Please see note below and assist with FMLA forms.

## 2022-04-11 LAB — CULTURE, BLOOD (ROUTINE X 2)
Culture: NO GROWTH
Culture: NO GROWTH
Special Requests: ADEQUATE
Special Requests: ADEQUATE

## 2022-04-13 NOTE — Telephone Encounter (Signed)
PT is calling to get an update on fmla paperwork.

## 2022-04-13 NOTE — Telephone Encounter (Signed)
Spoke with patient and he has the copy of his FLMA paperwork that was sent. It was attached in mychart under patient message 03-16-2022. He said he can see it and told him sorry about the delay

## 2022-04-13 NOTE — Telephone Encounter (Addendum)
Patient stated he can see his FLMA paperwork via mychart and it was faxed successfully to the company

## 2022-04-14 ENCOUNTER — Encounter: Payer: Self-pay | Admitting: Gastroenterology

## 2022-04-14 NOTE — Telephone Encounter (Signed)
Patient notified that it will be signed tomorrow and faxed

## 2022-04-14 NOTE — Telephone Encounter (Signed)
Patient called stated he needs to have another form filled out for STD and stated it needs to be competed by today.

## 2022-04-15 NOTE — Telephone Encounter (Signed)
Patient made aware and was told if I needed to refax the extended medical form to another number please provide that number

## 2022-04-20 ENCOUNTER — Other Ambulatory Visit: Payer: Self-pay | Admitting: Gastroenterology

## 2022-04-20 DIAGNOSIS — R188 Other ascites: Secondary | ICD-10-CM

## 2022-04-22 ENCOUNTER — Ambulatory Visit (INDEPENDENT_AMBULATORY_CARE_PROVIDER_SITE_OTHER): Payer: 59 | Admitting: Gastroenterology

## 2022-04-22 ENCOUNTER — Encounter: Payer: Self-pay | Admitting: Gastroenterology

## 2022-04-22 ENCOUNTER — Telehealth: Payer: Self-pay

## 2022-04-22 VITALS — BP 118/78 | HR 65 | Ht 66.0 in | Wt 205.0 lb

## 2022-04-22 DIAGNOSIS — D509 Iron deficiency anemia, unspecified: Secondary | ICD-10-CM | POA: Diagnosis not present

## 2022-04-22 DIAGNOSIS — K769 Liver disease, unspecified: Secondary | ICD-10-CM | POA: Diagnosis not present

## 2022-04-22 DIAGNOSIS — K7030 Alcoholic cirrhosis of liver without ascites: Secondary | ICD-10-CM | POA: Diagnosis not present

## 2022-04-22 DIAGNOSIS — K7682 Hepatic encephalopathy: Secondary | ICD-10-CM

## 2022-04-22 NOTE — Telephone Encounter (Signed)
Paracentesis 04-27-2022 at 11am but check in is by 1030am at [facility]. Tried to call patient but it kept ranging busy. Sent mychart message

## 2022-04-22 NOTE — Telephone Encounter (Signed)
Spoke with patient and made him aware and he said he will look on mychart regarding his appointment.
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
## 2022-04-22 NOTE — Patient Instructions (Signed)
_______________________________________________________  If your blood pressure at your visit was 140/90 or greater, please contact your primary care physician to follow up on this.  _______________________________________________________  If you are age 66 or older, your body mass index should be between 23-30. Your Body mass index is 33.09 kg/m. If this is out of the aforementioned range listed, please consider follow up with your Primary Care Provider.  If you are age 60 or younger, your body mass index should be between 19-25. Your Body mass index is 33.09 kg/m. If this is out of the aformentioned range listed, please consider follow up with your Primary Care Provider.   ________________________________________________________  The [facility] providers would like to encourage you to use [facility] to communicate with providers for non-urgent requests or questions.  Due to long hold times on the telephone, sending your provider a message by [facility] may be a faster and more efficient way to get a response.  Please allow 48 business hours for a response.  Please remember that this is for non-urgent requests.  _______________________________________________________  Continue current medications.  Low salt diet  I will be calling in the next couple of days regarding your procedure.  Thank you,  Dr. Jackquline Denmark

## 2022-04-23 NOTE — Telephone Encounter (Signed)
Spoke with wife about this as well and voiced understanding

## 2022-04-27 ENCOUNTER — Ambulatory Visit (HOSPITAL_COMMUNITY)
Admission: RE | Admit: 2022-04-27 | Discharge: 2022-04-27 | Disposition: A | Discharge status: Home / Self Care | Payer: 59 | Source: Ambulatory Visit | Attending: Gastroenterology | Admitting: Gastroenterology

## 2022-04-27 ENCOUNTER — Other Ambulatory Visit (HOSPITAL_COMMUNITY): Payer: Self-pay

## 2022-04-27 ENCOUNTER — Other Ambulatory Visit: Payer: Self-pay | Admitting: Gastroenterology

## 2022-04-27 DIAGNOSIS — K7030 Alcoholic cirrhosis of liver without ascites: Secondary | ICD-10-CM

## 2022-04-27 DIAGNOSIS — D509 Iron deficiency anemia, unspecified: Secondary | ICD-10-CM

## 2022-04-27 DIAGNOSIS — K7682 Hepatic encephalopathy: Secondary | ICD-10-CM

## 2022-04-27 DIAGNOSIS — K769 Liver disease, unspecified: Secondary | ICD-10-CM

## 2022-04-27 NOTE — Progress Notes (Signed)
Patient ID: Kristopher Ramos, male   DOB: 01-Dec-1955, 67 y.o.   MRN: [id number] Pt presented to [place] dept today for paracentesis. On limited US abd in all four quadrants there is only a small amount of ascites present in RLQ which is in close proximity to moving bowel loops precluding safe needle access. Procedure cancelled. Pt updated.

## 2022-04-29 NOTE — Telephone Encounter (Addendum)
Faxed ov and [place] ascites to Florian Buff, RN case Manager at [facility] fax [phone] and telephone [phone] ext [phone] or directly at [phone]. Fax went through successfully and went to scan

## 2022-05-12 ENCOUNTER — Other Ambulatory Visit: Payer: Self-pay

## 2022-05-13 ENCOUNTER — Other Ambulatory Visit: Payer: Self-pay | Admitting: Gastroenterology

## 2022-05-21 ENCOUNTER — Other Ambulatory Visit: Payer: Self-pay | Admitting: Gastroenterology

## 2022-05-21 ENCOUNTER — Other Ambulatory Visit: Payer: Self-pay

## 2022-05-21 DIAGNOSIS — R188 Other ascites: Secondary | ICD-10-CM

## 2022-05-21 MED ORDER — SPIRONOLACTONE 100 MG PO TABS: 100.0000 mg | ORAL_TABLET | Freq: Every day | ORAL | 2 refills | Status: AC

## 2022-05-25 ENCOUNTER — Encounter: Payer: Self-pay | Admitting: Gastroenterology

## 2022-05-25 ENCOUNTER — Telehealth: Payer: Self-pay

## 2022-05-25 ENCOUNTER — Other Ambulatory Visit: Payer: Self-pay

## 2022-05-25 DIAGNOSIS — K7460 Unspecified cirrhosis of liver: Secondary | ICD-10-CM

## 2022-05-25 DIAGNOSIS — K769 Liver disease, unspecified: Secondary | ICD-10-CM

## 2022-05-25 NOTE — Telephone Encounter (Signed)
Order for lab placed in Epic: Left message for pt to call back:    

## 2022-05-25 NOTE — Telephone Encounter (Signed)
-----   Message from Emeline Darling, RN sent at 02/17/2022  8:31 AM EST ----- Regarding: REPEAT AFP Personal reminder placed 02/17/2022   Repeat AFP in 3 months.  He would also be scheduled for triphasic CT liver as recommended by radiology at that time.

## 2022-05-25 NOTE — Telephone Encounter (Signed)
-----   Message from Emeline Darling, RN sent at 02/12/2022  4:17 PM EST ----- Personal Message sent 02/12/2022   Radiology recommends shorter follow-up with CT  Planned triphasic CT liver in 12 weeks.

## 2022-05-25 NOTE — Telephone Encounter (Signed)
Pt was made aware of reminders received in Epic for recommendations for  repeat lab and for CT liver. Pt was notified of the CT that was scheduled on 06/02/2022. Pt stated that he will have to reschedule that date due to being out of town. Number given to Radiology Scheduling [phone].  Pt was notified that order for the lab has been placed in Epic. Location to lab provided.  Pt verbalized understanding with all questions answered.   

## 2022-05-25 NOTE — Telephone Encounter (Signed)
Reminder received in epic.  Ct liver ordered and scheduled for 06/02/2022 at 3:00 PM at [facility]. Pt to arrive at 2:45 PM. Nothing to eat or drink 4 hours prior.  Left message for pt to call back

## 2022-05-25 NOTE — Telephone Encounter (Signed)
Patient returning Steven's call

## 2022-05-25 NOTE — Telephone Encounter (Signed)
Pt was made aware of reminders received in Epic for recommendations for  repeat lab and for CT liver. Pt was notified of the CT that was scheduled on 06/02/2022. Pt stated that he will have to reschedule that date due to being out of town. Number given to Radiology Scheduling [phone].  Pt was notified that order for the lab has been placed in Epic. Location to lab provided.  Pt verbalized understanding with all questions answered.

## 2022-05-26 ENCOUNTER — Other Ambulatory Visit: Payer: 59

## 2022-05-26 DIAGNOSIS — K7460 Unspecified cirrhosis of liver: Secondary | ICD-10-CM

## 2022-05-26 DIAGNOSIS — K769 Liver disease, unspecified: Secondary | ICD-10-CM

## 2022-05-26 NOTE — Telephone Encounter (Signed)
Pl do as before RG

## 2022-05-27 ENCOUNTER — Other Ambulatory Visit: Payer: Self-pay

## 2022-05-27 DIAGNOSIS — R1012 Left upper quadrant pain: Secondary | ICD-10-CM

## 2022-05-27 DIAGNOSIS — R1032 Left lower quadrant pain: Secondary | ICD-10-CM

## 2022-05-27 DIAGNOSIS — R188 Other ascites: Secondary | ICD-10-CM

## 2022-05-27 DIAGNOSIS — K219 Gastro-esophageal reflux disease without esophagitis: Secondary | ICD-10-CM

## 2022-05-27 MED ORDER — SUCRALFATE 1 G PO TABS
1.0000 g | ORAL_TABLET | Freq: Three times a day (TID) | ORAL | 1 refills | Status: DC
Start: 2022-05-27 — End: 2022-07-24

## 2022-05-27 MED ORDER — DICYCLOMINE HCL 10 MG PO CAPS
10.0000 mg | ORAL_CAPSULE | Freq: Three times a day (TID) | ORAL | 1 refills | Status: DC
Start: 2022-05-27 — End: 2022-07-06

## 2022-05-28 ENCOUNTER — Telehealth: Payer: Self-pay | Admitting: Gastroenterology

## 2022-05-28 LAB — AFP TUMOR MARKER: AFP-Tumor Marker: 4.3 ng/mL (ref <6.1)

## 2022-05-28 NOTE — Telephone Encounter (Signed)
Spoke with patient & advised him that labs were still pending. I called and spoke with [facility] & they stated it could take up to 5 days. Advised him we would be in touch once we have results & MD recommendations. Pt verbalized all understanding.

## 2022-05-28 NOTE — Telephone Encounter (Signed)
Inbound call from patient wanting to speak with a nurse in regards to Lab results.Informed patient ,Lab results were not release just yet, but he insist that it can not be possible and want to hear it from a nurse.Please advise.

## 2022-06-02 ENCOUNTER — Ambulatory Visit (HOSPITAL_BASED_OUTPATIENT_CLINIC_OR_DEPARTMENT_OTHER): Payer: 59

## 2022-06-08 ENCOUNTER — Ambulatory Visit (HOSPITAL_BASED_OUTPATIENT_CLINIC_OR_DEPARTMENT_OTHER)
Admission: RE | Admit: 2022-06-08 | Discharge: 2022-06-08 | Disposition: A | Discharge status: Home / Self Care | Payer: 59 | Source: Ambulatory Visit | Attending: Gastroenterology | Admitting: Gastroenterology

## 2022-06-08 DIAGNOSIS — K7460 Unspecified cirrhosis of liver: Secondary | ICD-10-CM | POA: Diagnosis present

## 2022-06-08 DIAGNOSIS — K769 Liver disease, unspecified: Secondary | ICD-10-CM | POA: Diagnosis present

## 2022-06-08 DIAGNOSIS — R188 Other ascites: Secondary | ICD-10-CM | POA: Insufficient documentation

## 2022-06-08 MED ORDER — IOHEXOL 300 MG/ML  SOLN
100.0000 mL | Freq: Once | INTRAMUSCULAR | Status: AC | PRN
  Administered 2022-06-08: 100 mL via INTRAVENOUS

## 2022-07-05 ENCOUNTER — Other Ambulatory Visit: Payer: Self-pay | Admitting: Gastroenterology

## 2022-07-05 DIAGNOSIS — R1032 Left lower quadrant pain: Secondary | ICD-10-CM

## 2022-07-05 DIAGNOSIS — K7460 Unspecified cirrhosis of liver: Secondary | ICD-10-CM

## 2022-07-05 DIAGNOSIS — R1012 Left upper quadrant pain: Secondary | ICD-10-CM

## 2022-07-06 ENCOUNTER — Other Ambulatory Visit: Payer: Self-pay | Admitting: Gastroenterology

## 2022-07-06 DIAGNOSIS — K7460 Unspecified cirrhosis of liver: Secondary | ICD-10-CM

## 2022-07-23 ENCOUNTER — Other Ambulatory Visit: Payer: Self-pay | Admitting: Gastroenterology

## 2022-07-23 DIAGNOSIS — K219 Gastro-esophageal reflux disease without esophagitis: Secondary | ICD-10-CM

## 2022-08-20 ENCOUNTER — Other Ambulatory Visit: Payer: Self-pay | Admitting: Gastroenterology

## 2022-08-26 ENCOUNTER — Other Ambulatory Visit: Payer: Self-pay | Admitting: Gastroenterology

## 2022-08-26 DIAGNOSIS — K219 Gastro-esophageal reflux disease without esophagitis: Secondary | ICD-10-CM

## 2022-08-27 ENCOUNTER — Telehealth: Payer: Self-pay

## 2022-08-27 NOTE — Telephone Encounter (Addendum)
Mailbox was full. I wanted to make sure he is still out of work and his last hospitalization in July was it only for a day or how long was he in the hospital the last time. Dr Chales Abrahams will in the office on Monday so he can sign it then. Mychart sent

## 2022-08-31 NOTE — Telephone Encounter (Signed)
Patient in hospital and has read my message. Will hold off until patient calls back. More than likely they will needs to get FLMA filled out by [facility] but we will see

## 2022-09-10 NOTE — Telephone Encounter (Signed)
Faxed 14 pages with last ov notes and the FLMA paperwork of 2 pages to [phone] and successfully sent

## 2022-09-14 ENCOUNTER — Telehealth: Payer: Self-pay | Admitting: Gastroenterology

## 2022-09-14 NOTE — Telephone Encounter (Signed)
Spoke to patient and gave him the fax number of where it was faxed and it was faxed 09-10-2022 and made him aware

## 2022-09-14 NOTE — Telephone Encounter (Signed)
Inbound call from patient stating paperwork was sent over that was to be filled our for his disability. Patient requesting a call back regarding paperwork. Please advise, thank you.

## 2022-09-16 ENCOUNTER — Other Ambulatory Visit: Payer: Self-pay | Admitting: Gastroenterology

## 2022-09-16 DIAGNOSIS — R188 Other ascites: Secondary | ICD-10-CM

## 2022-09-17 ENCOUNTER — Other Ambulatory Visit: Payer: Self-pay | Admitting: Gastroenterology

## 2022-09-17 DIAGNOSIS — R188 Other ascites: Secondary | ICD-10-CM

## 2022-09-24 ENCOUNTER — Other Ambulatory Visit: Payer: Self-pay | Admitting: Gastroenterology

## 2022-09-25 ENCOUNTER — Telehealth: Payer: Self-pay | Admitting: Gastroenterology

## 2022-09-25 NOTE — Telephone Encounter (Signed)
Inbound call from patients wife stating patient needs a PA for Xifaxin. Patient will be out of medication this weekend and the pharmacy he uses will be closed and is requesting PA to be done ASAP. Please advise.

## 2022-09-28 ENCOUNTER — Encounter: Payer: Self-pay | Admitting: Gastroenterology

## 2022-09-30 ENCOUNTER — Other Ambulatory Visit (HOSPITAL_COMMUNITY): Payer: Self-pay

## 2022-09-30 ENCOUNTER — Telehealth: Payer: Self-pay

## 2022-09-30 NOTE — Telephone Encounter (Signed)
PA request has been Submitted ( I have to call plan because This medication may be excluded from the patient's benefit. For more information, please reach out to [facility] directly at [phone]). New Encounter created for follow up. For additional info see Pharmacy Prior Auth telephone encounter from 09/30/2022.

## 2022-09-30 NOTE — Telephone Encounter (Signed)
Pharmacy Patient Advocate Encounter   Received notification from RX Request Messages that prior authorization for Xifaxan 550MG  tablets is required/requested.   Insurance verification completed.   The patient is insured through [employer] .   PER CMM This medication may be excluded from the patient's benefit. For more information, please reach out to [facility] directly at [phone].

## 2022-09-30 NOTE — Telephone Encounter (Signed)
There is a patient message from 09-28-2022 that said it was approved by our nurse. Did something not go through?

## 2022-10-08 ENCOUNTER — Other Ambulatory Visit (HOSPITAL_COMMUNITY): Payer: Self-pay
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
## 2022-10-08 NOTE — Telephone Encounter (Signed)
Ok

## 2022-10-29 ENCOUNTER — Telehealth: Payer: Self-pay | Admitting: Gastroenterology

## 2022-10-29 MED ORDER — RIFAXIMIN 550 MG PO TABS: 550.0000 mg | ORAL_TABLET | Freq: Two times a day (BID) | ORAL | 3 refills | Status: DC

## 2022-10-29 NOTE — Telephone Encounter (Signed)
Done sent to pharmacy

## 2022-10-29 NOTE — Telephone Encounter (Signed)
DECISION: Approved DRUG Xifaxan Tab 550mg  NAME: Kristopher Ramos PATIENT Member ID: [id number] NAME: Case number: [id number] NEXT You can now fill your prescription for this medication. STEPS: VALID 09/28/2022 - 09/28/2023   Patient calling in regards to xifaxin, please advise.   Thank you

## 2022-10-29 NOTE — Telephone Encounter (Signed)
PT is calling to get refill on Xifaxin but it needs a prior authorization. Refill should be sent to [facility]

## 2022-10-30 ENCOUNTER — Other Ambulatory Visit: Payer: Self-pay

## 2022-10-30 MED ORDER — RIFAXIMIN 550 MG PO TABS: 550.0000 mg | ORAL_TABLET | Freq: Two times a day (BID) | ORAL | 4 refills | Status: AC

## 2022-10-30 MED ORDER — RIFAXIMIN 550 MG PO TABS: 550.0000 mg | ORAL_TABLET | Freq: Two times a day (BID) | ORAL | 3 refills | Status: AC

## 2022-10-30 NOTE — Telephone Encounter (Signed)
[facility] is calling about the denied PA, He needs a PA for 60 day supply.  They need a peer to peer done and the form they sent needs to be sent to insurance company. Please advise.

## 2022-10-30 NOTE — Telephone Encounter (Signed)
Can a PA be done for this patient's Xifaxan ASAP?  ID [id number]  Group [id number] BIN [id number] CPN:[id number] I was told through [facility]. Patient's wife is the Stage manager. Apparently effective 10-08-2022

## 2022-11-02 NOTE — Telephone Encounter (Signed)
PA submitted through covermymeds since no response

## 2022-11-02 NOTE — Telephone Encounter (Addendum)
Request Reference Number: [id number]. XIFAXAN TAB 550MG  is approved through 11/02/2023. Your patient may now fill this prescription and it will be covered.. Authorization Expiration Date: November 02, 2023.  Evonnie Pat ([employer]) Rx #: [id number] Xifaxan 550MG  tablets Form [facility] Electronic Prior Authorization Form (2017 NCPDP)  Made patient aware that his medication is ready and the pharmacy is aware they said that it was already filled this morning and they haven't picked it up yet

## 2022-11-03 NOTE — Telephone Encounter (Signed)
This PA was previously approved on 9.9.24.

## 2022-11-03 NOTE — Telephone Encounter (Signed)
Pharmacy Patient Advocate Encounter  Received notification from [facility] that Prior Authorization for XIFAXAN 550MG  has been APPROVED from 9.9.24 to 9.9.25   PA #/Case ID/Reference #: [id number]

## 2022-12-01 ENCOUNTER — Other Ambulatory Visit: Payer: Self-pay | Admitting: Gastroenterology

## 2022-12-13 ENCOUNTER — Other Ambulatory Visit: Payer: Self-pay | Admitting: Gastroenterology

## 2022-12-13 DIAGNOSIS — R188 Other ascites: Secondary | ICD-10-CM

## 2023-05-02 ENCOUNTER — Other Ambulatory Visit: Payer: Self-pay | Admitting: Gastroenterology
